# Patient Record
Sex: Female | Born: 1935 | Race: White | Hispanic: No | Marital: Married | Smoking: Former smoker
Health system: Southern US, Community
[De-identification: ages and names within clinical notes are randomized; demographics above are authoritative.]

## PROBLEM LIST (undated history)

## (undated) DIAGNOSIS — H269 Unspecified cataract: Secondary | ICD-10-CM

## (undated) DIAGNOSIS — I10 Essential (primary) hypertension: Secondary | ICD-10-CM

## (undated) DIAGNOSIS — I422 Other hypertrophic cardiomyopathy: Secondary | ICD-10-CM

## (undated) DIAGNOSIS — I6529 Occlusion and stenosis of unspecified carotid artery: Secondary | ICD-10-CM

## (undated) DIAGNOSIS — I480 Paroxysmal atrial fibrillation: Secondary | ICD-10-CM

## (undated) DIAGNOSIS — Z9981 Dependence on supplemental oxygen: Secondary | ICD-10-CM

## (undated) DIAGNOSIS — R011 Cardiac murmur, unspecified: Secondary | ICD-10-CM

## (undated) DIAGNOSIS — C4359 Malignant melanoma of other part of trunk: Secondary | ICD-10-CM

## (undated) DIAGNOSIS — I4891 Unspecified atrial fibrillation: Secondary | ICD-10-CM

## (undated) DIAGNOSIS — I509 Heart failure, unspecified: Secondary | ICD-10-CM

## (undated) DIAGNOSIS — I209 Angina pectoris, unspecified: Secondary | ICD-10-CM

## (undated) DIAGNOSIS — R0789 Other chest pain: Secondary | ICD-10-CM

## (undated) DIAGNOSIS — E039 Hypothyroidism, unspecified: Secondary | ICD-10-CM

## (undated) DIAGNOSIS — I499 Cardiac arrhythmia, unspecified: Secondary | ICD-10-CM

## (undated) DIAGNOSIS — I5033 Acute on chronic diastolic (congestive) heart failure: Secondary | ICD-10-CM

## (undated) DIAGNOSIS — I639 Cerebral infarction, unspecified: Secondary | ICD-10-CM

## (undated) DIAGNOSIS — R001 Bradycardia, unspecified: Secondary | ICD-10-CM

## (undated) DIAGNOSIS — K449 Diaphragmatic hernia without obstruction or gangrene: Secondary | ICD-10-CM

## (undated) DIAGNOSIS — E785 Hyperlipidemia, unspecified: Secondary | ICD-10-CM

## (undated) DIAGNOSIS — R0602 Shortness of breath: Secondary | ICD-10-CM

## (undated) DIAGNOSIS — J42 Unspecified chronic bronchitis: Secondary | ICD-10-CM

## (undated) DIAGNOSIS — K219 Gastro-esophageal reflux disease without esophagitis: Secondary | ICD-10-CM

## (undated) DIAGNOSIS — R42 Dizziness and giddiness: Secondary | ICD-10-CM

## (undated) DIAGNOSIS — T8859XA Other complications of anesthesia, initial encounter: Secondary | ICD-10-CM

## (undated) DIAGNOSIS — Z950 Presence of cardiac pacemaker: Secondary | ICD-10-CM

## (undated) DIAGNOSIS — T4145XA Adverse effect of unspecified anesthetic, initial encounter: Secondary | ICD-10-CM

## (undated) DIAGNOSIS — I2510 Atherosclerotic heart disease of native coronary artery without angina pectoris: Secondary | ICD-10-CM

## (undated) DIAGNOSIS — J189 Pneumonia, unspecified organism: Secondary | ICD-10-CM

## (undated) DIAGNOSIS — M2550 Pain in unspecified joint: Secondary | ICD-10-CM

## (undated) DIAGNOSIS — F40240 Claustrophobia: Secondary | ICD-10-CM

## (undated) DIAGNOSIS — I739 Peripheral vascular disease, unspecified: Secondary | ICD-10-CM

## (undated) DIAGNOSIS — M1990 Unspecified osteoarthritis, unspecified site: Secondary | ICD-10-CM

## (undated) HISTORY — DX: Essential (primary) hypertension: I10

## (undated) HISTORY — DX: Unspecified cataract: H26.9

## (undated) HISTORY — DX: Other chest pain: R07.89

## (undated) HISTORY — DX: Dizziness and giddiness: R42

## (undated) HISTORY — DX: Peripheral vascular disease, unspecified: I73.9

## (undated) HISTORY — DX: Occlusion and stenosis of unspecified carotid artery: I65.29

## (undated) HISTORY — DX: Cardiac murmur, unspecified: R01.1

## (undated) HISTORY — PX: DILATION AND CURETTAGE OF UTERUS: SHX78

## (undated) HISTORY — PX: EYE SURGERY: SHX253

## (undated) HISTORY — DX: Hyperlipidemia, unspecified: E78.5

## (undated) HISTORY — DX: Unspecified osteoarthritis, unspecified site: M19.90

## (undated) HISTORY — DX: Heart failure, unspecified: I50.9

## (undated) HISTORY — DX: Pain in unspecified joint: M25.50

## (undated) HISTORY — PX: TONSILLECTOMY: SUR1361

## (undated) HISTORY — DX: Gastro-esophageal reflux disease without esophagitis: K21.9

## (undated) HISTORY — PX: MICROLARYNGOSCOPY WITH CO2 LASER AND EXCISION OF VOCAL CORD LESION: SHX5970

## (undated) HISTORY — DX: Diaphragmatic hernia without obstruction or gangrene: K44.9

---
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
## 1959-06-23 DIAGNOSIS — C4359 Malignant melanoma of other part of trunk: Secondary | ICD-10-CM

## 1959-06-23 HISTORY — PX: MELANOMA EXCISION: SHX5266

## 1959-06-23 HISTORY — DX: Malignant melanoma of other part of trunk: C43.59

## 1969-02-20 HISTORY — PX: ABDOMINAL HYSTERECTOMY: SHX81

## 1981-06-22 HISTORY — PX: MICROLARYNGOSCOPY WITH CO2 LASER AND EXCISION OF VOCAL CORD LESION: SHX5970

## 1989-02-20 HISTORY — PX: CAROTID ENDARTERECTOMY: SUR193

## 1989-06-22 HISTORY — PX: ABDOMINAL SURGERY: SHX537

## 1998-09-16 ENCOUNTER — Encounter: Payer: Self-pay | Admitting: Cardiology

## 1998-09-16 ENCOUNTER — Ambulatory Visit (HOSPITAL_COMMUNITY): Admission: RE | Admit: 1998-09-16 | Discharge: 1998-09-16 | Payer: Self-pay | Admitting: Cardiology

## 1998-09-23 ENCOUNTER — Observation Stay (HOSPITAL_COMMUNITY): Admission: AD | Admit: 1998-09-23 | Discharge: 1998-09-24 | Payer: Self-pay | Admitting: Cardiology

## 1998-10-01 ENCOUNTER — Inpatient Hospital Stay (HOSPITAL_COMMUNITY): Admission: RE | Admit: 1998-10-01 | Discharge: 1998-10-03 | Payer: Self-pay | Admitting: *Deleted

## 1999-02-21 HISTORY — PX: CATARACT EXTRACTION W/ INTRAOCULAR LENS  IMPLANT, BILATERAL: SHX1307

## 2000-09-30 ENCOUNTER — Ambulatory Visit (HOSPITAL_COMMUNITY): Admission: RE | Admit: 2000-09-30 | Discharge: 2000-09-30 | Payer: Self-pay | Admitting: Gastroenterology

## 2002-10-04 ENCOUNTER — Encounter: Payer: Self-pay | Admitting: Cardiology

## 2002-10-04 ENCOUNTER — Encounter: Admission: RE | Admit: 2002-10-04 | Discharge: 2002-10-04 | Payer: Self-pay | Admitting: Cardiology

## 2002-10-09 ENCOUNTER — Ambulatory Visit (HOSPITAL_COMMUNITY): Admission: RE | Admit: 2002-10-09 | Discharge: 2002-10-10 | Payer: Self-pay | Admitting: Cardiology

## 2002-10-23 ENCOUNTER — Ambulatory Visit (HOSPITAL_COMMUNITY): Admission: RE | Admit: 2002-10-23 | Discharge: 2002-10-23 | Payer: Self-pay | Admitting: Cardiology

## 2002-11-22 ENCOUNTER — Ambulatory Visit (HOSPITAL_COMMUNITY): Admission: RE | Admit: 2002-11-22 | Discharge: 2002-11-22 | Payer: Self-pay | Admitting: Cardiology

## 2002-11-22 ENCOUNTER — Encounter: Payer: Self-pay | Admitting: Cardiology

## 2003-12-20 ENCOUNTER — Ambulatory Visit (HOSPITAL_COMMUNITY): Admission: RE | Admit: 2003-12-20 | Discharge: 2003-12-20 | Payer: Self-pay | Admitting: Gastroenterology

## 2004-11-03 ENCOUNTER — Encounter: Admission: RE | Admit: 2004-11-03 | Discharge: 2004-11-03 | Payer: Self-pay | Admitting: Cardiology

## 2004-11-07 ENCOUNTER — Ambulatory Visit (HOSPITAL_COMMUNITY): Admission: RE | Admit: 2004-11-07 | Discharge: 2004-11-07 | Payer: Self-pay | Admitting: Cardiology

## 2007-02-10 ENCOUNTER — Ambulatory Visit: Payer: Self-pay | Admitting: *Deleted

## 2007-09-13 HISTORY — PX: CARDIOVASCULAR STRESS TEST: SHX262

## 2008-02-09 ENCOUNTER — Ambulatory Visit: Payer: Self-pay | Admitting: *Deleted

## 2008-02-09 DIAGNOSIS — R0789 Other chest pain: Secondary | ICD-10-CM

## 2008-02-09 HISTORY — DX: Other chest pain: R07.89

## 2008-06-13 ENCOUNTER — Ambulatory Visit (HOSPITAL_COMMUNITY): Admission: RE | Admit: 2008-06-13 | Discharge: 2008-06-13 | Payer: Self-pay | Admitting: Emergency Medicine

## 2008-06-13 ENCOUNTER — Encounter: Payer: Self-pay | Admitting: Emergency Medicine

## 2008-06-13 ENCOUNTER — Ambulatory Visit: Payer: Self-pay | Admitting: *Deleted

## 2008-11-20 ENCOUNTER — Encounter: Admission: RE | Admit: 2008-11-20 | Discharge: 2008-11-20 | Payer: Self-pay | Admitting: Cardiology

## 2008-12-04 ENCOUNTER — Inpatient Hospital Stay (HOSPITAL_COMMUNITY): Admission: RE | Admit: 2008-12-04 | Discharge: 2008-12-05 | Payer: Self-pay | Admitting: Cardiology

## 2009-01-09 ENCOUNTER — Encounter (HOSPITAL_COMMUNITY): Admission: RE | Admit: 2009-01-09 | Discharge: 2009-03-21 | Payer: Self-pay | Admitting: Cardiology

## 2009-02-14 ENCOUNTER — Ambulatory Visit: Payer: Self-pay | Admitting: Vascular Surgery

## 2009-10-14 ENCOUNTER — Ambulatory Visit: Payer: Self-pay | Admitting: Family Medicine

## 2009-10-14 ENCOUNTER — Ambulatory Visit: Payer: Self-pay | Admitting: Cardiology

## 2009-10-14 ENCOUNTER — Encounter: Payer: Self-pay | Admitting: Family Medicine

## 2009-10-14 ENCOUNTER — Inpatient Hospital Stay (HOSPITAL_COMMUNITY): Admission: EM | Admit: 2009-10-14 | Discharge: 2009-10-17 | Payer: Self-pay | Admitting: Emergency Medicine

## 2009-10-16 ENCOUNTER — Encounter: Payer: Self-pay | Admitting: Family Medicine

## 2009-10-16 ENCOUNTER — Encounter (INDEPENDENT_AMBULATORY_CARE_PROVIDER_SITE_OTHER): Payer: Self-pay | Admitting: Cardiology

## 2009-10-16 ENCOUNTER — Ambulatory Visit: Payer: Self-pay | Admitting: Vascular Surgery

## 2009-10-21 ENCOUNTER — Encounter: Payer: Self-pay | Admitting: *Deleted

## 2010-04-04 ENCOUNTER — Ambulatory Visit: Payer: Self-pay | Admitting: Vascular Surgery

## 2010-04-21 ENCOUNTER — Encounter: Admission: RE | Admit: 2010-04-21 | Discharge: 2010-04-21 | Payer: Self-pay | Admitting: Cardiology

## 2010-06-22 DIAGNOSIS — I639 Cerebral infarction, unspecified: Secondary | ICD-10-CM

## 2010-06-22 HISTORY — DX: Cerebral infarction, unspecified: I63.9

## 2010-07-24 NOTE — Miscellaneous (Signed)
Summary: hosp f/u  Clinical Lists Changes LM for her or family to call me back.Golden Circle RN  Oct 21, 2009 4:26 PM  spoke with spouse. she is doing "fine" has had 2 appts with md since discharge. no need for one here.Golden Circle RN  Oct 31, 2009 10:37 AM

## 2010-09-09 LAB — BASIC METABOLIC PANEL
BUN: 27 mg/dL — ABNORMAL HIGH (ref 6–23)
CO2: 25 mEq/L (ref 19–32)
Calcium: 8.9 mg/dL (ref 8.4–10.5)
Chloride: 104 mEq/L (ref 96–112)
Chloride: 104 mEq/L (ref 96–112)
Chloride: 107 mEq/L (ref 96–112)
GFR calc Af Amer: 39 mL/min — ABNORMAL LOW (ref >60)
GFR calc Af Amer: 40 mL/min — ABNORMAL LOW (ref >60)
GFR calc Af Amer: 50 mL/min — ABNORMAL LOW (ref >60)
GFR calc non Af Amer: 32 mL/min — ABNORMAL LOW (ref >60)
GFR calc non Af Amer: 41 mL/min — ABNORMAL LOW (ref >60)
Glucose, Bld: 97 mg/dL (ref 70–99)
Glucose, Bld: 98 mg/dL (ref 70–99)
Potassium: 5.3 mEq/L — ABNORMAL HIGH (ref 3.5–5.1)
Sodium: 136 mEq/L (ref 135–145)
Sodium: 136 mEq/L (ref 135–145)

## 2010-09-09 LAB — LIPID PANEL
Cholesterol: 247 mg/dL — ABNORMAL HIGH (ref 0–200)
HDL: 37 mg/dL — ABNORMAL LOW (ref >39)
Total CHOL/HDL Ratio: 6.7 RATIO
VLDL: 54 mg/dL — ABNORMAL HIGH (ref 0–40)

## 2010-09-09 LAB — CBC
HCT: 36.7 % (ref 36.0–46.0)
Hemoglobin: 11.8 g/dL — ABNORMAL LOW (ref 12.0–15.0)
Hemoglobin: 12.5 g/dL (ref 12.0–15.0)
MCHC: 34 g/dL (ref 30.0–36.0)
MCHC: 34.1 g/dL (ref 30.0–36.0)
MCV: 92.7 fL (ref 78.0–100.0)
Platelets: 180 10*3/uL (ref 150–400)
RBC: 3.73 MIL/uL — ABNORMAL LOW (ref 3.87–5.11)
RBC: 4.05 MIL/uL (ref 3.87–5.11)
RDW: 14.5 % (ref 11.5–15.5)
RDW: 14.8 % (ref 11.5–15.5)
RDW: 15 % (ref 11.5–15.5)
WBC: 6.3 10*3/uL (ref 4.0–10.5)

## 2010-09-09 LAB — URINALYSIS, ROUTINE W REFLEX MICROSCOPIC
Bilirubin Urine: NEGATIVE
Hgb urine dipstick: NEGATIVE
Ketones, ur: NEGATIVE mg/dL
Nitrite: NEGATIVE
Urobilinogen, UA: 0.2 mg/dL (ref 0.0–1.0)

## 2010-09-09 LAB — POCT CARDIAC MARKERS
CKMB, poc: 1.5 ng/mL (ref 1.0–8.0)
Myoglobin, poc: 93.1 ng/mL (ref 12–200)

## 2010-09-09 LAB — HEMOGLOBIN A1C: Hgb A1c MFr Bld: 5.9 % — ABNORMAL HIGH (ref <5.7)

## 2010-09-29 LAB — CARDIAC PANEL(CRET KIN+CKTOT+MB+TROPI)
CK, MB: 2.3 ng/mL (ref 0.3–4.0)
Relative Index: INVALID (ref 0.0–2.5)
Total CK: 63 U/L (ref 7–177)

## 2010-09-29 LAB — CBC
MCHC: 33 g/dL (ref 30.0–36.0)
MCV: 91.9 fL (ref 78.0–100.0)
RDW: 14.5 % (ref 11.5–15.5)
WBC: 6.6 10*3/uL (ref 4.0–10.5)

## 2010-09-29 LAB — BASIC METABOLIC PANEL
CO2: 30 mEq/L (ref 19–32)
Chloride: 107 mEq/L (ref 96–112)
Creatinine, Ser: 1.07 mg/dL (ref 0.4–1.2)
GFR calc Af Amer: >60 mL/min (ref >60)
Sodium: 141 mEq/L (ref 135–145)

## 2010-11-04 NOTE — Cardiovascular Report (Signed)
NAMESHARMEL, BALLANTINE            ACCOUNT NO.:  [id number]   MEDICAL RECORD NO.:  [id number]          PATIENT TYPE:  INP   LOCATION:  2506                         FACILITY:  [facility]   PHYSICIAN:  Thereasa Solo. Little, M.D. DATE OF BIRTH:  June 23, 1927   DATE OF PROCEDURE:  12/04/2008  DATE OF DISCHARGE:                            CARDIAC CATHETERIZATION   INDICATIONS FOR TEST:  This 77 year old female has known coronary artery  disease.  She had a stent placed to her diagonal and 1 to her circumflex  in the remote past.  She has a normal ejection fraction, had a nuclear  study in March 2009 that showed inferior lateral ischemia and she was  asymptomatic.  Unfortunately, she is now developed chest pain and  worsening resting EKG changes.  She is brought in for outpatient cardiac  catheterization.   Her chest pain is more, upper back discomfort that runs between the  scapula, it usually occurs at rest and late in the evening, it is not  associated with diaphoresis.  There is sometimes shortness of breath  with this and I am not convinced this is an anginal equivalent, but with  the ischemic changes on the nuclear study and her changes on her  cardiogram, I brought her in for cath.   After obtaining informed consent, the patient was prepped and draped in  the usual sterile fashion exposing the right groin.  Following local  anesthetic with 1% Xylocaine, the Seldinger technique was employed, and  a 5-French introducer sheath was placed in the right femoral artery.  A  SmartNeedle was used for arterial access.  Left and right coronary  arteriography and ventriculography was performed in the RAO projection.  Following this, intervention to 3 different areas was performed.   COMPLICATIONS:  None.   TOTAL CONTRAST:  315 mL   MEDICATIONS:  IV Angiomax with an ACT of 396 prior to starting  intervention and Versed 2 mg IV.   RESULTS:  1. Hemodynamic monitoring:  Her central aortic pressure was  163/57.      Her left ventricular pressure was 167/10.  The left ventricular end-      diastolic pressure was 28.  2. Ventriculography:  Ventriculography in the RAO projection using 20      mL of contrast at 12 mL per second revealed the left ventricle to      be hyperdynamic with an ejection fraction excess of 75%, no mitral      regurgitation was appreciated.  3. Coronary arteriography:  On fluoroscopy, calcification was seen in      the aortic root in the LAD and the left main and there was dense      mitral annular calcification.      a.     Left main normal and bifurcated.      b.     Circumflex.  The circumflex was a large dominant vessel.  It       gave rise to a PDA and multiple OM vessels.  In the proximal       portion of the circumflex was a stent, within this stented  area       was a hazy area that probably represented a plaque rupture with       some thrombus.  There was only a 40% area of narrowing, but the       haziness was of significant concern.  The ostium of the OM #2,       which was slightly distal to the stent had an 80% area of       narrowing and the vessel was about 1.5 mm in diameter.  The       remainder of the circ system had only mild irregularities.      c.     LAD.  The LAD extended to towards the apex of the heart.  It       had only minimal irregularities.  The first diagonal was a small       vessel about 2 mm.  There was a stent in the midportion of this       vessel that was widely patent.  The ostium of the diagonal,       however, was approximately 70% narrowed and it was very difficult       to adequately visualize this ostial segment.      d.     Right coronary artery.  The right coronary artery was a       nondominant vessel, 100% occluded in its midportion, and this is       unchanged from prior cath.   Arrangements were made for intervention.  She was given IV Angiomax  access obtained an ACT of 396.  The introducer sheath was upgraded to a   6-French system and a JL3.5, 6-French guide, and a Luge wire was used.  1. Cutting balloon to in-stent stenosis in the circumflex stented      area.  A 2.5 x 10 cutting balloon was placed within the body of the      stent, making sure that it did not extend past the stent border.  A      total of 3 inflations 12 x 45, 12 x 46, and 12 x 49 was performed.      This area appeared to be normal post PCI and there was no evidence      of any dissection or distal embolization or thrombus formation.  2. PCI to the ostium of OM #2.  There was 80% narrowing.  A 1.5 x 12      apex balloon was used.  Two inflations 8 x 47 and 9 x 48 was      performed.  This area was less than 20% narrowed post PCI.  I did      not have a cutting balloon small enough.  Therefore, the apex was      used.  3. PCI to the ostium of the first diagonal, which was 70% narrowed.  A      2.0 x 12 cutting balloon was used.  Three inflations 8 x 30, 8 x      50, and 8 x 31 was performed.  The area was less than 20% narrowed      post intervention.  There was no evidence of any dissection or      thrombus formation, and the LAD around the ostium appeared to be      normal also.   I gave the patient 200 mcg of intracoronary nitroglycerin following the  procedure.  I plan to hydrate  her overnight.  Her sheath will be removed  later today.  I plan to check a BMP in the morning.  Her blood pressure  at the end of the procedure was 190/82 and I started her on IV  nitroglycerin for blood pressure control.           ______________________________  Thereasa Solo. Little, M.D.     ABL/MEDQ  D:  12/04/2008  T:  12/05/2008  Job:  [id number]   cc:   Cath Lab  Harrel Lemon. Merla Riches, M.D.

## 2010-11-04 NOTE — Procedures (Signed)
CAROTID DUPLEX EXAM   INDICATION:  Follow up carotid artery disease.   HISTORY:  Diabetes:  No.  Cardiac:  CHF, PTCA and stent in 2004.  Hypertension:  Yes.  Smoking:  No.  Previous Surgery:  A left CEA with DPA on 09/30/2009 by Dr. Madilyn Fireman.  CV History:  CVA 10/13/2009.  Amaurosis Fugax , Paresthesias , Hemiparesis                                       RIGHT             LEFT  Brachial systolic pressure:         162               164  Brachial Doppler waveforms:         WNL               WNL  Vertebral direction of flow:        Antegrade         Antegrade  DUPLEX VELOCITIES (cm/sec)  CCA peak systolic                   66                96  ECA peak systolic                   226               96  ICA peak systolic                   167               93  ICA end diastolic                   32                21  PLAQUE MORPHOLOGY:                  Heterogenous      Focal plaquing of  the proximal ICA  PLAQUE AMOUNT:                      Moderate          Mild  PLAQUE LOCATION:                    ICA, ECA, CCA     CCA   IMPRESSION:  1. Right internal carotid artery shows evidence of 40% to 59%      stenosis.  2. Left internal carotid artery shows evidence of 1% to 39% stenosis.   ___________________________________________  Di Kindle. Edilia Bo, M.D.   OD/MEDQ  D:  04/04/2010  T:  04/04/2010  Job:  [id number]

## 2010-11-04 NOTE — Procedures (Signed)
CAROTID DUPLEX EXAM   INDICATION:  Followup carotid artery disease.   HISTORY:  Diabetes:  no  Cardiac:  CHF, PTCA and stent in 2004  Hypertension:  yes  Smoking:  no  Previous Surgery:  Left CEA with DPA 10/01/1998 by Dr. Madilyn Fireman  CV History:  Rarely vertigo, asymptomatic now  Amaurosis Fugax  No, Paresthesias No, Hemiparesis No                                       RIGHT             LEFT  Brachial systolic pressure:         158               160  Brachial Doppler waveforms:         Within normal limits                Within normal limits  Vertebral direction of flow:        antegrade         antegrade  DUPLEX VELOCITIES (cm/sec)  CCA peak systolic                   70                156  ECA peak systolic                   280               165  ICA peak systolic                   197               89  ICA end diastolic                   22                23  PLAQUE MORPHOLOGY:                  Calcific with shadow                homogenous  PLAQUE AMOUNT:                      moderate          mild  PLAQUE LOCATION:                    ICA/ ECA/ CCA     Distal CCA   IMPRESSION:  1. Right internal carotid artery shows evidence of 40 to 59% stenosis      (high end of range).  2. Left internal carotid artery shows evidence of 1 to 39% stenosis.  3. Right external carotid artery stenosis.  4. No significant changes from previous study.   ___________________________________________  Di Kindle. Edilia Bo, M.D.   AS/MEDQ  D:  02/14/2009  T:  02/14/2009  Job:  [id number]

## 2010-11-04 NOTE — Discharge Summary (Signed)
Carla Black, Carla Black            ACCOUNT NO.:  [id number]   MEDICAL RECORD NO.:  [id number]          PATIENT TYPE:  INP   LOCATION:  2506                         FACILITY:  [facility]   PHYSICIAN:  Thereasa Solo. Little, M.D. DATE OF BIRTH:  December 07, 1927   DATE OF ADMISSION:  12/04/2008  DATE OF DISCHARGE:  12/05/2008                               DISCHARGE SUMMARY   DISCHARGE DIAGNOSES:  1. Angina occurring over the last 4 weeks with a mildly positive      stress test in March 2009 but this was the first time of her      angina.  2. Coronary artery disease with history of stent to the diagonal and      circumflex.      a.     Cutting balloon for in-stent restenosis to the circumflex.      b.     Angioplasty to the ostium of the obtuse marginal #2.      c.     Angioplasty to the ostium of the first diagonal.  3. Chronic dyspnea on exertion.  4. Hyperlipidemia.  5. Chronic lower extremity edema.  6. Difficulty with statins but with elevated LDL and continued      disease.  We will change to Crestor for better control.  7. Hypothyroidism, on replacement.   DISCHARGE CONDITION:  Improved.   PROCEDURES:  1. Combined left heart cath on December 04, 2008, by Dr. Clarene Duke.  2. December 04, 2008, cutting balloon for in-stent restenosis to the      circumflex, angioplasty of the ostium of the OM-2, angioplasty      ostium of the first diagonal by Dr. Clarene Duke.   DISCHARGE MEDICATIONS:  1. Plavix 75 mg daily.  Do not stop Plavix for any GI procedure for at      least 90 days.  2. Omeprazole 20 mg daily.  3. Quinapril, decrease dose to 10 mg daily.  4. Synthroid 100 mg daily.  5. Isosorbide mononitrate 60 mg daily.  6. Stop Zocor and instead use Crestor 20 mg daily.  7. Tiazac 360 mg daily.  8. Toprol, increase the dose to 75 mg daily.  9. Aspirin 81 mg, take 2 daily.  10.Lasix 60 mg as needed as before.  11.Potassium 20 mEq as needed as before.  12.Nitroglycerin sublingual as needed for chest pain 1  every 5 minutes      up to 3 every 15, if no relief call 911.   DISCHARGE INSTRUCTIONS:  1. Low-sodium, heart-healthy diet.  2. Wash cath site with soap and water.  Call if any bleeding,      swelling, or drainage.  3. Increase activity slowly.  No lifting for 1 week.  4. No driving for 3 days.  Follow up with Dr. Clarene Duke, December 26, 2008, at      11:20 a.m.   HISTORY OF PRESENT ILLNESS:  A 77 year old female with history of stents  in the past and a mildly abnormal stress test in March 2009.  Once that  was asymptomatic in 2009, began developing upper back pain running  across both scapula occurring late  in the evening, usually at rest.  It  also radiates down her left arm.  No diaphoresis.  Some shortness of  breath associated with it.  Lasts 5-10 minutes.  Also with her normal  dyspnea on exertion, it has increased somewhat over the last 4 weeks.  It was decided by Dr. Clarene Duke to go ahead and proceed with cardiac  catheterization for evaluation as her EKG had some ST-T wave  abnormalities in the inferolateral leads as well.  She underwent cardiac  catheterization and was found to have in-stent restenosis of the  circumflex of 80% as well as ostium OM-2 undergoing procedures as  stated.   By the next morning, the patient was stable, ambulating in the hall  without difficulty.  Her lipid panel LDL was 95, we want her LDL to be  70 or less.  Therefore, her Zocor will be changed to Crestor.  We also  adjusted blood pressure medications as well.  As her heart rate was 81  here in the hospital, blood pressure was slightly elevated as well.  She  will follow up with Dr. Clarene Duke as stated.   LABORATORY DATA:  Postprocedure, hemoglobin 12.6, BUN 16, creatinine  1.07.  CK-MBs were negative.  Troponin 0.04.  EKG shows left ventricular  hypertrophy.   Blood pressure 152/48, pulse 71, respiratory rate 24, temperature 98,  oxygen saturation on room air was 93%.  No chest pain.  She felt well.   Heart regular rate and rhythm without murmur.  Lungs were clear without  rales, rhonchi, or wheezes.  Cath site was stable without hematoma.      Carla Black, N.P.    ______________________________  Thereasa Solo Little, M.D.    LRI/MEDQ  D:  12/05/2008  T:  12/06/2008  Job:  [id number]   cc:   Harrel Lemon. Merla Riches, M.D.

## 2010-11-04 NOTE — Procedures (Signed)
CAROTID DUPLEX EXAM   INDICATION:  Bilateral carotid artery disease.   HISTORY:  Diabetes:  No  Cardiac:  Coronary artery disease, PTCA and stent x2 in April, 2004, CHF  Hypertension:  Yes  Smoking:  Quit  Previous Surgery:  Left CEA with DPA in April, 2000  CV History:  No  Amaurosis Fugax No, Paresthesias No, Hemiparesis No                                       RIGHT             LEFT  Brachial systolic pressure:         165               166  Brachial Doppler waveforms:         Triphasic         Triphasic  Vertebral direction of flow:        Antegrade         Antegrade  DUPLEX VELOCITIES (cm/sec)  CCA peak systolic                   88                101  ECA peak systolic                   245               120  ICA peak systolic                   195               85  ICA end diastolic                   29                20  PLAQUE MORPHOLOGY:                  Calcified with shadow               Homogenous  PLAQUE AMOUNT:                      Moderate          Mild  PLAQUE LOCATION:                    Bifurcation/ICA   CCA/ICA   IMPRESSION:  The right ICA shows evidence of 40% to 59% stenosis (high  end of range).  Categorically the right ICA dropped from the low end of 60% to 79% to  high end of 40% to 59% range due to criteria change, however velocities  show no significant change.  The left ICA shows evidence of 1% to 39% stenosis.  The right ECA is stenotic.   ___________________________________________  P. Liliane Bade, M.D.   AS/MEDQ  D:  02/10/2007  T:  02/11/2007  Job:  [id number]

## 2010-11-07 NOTE — Cardiovascular Report (Signed)
NAME:  Carla Black, Carla Black                      ACCOUNT NO.:  [id number]   MEDICAL RECORD NO.:  [id number]                   PATIENT TYPE:  OIB   LOCATION:  2855                                 FACILITY:  [facility]   PHYSICIAN:  Thereasa Solo. Little, M.D.              DATE OF BIRTH:  07-02-1927   DATE OF PROCEDURE:  10/23/2002  DATE OF DISCHARGE:                              CARDIAC CATHETERIZATION   INDICATIONS FOR TEST:  The patient is a 77 year old female who underwent  stenting on October 09, 2002 to her diagonal with a small Pixel stent and to  her circumflex with a large TAXUS stent.  She presented to my office on  Thursday, five days prior to this, with the complaints of upper back and  shoulder pain that seems responsive to nitroglycerin.  Her EKG did not show  substantial changes, but because of this recurrent discomfort responsive to  nitroglycerin, she is brought in for repeat cardiac catheterization.   DESCRIPTION OF PROCEDURE:  The patient was prepped and draped in the usual  sterile fashion, exposing the right groin.  After applying local anesthetic  with 1% Xylocaine, the Seldinger technique was employed and a 5-French  introducer sheath was placed into the right femoral artery.  Left and right  coronary arteriography was performed.  Ventriculography was not performed.   RESULTS:  1. Central aortic pressure 157/66.  2. Coronary arteriography:  On fluoroscopy, calcification was seen in the     LAD and stents were visualized on fluoroscopy in both the LAD and     circumflex distribution.   a.  Left main:  Normal.   b.  LAD:  The LAD extended down to the apex of the heart and the distal  portion of the LAD was relatively small in diameter without high-grade  stenoses.  The proximal LAD where the calcification was noted had 30% and  40% areas of irregularity.  The first diagonal had a patent stent in its  proximal segment with brisk TIMI-3 flow and there was no evidence of  any  dissection or thrombus in this stent.  The first septal perforator was  subtotaled at its ostium and this is unchanged from her October 09, 2002 cath.   c.  Circumflex:  The circumflex was a large dominant vessel at about 3.75 mm  in diameter.  There were four OM branches, plus a PDA.  The stent in the  midportion of the circumflex was widely patent.  There was brisk TIMI-3 flow  with no evidence of dissection or thrombus formation.  The PDA and OM #4 and  OM #3 were widely patent.  OM #1 had mild irregularities, as it did on October 09, 2002.  OM #2 came off within the stent itself and had 50% area of ostial  narrowing and is not different from the cath on October 09, 2002.   d.  Right coronary artery:  Right  coronary artery was a nondominant vessel  that was basically subtotaled in its midportion with slipped collaterals  filling the distal portion of the RCA.   Because of her cardiac catheterization on October 09, 2002, and to conserve  contrast exposures, a ventriculogram was not performed, but her LV showed  normal systolic function with LVH on October 09, 2002.   CONCLUSION:  After reviewing the films and comparing them to the October 09, 2002 catheterization, I do not see significant changes and clearly nothing  that needs to be intervened upon.  Potential areas of concern are the septal  perforator, which has significant ostial narrowing, and the right coronary  artery, which is a nondominant vessel, all of which could be source of  angina.  There is clearly nothing that needed to be intervened upon and we  will continue medical therapy.                                               Thereasa Solo. Little, M.D.    ABL/MEDQ  D:  10/23/2002  T:  10/23/2002  Job:  [id number]   cc:   [facility]   Tracey Harries, M.D.  [address]  [place]  [place] [address]  Fax: [phone]

## 2010-11-07 NOTE — Cardiovascular Report (Signed)
NAMEALIANAH, Carla Black            ACCOUNT NO.:  [id number]   MEDICAL RECORD NO.:  [id number]          PATIENT TYPE:  OIB   LOCATION:  2899                         FACILITY:  [facility]   PHYSICIAN:  Thereasa Solo. Little, M.D. DATE OF BIRTH:  June 12, 1928   DATE OF PROCEDURE:  11/07/2004  DATE OF DISCHARGE:                              CARDIAC CATHETERIZATION   INDICATIONS FOR TEST:  Ms. Placide is 77 years old. She has stents in her  circumflex and in her diagonal vessel placed April of 2004. She began having  anginal-type pain and a nuclear study was performed that showed mild lateral  ischemia and because of this, was brought in for outpatient cardiac  catheterization.   DESCRIPTION OF PROCEDURE:  After obtaining informed consent the patient was  prepped and draped in the usual sterile fashion exposing the right groin.  Following local anesthetic of 1% Xylocaine, the Seldinger technique was  employed and a 5-French introducer sheath was placed in the right femoral  artery. Left and right coronary arteriography and ventriculography in the  RAO projection was performed.   COMPLICATIONS:  None.   EQUIPMENT:  Five-French Judkins configuration catheters. However, a 3.5, 5-  [place] RCA catheter was used to cannulate the right coronary artery.   TOTAL CONTRAST USED:  130 mL.   RESULTS:  A. Hemodynamic monitoring central aortic pressure was 168/24. Left  ventricular pressure was 169/24. No significant gradient was noted at the  time of pullback.  B. Ventriculography: Ventriculography in the RAO projection revealed normal  to hyperdynamic LV systolic function with ejection fraction in excess of  65%. There was dense mitral annular calcification but no mitral  regurgitation was seen. The left ventricular end-diastolic pressure was  elevated at 24-26.  C. Coronary arteriography: On fluoroscopy, calcification was seen in the  distribution of left main LAD and circumflex.  1.  Left main: There  were mild irregularities and it bifurcated.  2.  LAD: The LAD extended down to the apex of the heart and had mild      irregularities. There was a first diagonal branch that had a stent in      the proximal portion of it. The stent in the distal diagonal was free of      disease. There was mild narrowing proximal to and involving the ostium      of this diagonal.  3.  Circumflex: The circumflex was a dominant vessel which gave rise to      large PDA and four OM vessels. There was stent in the mid portion of the      circumflex that overlaps the ostium of OM-2. There were mild      irregularities and a small OM-1 and OM-2 with insignificant disease in      the other vessels.  4.  Right coronary artery. This was a small nondominant vessel with 100%      occlusion in its mid portion, skipped collaterals, and was unchanged      from prior cast.  5.  Ostial diagonal. There is a vessel that comes off almost at the  bifurcation of the circumflex LAD. It appears, however, to be slightly      more into the more proximal portion of the LAD. It is a small vessel in      diameter, although it appears to be relatively long. It has ostial 80%      narrowing. I suspect this is the source of her pain and is the source of      the positive nuclear study. Because of its location so proximal to the      left main, it is extremely high risk for any type of percutaneous      intervention. The decision has been made to manage this medically and if      she fails medical therapy, I would try to fix this was a cutting      balloon. That would be an extremely high risk procedure and CVTS would      have to be aware before I would launch into any type of intervention.   PLAN:  At this point, I have increased her Imdur to 60 mg, her Toprol to 75  mg a day, and added Plavix 75 mg a day. I plan to reevaluate her in the  office on Monday.      ABL/MEDQ  D:  11/07/2004  T:  11/07/2004  Job:  [id number]   cc:    Tracey Harries, M.D.  [address]  [place]  [place] [address]  Fax: [phone]

## 2010-11-07 NOTE — Cardiovascular Report (Signed)
NAME:  Carla Black, Carla Black                      ACCOUNT NO.:  [id number]   MEDICAL RECORD NO.:  [id number]                   PATIENT TYPE:  OIB   LOCATION:  6531                                 FACILITY:  [facility]   PHYSICIAN:  Thereasa Solo. Little, M.D.              DATE OF BIRTH:  1927/09/29   DATE OF PROCEDURE:  10/09/2002  DATE OF DISCHARGE:  10/10/2002                              CARDIAC CATHETERIZATION   This is a redictation from October 09, 2002.   INDICATIONS FOR TEST:  The patient is a 77 year old female who has had  anginal complaints presenting for outpatient cardiac catheterization.   PROCEDURE:  The patient was prepped and draped in the usual sterile fashion  exposing the right groin.  Following local anesthetic 1% Xylocaine the  Seldinger technique was employed and the 5-French introducer sheath was  placed into the right femoral artery.  Left and right coronary  arteriography, ventriculography, and acute PCI was performed.   COMPLICATIONS:  None.   TOTAL CONTRAST:  350 mL.   EQUIPMENT:  5-French Judkins configuration catheters.   RESULTS:   HEMODYNAMIC MONITORING:  Central aortic pressure was 168/69.  Left  ventricular pressure was 168/15 with no significant gradient noted at time  of pullback.   VENTRICULOGRAPHY:  Ventriculography in the RAO projection revealed normal  left ventricular systolic function, no mitral regurgitation, left  ventricular end-diastolic pressure 26, ejection fraction greater than 55.   CORONARY ARTERIOGRAPHY:  Calcification was seen on fluoroscopy in the left  main and LAD distribution.  1. Left main normal.  2. LAD:  There was proximal irregularities in the LAD with the mid and     distal vessel being free of disease.  The first diagonal had a mid 80%     area of narrowing and was about a 2 mm vessel.  3. Circumflex:  The circumflex was a dominant vessel with three OMs and a     PDA.  Just after OM number 2 was a 70% eccentric area of  narrowing with     OMs and the PDA being free of disease.  4. Right coronary artery:  The right coronary artery was a nondominant     vessel and was 100% occluded.   CONCLUSION:  High grade stenosis in first diagonal and in the circumflex  with normal left ventricular systolic function.   Because of the above and her anatomy, arrangements were made for  intervention.  The 5-French sheath was exchanged out for a 6-French sheath.  A JL3.5 guide catheter, the short Luge, and a 2.0 x 10 CrossSail balloon was  used.  The first diagonal was initially approached.  It was predilated at 9  atmospheres for 65 seconds with a CrossSail balloon.  Following this a 2.0 x  8 Pixel stent was placed.  Initial deployment was 1 x62 with a final  deployment being 12 atmospheres for 58 seconds.  Following intervention the  area that had been 80% narrowed was now less than 10% narrowed with no  evidence of dissection or thrombus and brisk TIMI 3 flow.   __________ were then directed towards the circumflex.  It was 70% narrowed  preintervention.  A 3.0 x 16 Taxus stent was placed so that both the  proximal and distal portion of the lesion was covered.  Initial deployment  was 16 x 60 with the final deployment being 15 x 57.  Following primary  stenting the vessel appeared to be normal with no dissection or thrombus.                                               Thereasa Solo. Little, M.D.    ABL/MEDQ  D:  12/04/2002  T:  12/04/2002  Job:  [id number]  Tracey Harries, M.D.  [address]  [place]  [place] [address]  Fax: [phone]   cc:   Tracey Harries, M.D.  [address]  [place]  [place] [address]  Fax: [phone]

## 2010-11-07 NOTE — Procedures (Signed)
CAROTID DUPLEX EXAM   INDICATION:  Followup evaluation of known carotid artery disease.   HISTORY:  Diabetes:  No.  Cardiac:  PTCA and stent in April of 2004, congestive heart failure.  Hypertension:  Yes.  Smoking:  No.  Previous Surgery:  Left carotid endarterectomy with Dacron patch  angioplasty in April of 2000.  CV History:  The patient has a previous duplex on 02/10/2007.  Duplex  revealed 40-59% right ICA stenosis, 20-39% left ICA stenosis status post  endarterectomy.  The patient has had 2 episodes of vertigo in March of  2009 and June of 2009 both of which lasted 24 hours.  Amaurosis Fugax No, Paresthesias No, Hemiparesis No                                       RIGHT             LEFT  Brachial systolic pressure:         164               158  Brachial Doppler waveforms:         Triphasic         Triphasic  Vertebral direction of flow:        Antegrade         Antegrade  DUPLEX VELOCITIES (cm/sec)  CCA peak systolic                   62                89  ECA peak systolic                   209               62  ICA peak systolic                   190               61  ICA end diastolic                   48                23  PLAQUE MORPHOLOGY:                  Mixed irregular   Soft  PLAQUE AMOUNT:                      Mild to moderate  Mild  PLAQUE LOCATION:                    Proximal ICA      Distal CCA   IMPRESSION:  1. 40-59% right ICA stenosis.  2. 20-39% left ICA stenosis status post endarterectomy.     ___________________________________________  P. Liliane Bade, M.D.   MC/MEDQ  D:  02/10/2008  T:  02/10/2008  Job:  [id number]

## 2011-02-24 ENCOUNTER — Encounter: Payer: Self-pay | Admitting: Vascular Surgery

## 2011-03-11 ENCOUNTER — Other Ambulatory Visit: Payer: Self-pay | Admitting: Cardiology

## 2011-03-11 ENCOUNTER — Ambulatory Visit
Admission: RE | Admit: 2011-03-11 | Discharge: 2011-03-11 | Disposition: A | Discharge status: Home / Self Care | Payer: Medicare Other | Source: Ambulatory Visit | Attending: Cardiology | Admitting: Cardiology

## 2011-03-11 DIAGNOSIS — R0602 Shortness of breath: Secondary | ICD-10-CM

## 2011-03-13 ENCOUNTER — Observation Stay (HOSPITAL_COMMUNITY)
Admission: RE | Admit: 2011-03-13 | Discharge: 2011-03-14 | Disposition: A | Discharge status: Home / Self Care | Payer: Medicare Other | Source: Ambulatory Visit | Attending: Cardiology | Admitting: Cardiology

## 2011-03-13 DIAGNOSIS — Y9210 Unspecified residential institution as the place of occurrence of the external cause: Secondary | ICD-10-CM | POA: Insufficient documentation

## 2011-03-13 DIAGNOSIS — IMO0002 Reserved for concepts with insufficient information to code with codable children: Secondary | ICD-10-CM | POA: Insufficient documentation

## 2011-03-13 DIAGNOSIS — I2510 Atherosclerotic heart disease of native coronary artery without angina pectoris: Principal | ICD-10-CM | POA: Insufficient documentation

## 2011-03-13 DIAGNOSIS — E039 Hypothyroidism, unspecified: Secondary | ICD-10-CM | POA: Insufficient documentation

## 2011-03-13 DIAGNOSIS — Y849 Medical procedure, unspecified as the cause of abnormal reaction of the patient, or of later complication, without mention of misadventure at the time of the procedure: Secondary | ICD-10-CM | POA: Insufficient documentation

## 2011-03-13 DIAGNOSIS — I10 Essential (primary) hypertension: Secondary | ICD-10-CM | POA: Insufficient documentation

## 2011-03-13 DIAGNOSIS — Z9861 Coronary angioplasty status: Secondary | ICD-10-CM | POA: Insufficient documentation

## 2011-03-13 LAB — POCT ACTIVATED CLOTTING TIME
Activated Clotting Time: 237 seconds
Activated Clotting Time: 199 seconds

## 2011-03-14 HISTORY — PX: CARDIAC CATHETERIZATION: SHX172

## 2011-03-14 LAB — CBC
MCH: 30.4 pg (ref 26.0–34.0)
Platelets: 195 10*3/uL (ref 150–400)
RBC: 4.24 MIL/uL (ref 3.87–5.11)
RDW: 15.1 % (ref 11.5–15.5)

## 2011-03-14 LAB — BASIC METABOLIC PANEL
CO2: 28 mEq/L (ref 19–32)
Calcium: 9 mg/dL (ref 8.4–10.5)
GFR calc Af Amer: >60 mL/min (ref >60)
GFR calc non Af Amer: 56 mL/min — ABNORMAL LOW (ref >60)
Sodium: 141 mEq/L (ref 135–145)

## 2011-03-15 NOTE — Discharge Summary (Signed)
  Carla Black, Carla Black NO.:  [id number]  MEDICAL RECORD NO.:  [id number]  LOCATION:  3704                         FACILITY:  [facility]  PHYSICIAN:  Landry Corporal, MD DATE OF BIRTH:  [birth year]  DATE OF ADMISSION:  03/13/2011 DATE OF DISCHARGE:  03/14/2011                              DISCHARGE SUMMARY   DISCHARGE DIAGNOSES: 1. Small hematoma after cardiac catheterization, resolved. 2. Coronary artery disease with stable cardiac catheterization,     nonobstructive disease. 3. History of hypertension. 4. History of hypothyroidism.  DISCHARGE CONDITION:  Improved.  PROCEDURES:  Combined left heart cath on September 10, 2010, by Dr. Bryan Lemma.  HOSPITAL COURSE:  Carla Black came in electively to the short-stay unit for an outpatient cardiac catheterization, which was completed without complications.  Prior to her ambulation, she had a small hematoma developed at cath site.  Pressure was held and with almost complete resolution of hematoma, but it was late at night the site had been painful.  Therefore, we admitted her overnight to telemetry bed.  Kept pressure dressing and bed rest for 4 hours.  The next morning, she was stable.  The site was without hematoma.  It was all very soft, no bruit was detected.  She had no complaints and it was felt she was stable and ready for discharge home.  We did do followup labs, sodium 141, potassium 4.3, chloride 107, CO2 of 28, glucose 103, BUN 17, creatinine 0.96.  Hemoglobin 12.9, hematocrit 39.4, WBC 7.7, and platelets 195.  She ambulated in the hall without complications and then was discharged home.  DISCHARGE INSTRUCTIONS: 1. No lifting for 1 week, no driving for 2 days, may shower, increase     activity slowly. 2. Heart-healthy diet. 3. Wash cath site with soap and water.  If any swelling, bleeding,     drainage or pain, please call our office. 4. Follow up with Dr. Clarene Duke on October 5 at previously arranged  time.     Darcella Gasman. Annie Paras, N.P.   ______________________________ Landry Corporal, MD    LRI/MEDQ  D:  03/14/2011  T:  03/14/2011  Job:  [id number]  cc:   Dr. Clarene Duke  Electronically Signed by Nada Boozer N.P. on 03/15/2011 11:43:02 AM Electronically Signed by Bryan Lemma MD on 03/15/2011 12:14:27 PM

## 2011-03-16 NOTE — Cardiovascular Report (Signed)
[name], Black NO.:  [id number]  MEDICAL RECORD NO.:  [id number]  LOCATION:  3704                         FACILITY:  [facility]  PHYSICIAN:  Carla Corporal, MD DATE OF BIRTH:  03/16/1928  DATE OF PROCEDURE:  03/13/2011 DATE OF DISCHARGE:  03/14/2011                           CARDIAC CATHETERIZATION   PRIMARY CARDIOLOGIST:  Gaspar Garbe B. Little, MD  PERFORMING PHYSICIAN:  Carla Corporal, MD  PROCEDURES PERFORMED: 1. Left heart catheterization via 5-French right femoral artery     access. 2. Left ventriculogram in the right anterior oblique projection at 11     mL contrast per second for a total of 33 mL. 3. Left ventricular pullback hemodynamics measurement. 4. Native coronary angiography. 5. Intracoronary nitroglycerin x2. 6. Fractional flow reserve of the left circumflex artery.  INDICATIONS:  Exertional chest discomfort concerning for unstable angina.  BRIEF HISTORY:  Carla Black is a very pleasant 77 year old woman, followed by Dr. Clarene Duke for her coronary artery disease.  She states she has a history of stents placed into her first diagonal branch and the circumflex remotely and then had a nuclear study in March 2009 showing inferolateral ischemia and underwent redo PCI for the portion of the stent distal to the second obtuse marginal branch with an 80% lesion. This was treated with a cutting balloon and then as was the ostium of the first and second obtuse marginals as well as the ostium of the first diagonal branch with a cutting balloon.  This was in 2010 and no stents were placed.  She also has a history of hypertrophic cardiomyopathy and a history of TIA.  She presented to see Dr. Clarene Duke with exertional chest discomfort and shortness of breath and was therefore referred for diagnostic catheterization concerning for possible unstable angina.  The risks, benefits, alternatives, and indications of the procedure were explained to the  patient in detail and informed consent was obtained with signed form placed on the chart.  PROCEDURE:  The patient was brought to the Second Floor [facility] Cardiac Catheterization Lab and prepped and draped in usual sterile fashion.  After time-out period was performed, the patient was sedated with intravenous Versed and fentanyl, a total of 2 mg Versed and 25 mcg of fentanyl.  The right femoral head was localized using tactile and fluoroscopic guidance and the right groin was anesthetized using 1% subcutaneous lidocaine.  The right common femoral artery was then accessed using modified Seldinger technique with placement of 5-French sheath.  After the sheath was aspirated and flushed, first a 5-French JL- 4, followed by 5-French JR-4 catheter were advanced over wire.  Multiple angiographic views of the first left, then right coronary artery system were obtained.  I initially attempted to cross the aortic valve with the JR-4 catheter, however, it was very difficult to get stable hemodynamic measurements with the JR-4; therefore, I decided to cross with a pigtail catheter.  With pigtail catheter in place, I performed a pullback from distal to mid across the aortic valve with measurement of gradient and again noted intracardiac gradient of at least 45-55 mmHg although there was significant ectopy during this pullback.  The highest peak pressures in the mid distal portion were  close to 180 whereas the mid pressure was 126 but the best recorded was 168/12 in the distal, mid, and then at the outflow tract was 126/18 and central aortic pressure of 130/56.  The catheter was then removed completely out of body over wire and decision was made to perform FFR measurement of the several areas of mild in-stent stenosis in the circumflex artery.  There looked to be one slightly more progression of disease in the circumflex artery just before the takeoff of second obtuse marginal.  This had been  a previously balloon angioplastied location.  With the pigtail catheter out, I used a 5-French JL-4 guiding catheter to engage the left coronary artery and with much difficulty, we were able to get the FFR wire functioning and the patient was administered a total of 5000 units of heparin to maintain ACT greater than 200 seconds. The FFR wire was then advanced down into the atrioventricular groove circumflex beyond the stented segment and adenosine was infused at a standard rate based on weight and for total of 2 minutes and the final FFR was 1.  Therefore, the wire was removed completely out of body as was the guide catheter over wire and the sheath was sewn in the place for removal after ACT gets below 170 seconds.  The patient was stable before, during, and after the procedure with no complications. Estimated blood loss less than 10 mL.  CATHETERIZATION STATISTICS: 1. A total of 2 mg of Versed and 25 mcg of fentanyl were used for     sedation. 2. Contrast 220 mL. 3. IV heparin 5000 units. 4. Adenosine infusion for FFR.  HEMODYNAMIC RESULTS: 1. Distal left ventricular pressure as recorded was 168/12; however,     on relook, it was 186/12/18 and then in the mid midportion was     126/18.  Then across the valve 130/56.  EDP in distal was 20 mmHg. 2. Left ventriculography was performed showing an EF of 65% to 70%     with no significant wall motion abnormalities, hyperdynamic.  ANGIOGRAPHIC FINDINGS: 1. The right coronary artery is a known small nondominant vessel with     severe diffuse mid lesions of 75% and being subtotally occluded     more distally at the bifurcation point. 2. The left main is a large-caliber vessel bifurcates normally into a     circumflex and an LAD. 3. Circumflex is a large-caliber vessel, gives rise to two proximal     OMs and then several branches in the atrioventricular groove as     posterolateral branches and then the posterior ascending artery as      well.  There is a long segment of stent dissection right before the     atrioventricular groove which has tandem lesions of 40%, 50%, 60%.     These were measured with FFR and noted to be nonhemodynamically     significant.  There are some stenoses in the ostium of the second     obtuse marginal which is again roughly 50% to 60%.  Remainder of     the circumflex has diffuse luminal irregularities but not     significant.  The circumflex is a dominant vessel. 4. The LAD is a large-caliber vessel and gives rise to two proximal     diagonal branches, second of which has a probably 40 mm at best     stenosis proximally.  The remainder of the LAD has no real     significant coronary disease  with very  tortuous vessels.  IMPRESSION: 1. Nonobstructive coronary artery disease with patent stents in the     diagonal branch and the circumflex.  Fractional flow reserve of     circumflex is nonphysiologically significant. 2. Hyperdynamic left ventricle with ejection fraction of 65% to 70%     and definitely intraventricular gradient as previously recorded was     roughly 56 mmHg, it was roughly somewhere between 50-55 mmHg, best     measurement here. 3. Hypertensive with blood pressures in the 180 systolic.  PLAN:  Standard post catheterization care.  Continue with current management but probably will need additional afterload reduction as I wonder if some of her symptoms could be from demand ischemia due to her hypertrophic cardiomyopathy and hypertension.  The patient will follow back up with Dr. Clarene Duke in 1-2 weeks.          ______________________________ Carla Corporal, MD     DWH/MEDQ  D:  03/15/2011  T:  03/15/2011  Job:  [id number]  cc:   Thereasa Solo. Little, M.D. Second [facility] Cardiac Cath Lab  Electronically Signed by Bryan Lemma MD on 03/16/2011 12:06:35 AM

## 2011-04-03 ENCOUNTER — Other Ambulatory Visit: Payer: Self-pay

## 2011-04-03 ENCOUNTER — Ambulatory Visit: Payer: Self-pay

## 2011-04-27 ENCOUNTER — Emergency Department (HOSPITAL_COMMUNITY): Payer: Medicare Other

## 2011-04-27 ENCOUNTER — Encounter (HOSPITAL_COMMUNITY): Payer: Self-pay

## 2011-04-27 ENCOUNTER — Other Ambulatory Visit: Payer: Self-pay

## 2011-04-27 ENCOUNTER — Inpatient Hospital Stay (HOSPITAL_COMMUNITY)
Admission: EM | Admit: 2011-04-27 | Discharge: 2011-05-10 | DRG: 308 | Disposition: A | Discharge status: Home Health | Payer: Medicare Other | Attending: Cardiology | Admitting: Cardiology

## 2011-04-27 DIAGNOSIS — I059 Rheumatic mitral valve disease, unspecified: Secondary | ICD-10-CM | POA: Diagnosis present

## 2011-04-27 DIAGNOSIS — E0590 Thyrotoxicosis, unspecified without thyrotoxic crisis or storm: Secondary | ICD-10-CM | POA: Diagnosis present

## 2011-04-27 DIAGNOSIS — K219 Gastro-esophageal reflux disease without esophagitis: Secondary | ICD-10-CM | POA: Diagnosis present

## 2011-04-27 DIAGNOSIS — I4891 Unspecified atrial fibrillation: Principal | ICD-10-CM | POA: Diagnosis present

## 2011-04-27 DIAGNOSIS — I5033 Acute on chronic diastolic (congestive) heart failure: Secondary | ICD-10-CM | POA: Diagnosis present

## 2011-04-27 DIAGNOSIS — A498 Other bacterial infections of unspecified site: Secondary | ICD-10-CM

## 2011-04-27 DIAGNOSIS — I110 Hypertensive heart disease with heart failure: Secondary | ICD-10-CM | POA: Diagnosis present

## 2011-04-27 DIAGNOSIS — I421 Obstructive hypertrophic cardiomyopathy: Secondary | ICD-10-CM

## 2011-04-27 DIAGNOSIS — I2510 Atherosclerotic heart disease of native coronary artery without angina pectoris: Secondary | ICD-10-CM | POA: Diagnosis present

## 2011-04-27 DIAGNOSIS — Z7901 Long term (current) use of anticoagulants: Secondary | ICD-10-CM

## 2011-04-27 DIAGNOSIS — Z8673 Personal history of transient ischemic attack (TIA), and cerebral infarction without residual deficits: Secondary | ICD-10-CM

## 2011-04-27 DIAGNOSIS — E785 Hyperlipidemia, unspecified: Secondary | ICD-10-CM | POA: Diagnosis present

## 2011-04-27 DIAGNOSIS — I10 Essential (primary) hypertension: Secondary | ICD-10-CM | POA: Diagnosis present

## 2011-04-27 DIAGNOSIS — A0472 Enterocolitis due to Clostridium difficile, not specified as recurrent: Secondary | ICD-10-CM | POA: Diagnosis present

## 2011-04-27 DIAGNOSIS — I509 Heart failure, unspecified: Secondary | ICD-10-CM | POA: Diagnosis present

## 2011-04-27 DIAGNOSIS — I340 Nonrheumatic mitral (valve) insufficiency: Secondary | ICD-10-CM | POA: Diagnosis present

## 2011-04-27 DIAGNOSIS — I428 Other cardiomyopathies: Secondary | ICD-10-CM | POA: Diagnosis present

## 2011-04-27 DIAGNOSIS — I5030 Unspecified diastolic (congestive) heart failure: Secondary | ICD-10-CM | POA: Diagnosis present

## 2011-04-27 HISTORY — DX: Angina pectoris, unspecified: I20.9

## 2011-04-27 HISTORY — DX: Shortness of breath: R06.02

## 2011-04-27 HISTORY — DX: Atherosclerotic heart disease of native coronary artery without angina pectoris: I25.10

## 2011-04-27 HISTORY — DX: Pneumonia, unspecified organism: J18.9

## 2011-04-27 HISTORY — DX: Other hypertrophic cardiomyopathy: I42.2

## 2011-04-27 LAB — URINALYSIS, ROUTINE W REFLEX MICROSCOPIC
Bilirubin Urine: NEGATIVE
Glucose, UA: NEGATIVE mg/dL
Hgb urine dipstick: NEGATIVE
Protein, ur: NEGATIVE mg/dL
Specific Gravity, Urine: 1.009 (ref 1.005–1.030)
Urobilinogen, UA: 0.2 mg/dL (ref 0.0–1.0)

## 2011-04-27 LAB — COMPREHENSIVE METABOLIC PANEL
ALT: 14 U/L (ref 0–35)
AST: 19 U/L (ref 0–37)
Alkaline Phosphatase: 80 U/L (ref 39–117)
CO2: 28 mEq/L (ref 19–32)
GFR calc Af Amer: 47 mL/min — ABNORMAL LOW (ref >90)
GFR calc non Af Amer: 41 mL/min — ABNORMAL LOW (ref >90)
Glucose, Bld: 116 mg/dL — ABNORMAL HIGH (ref 70–99)
Potassium: 3.6 mEq/L (ref 3.5–5.1)
Sodium: 140 mEq/L (ref 135–145)

## 2011-04-27 LAB — PROTIME-INR: Prothrombin Time: 14.5 seconds (ref 11.6–15.2)

## 2011-04-27 LAB — DIFFERENTIAL
Basophils Absolute: 0.1 10*3/uL (ref 0.0–0.1)
Lymphocytes Relative: 28 % (ref 12–46)
Lymphs Abs: 2.9 10*3/uL (ref 0.7–4.0)
Neutro Abs: 5.8 10*3/uL (ref 1.7–7.7)
Neutrophils Relative %: 56 % (ref 43–77)

## 2011-04-27 LAB — URINE MICROSCOPIC-ADD ON

## 2011-04-27 LAB — APTT: aPTT: 39 seconds — ABNORMAL HIGH (ref 24–37)

## 2011-04-27 LAB — CBC
MCV: 94.7 fL (ref 78.0–100.0)
Platelets: 227 10*3/uL (ref 150–400)
RBC: 4.12 MIL/uL (ref 3.87–5.11)
RDW: 14.9 % (ref 11.5–15.5)
WBC: 10.3 10*3/uL (ref 4.0–10.5)

## 2011-04-27 LAB — TROPONIN I: Troponin I: <0.3 ng/mL (ref <0.30)

## 2011-04-27 MED ORDER — HEPARIN BOLUS VIA INFUSION
3000.0000 [IU] | Freq: Once | INTRAVENOUS | Status: AC
  Administered 2011-04-27: 3000 [IU] via INTRAVENOUS
  Filled 2011-04-27: qty 3000

## 2011-04-27 MED ORDER — DILTIAZEM HCL 25 MG/5ML IV SOLN
10.0000 mg | Freq: Once | INTRAVENOUS | Status: AC
  Administered 2011-04-27: 10 mg via INTRAVENOUS
  Filled 2011-04-27: qty 5

## 2011-04-27 MED ORDER — DILTIAZEM HCL 100 MG IV SOLR
5.0000 mg/h | Freq: Once | INTRAVENOUS | Status: AC
  Administered 2011-04-27: 5 mg/h via INTRAVENOUS
  Filled 2011-04-27: qty 100

## 2011-04-27 MED ORDER — HEPARIN (PORCINE) IN NACL 100-0.45 UNIT/ML-% IJ SOLN
12.0000 [IU]/kg/h | INTRAMUSCULAR | Status: DC
  Administered 2011-04-27: 12 [IU]/kg/h via INTRAVENOUS
  Filled 2011-04-27 (×2): qty 250

## 2011-04-27 NOTE — Progress Notes (Signed)
ANTICOAGULATION CONSULT NOTE - Initial Consult  Pharmacy Consult for UFH Indication: atrial fibrillation  Allergies  Allergen Reactions  . Codeine   . Seldane (Terfenadine)     Patient Measurements: Height: 5' 3.5" (161.3 cm) Weight: 182 lb (82.555 kg) IBW/kg (Calculated) : 53.55  Adjusted Body Weight: 71.7  Vital Signs: Temp: 98.3 F (36.8 C) (11/05 1842) Temp src: Oral (11/05 1842) BP: 145/58 mmHg (11/05 2031) Pulse Rate: 107  (11/05 2031)  Labs:  Basename 04/27/11 1941 04/27/11 1737  HGB -- 12.6  HCT -- 39.0  PLT -- 227  APTT -- 39*  LABPROT -- 14.5  INR -- 1.11  HEPARINUNFRC -- --  CREATININE -- 1.21*  CKTOTAL -- --  CKMB -- --  TROPONINI <0.30 --   Estimated Creatinine Clearance: 37.5 ml/min (by C-G formula based on Cr of 1.21).  Medical History: Past Medical History  Diagnosis Date  . Hypertension   . Hyperlipidemia   . Heart murmur   . GERD (gastroesophageal reflux disease)   . Enlarged heart   . Arthritis   . Joint pain     left knee  . Dizziness   . Chest tightness 02/09/2008  . Hiatal hernia   . CHF (congestive heart failure)   . Carotid artery occlusion     Medications:   (Not in a hospital admission)  Assessment: 77 y/o female patient admitted with shortness of breath, found to be in afib requiring anticoagulation. Goal of Therapy:  Heparin level 0.3-0.7 units/ml   Plan:  Heparin 3000 unit IV bolus followed by infusion at 1000 units/hr. Check 8 hour heparin level. Daily cbc and heparin level.  Leodis Binet, Venora Maples 04/27/2011,9:25 PM

## 2011-04-27 NOTE — ED Notes (Signed)
Pt here for sob, and elevated hr. Dyspnea on exertion, sts seen at [facility] on sat and sun for same started on abx for pna and no improvement, seen today for same and sent here to be evaluated. Pt denies pain. sts progressive cough, sob, doe, and pna symptoms are only complaits.

## 2011-04-27 NOTE — ED Provider Notes (Signed)
History     CSN: [id number] Arrival date & time: 04/27/2011  5:12 PM   First MD Initiated Contact with Patient 04/27/11 1714      Chief Complaint  Patient presents with  . Atrial Fibrillation  . Cough  . Shortness of Breath    (Consider location/radiation/quality/duration/timing/severity/associated sxs/prior Treatment)  HPI  Patient relates she started having a hacking cough about a week ago and would only have coughing spells once or twice a day. She relates that got worse and two days ago she went to urgent care on [address] and was diagnosed with pneumonia on her chest x-ray. She received a Rocephin injection and was started on a Z-Pak. She relates she still coughing she sometimes has light tan sputum she denies fever or chills other this morning when she woke up her night gown was wet. She denies nausea or vomiting but she has had some diarrhea since starting on antibiotics with 3-4 episodes a day. She denies chest pain or abdominal pain but states she is short of breath. She states sometimes she has wheezing. She has had used an inhaler in the past and states that was in April. She states she has had swelling in her legs which she's on Lasix and the swelling is improved. She relates since September she's had an awareness that sometimes her heart beats fast but she's never discussed it with her doctor. She states she seen Dr. Caprice Kluver, cardiology and states she saw him when her heart rate was too slow and he put her on the Tekturna, and discussed at that time she might need a pacemaker, then when she saw him in September her heart rate was too fast and they stopped her tekturna and  her quinapril. She states she's never heard that she has atrial fibrillation or an irregular heartbeat. She went back to the urgent care on [address] today and they sent her to the ER for evaluation. Patient states nothing makes her feel worse nothing makes her feel better. She does state she feels very fatigued  with lack of energy.   Primary Care Dr Merla Riches, Urgent [facility] Cardiologist, Dr Caprice Kluver   Past Medical History  Diagnosis Date  . Hypertension   . Hyperlipidemia   . Heart murmur   . GERD (gastroesophageal reflux disease)   . Enlarged heart   . Arthritis   . Joint pain     left knee  . Dizziness   . Chest tightness 02/09/2008  . Hiatal hernia   . CHF (congestive heart failure)   . Carotid artery occlusion   Heart murmur  Past Surgical History  Procedure Date  . Appendectomy     and tonsillectomy  . Abdominal hysterectomy   . Malignant melanoma removal 1961  . Abdominal surgery     benign tumor removed  . Vocal cord surgery     vocal cord node removed  . Thymus gland tumor resection 1991  . Cardiac catheterization     Family History  Problem Relation Age of Onset  . Heart disease Mother   . Other Father     aneurysm  . Coronary artery disease Other   . Stroke Other     History  Substance Use Topics  . Smoking status: Former Smoker    Types: Cigarettes    Quit date: 06/22/1989  . Smokeless tobacco: Not on file  . Alcohol Use: No  Lives with spouse  OB History    Grav Para Term  Preterm Abortions TAB SAB Ect Mult Living                  Review of Systems  All other systems reviewed and are negative.    Allergies  Codeine and Seldane  Home Medications   Current Outpatient Rx  Name Route Sig Dispense Refill  . ALISKIREN FUMARATE 300 MG PO TABS Oral Take 300 mg by mouth daily.      . AMOXICILLIN 500 MG PO CAPS Oral Take 500 mg by mouth as needed. 4 capsules 1 hour prior to dental procedures     . ASPIRIN 81 MG PO TABS Oral Take 81 mg by mouth daily.      Marland Kitchen CALCIUM CITRATE + PO Oral Take by mouth daily.      Marland Kitchen VITAMIN D PO Oral Take 2,000 mg by mouth daily.      Marland Kitchen CLOPIDOGREL BISULFATE 75 MG PO TABS Oral Take 75 mg by mouth daily.      Marland Kitchen DILTIAZEM HCL ER BEADS 360 MG PO CP24 Oral Take 360 mg by mouth daily.      . FUROSEMIDE 40 MG  PO TABS Oral Take 40 mg by mouth as needed.      Marland Kitchen LEVOTHYROXINE SODIUM 100 MCG PO TABS Oral Take 100 mcg by mouth daily.      Marland Kitchen CLARITIN PO Oral Take by mouth daily.      Marland Kitchen MECLIZINE HCL 25 MG PO TABS Oral Take 25 mg by mouth 3 (three) times daily as needed.      . ICAPS AREDS FORMULA PO Oral Take by mouth 2 (two) times daily.      Marland Kitchen NITROGLYCERIN 0.4 MG SL SUBL Sublingual Place 0.4 mg under the tongue as needed.      . OMEGA 3-6-9 COMPLEX PO Oral Take 1 tablet by mouth 2 (two) times daily.      Marland Kitchen OMEPRAZOLE 20 MG PO CPDR Oral Take 20 mg by mouth daily.      Marland Kitchen POTASSIUM CHLORIDE 20 MEQ PO PACK Oral Take 20 mEq by mouth as needed.      Marland Kitchen ALIGN PO Oral Take by mouth daily.      . QUINAPRIL HCL 20 MG PO TABS Oral Take 20 mg by mouth 2 (two) times daily.        BP 106/84  Pulse 119  Temp(Src) 98.3 F (36.8 C) (Oral)  Resp 20  Ht 5\' 3"  (1.6 m)  Wt 182 lb (82.555 kg)  BMI 32.24 kg/m2  SpO2 99%  Vital signs normal except for tachycardia  Physical Exam  Vitals reviewed. Constitutional: She is oriented to person, place, and time. She appears well-developed and well-nourished.  HENT:  Head: Normocephalic and atraumatic.  Mouth/Throat: Uvula is midline, oropharynx is clear and moist and mucous membranes are normal.  Eyes: Conjunctivae and EOM are normal. Pupils are equal, round, and reactive to light.  Neck: Normal range of motion. Neck supple.  Cardiovascular: S1 normal and S2 normal.  An irregularly irregular rhythm present. Tachycardia present.  PMI is displaced.   Murmur heard.  Crescendo systolic murmur is present with a grade of 3/6       Patient has a harsh high-pitched  murmur heard best in the left upper sternal border that appears to be systolic  Abdominal: Soft. Bowel sounds are normal. She exhibits no distension. There is no tenderness. There is no rebound and no guarding.  Musculoskeletal: Normal range of motion.  No edema noted  Neurological: She is alert and oriented  to person, place, and time. No cranial nerve deficit.  Skin: Skin is warm and dry. No rash noted. No erythema.  Psychiatric: She has a normal mood and affect. Her behavior is normal. Thought content normal.    ED Course  Procedures (including critical care time)  Results for orders placed during the hospital encounter of 04/27/11  CBC      Component Value Range   WBC 10.3  4.0 - 10.5 (K/uL)   RBC 4.12  3.87 - 5.11 (MIL/uL)   Hemoglobin 12.6  12.0 - 15.0 (g/dL)   HCT 57.8  46.9 - 62.9 (%)   MCV 94.7  78.0 - 100.0 (fL)   MCH 30.6  26.0 - 34.0 (pg)   MCHC 32.3  30.0 - 36.0 (g/dL)   RDW 52.8  41.3 - 24.4 (%)   Platelets 227  150 - 400 (K/uL)  DIFFERENTIAL      Component Value Range   Neutrophils Relative 56  43 - 77 (%)   Neutro Abs 5.8  1.7 - 7.7 (K/uL)   Lymphocytes Relative 28  12 - 46 (%)   Lymphs Abs 2.9  0.7 - 4.0 (K/uL)   Monocytes Relative 13 (*) 3 - 12 (%)   Monocytes Absolute 1.4 (*) 0.1 - 1.0 (K/uL)   Eosinophils Relative 3  0 - 5 (%)   Eosinophils Absolute 0.3  0.0 - 0.7 (K/uL)   Basophils Relative 1  0 - 1 (%)   Basophils Absolute 0.1  0.0 - 0.1 (K/uL)  COMPREHENSIVE METABOLIC PANEL      Component Value Range   Sodium 140  135 - 145 (mEq/L)   Potassium 3.6  3.5 - 5.1 (mEq/L)   Chloride 100  96 - 112 (mEq/L)   CO2 28  19 - 32 (mEq/L)   Glucose, Bld 116 (*) 70 - 99 (mg/dL)   BUN 21  6 - 23 (mg/dL)   Creatinine, Ser 0.10 (*) 0.50 - 1.10 (mg/dL)   Calcium 9.1  8.4 - 27.2 (mg/dL)   Total Protein 7.4  6.0 - 8.3 (g/dL)   Albumin 3.4 (*) 3.5 - 5.2 (g/dL)   AST 19  0 - 37 (U/L)   ALT 14  0 - 35 (U/L)   Alkaline Phosphatase 80  39 - 117 (U/L)   Total Bilirubin 0.3  0.3 - 1.2 (mg/dL)   GFR calc non Af Amer 41 (*) >90 (mL/min)   GFR calc Af Amer 47 (*) >90 (mL/min)  MAGNESIUM      Component Value Range   Magnesium 1.8  1.5 - 2.5 (mg/dL)  URINALYSIS, ROUTINE W REFLEX MICROSCOPIC      Component Value Range   Color, Urine YELLOW  YELLOW    Appearance CLEAR  CLEAR     Specific Gravity, Urine 1.009  1.005 - 1.030    pH 5.0  5.0 - 8.0    Glucose, UA NEGATIVE  NEGATIVE (mg/dL)   Hgb urine dipstick NEGATIVE  NEGATIVE    Bilirubin Urine NEGATIVE  NEGATIVE    Ketones, ur NEGATIVE  NEGATIVE (mg/dL)   Protein, ur NEGATIVE  NEGATIVE (mg/dL)   Urobilinogen, UA 0.2  0.0 - 1.0 (mg/dL)   Nitrite NEGATIVE  NEGATIVE    Leukocytes, UA TRACE (*) NEGATIVE   APTT      Component Value Range   aPTT 39 (*) 24 - 37 (seconds)  PROTIME-INR      Component Value Range  Prothrombin Time 14.5  11.6 - 15.2 (seconds)   INR 1.11  0.00 - 1.49   TROPONIN I      Component Value Range   Troponin I <0.30  <0.30 (ng/mL)  URINE MICROSCOPIC-ADD ON      Component Value Range   Squamous Epithelial / LPF RARE  RARE    WBC, UA 3-6  <3 (WBC/hpf)   RBC / HPF 0-2  <3 (RBC/hpf)   Bacteria, UA RARE  RARE    Casts HYALINE CASTS (*) NEGATIVE   POCT I-STAT TROPONIN I      Component Value Range   Troponin i, poc 0.06  0.00 - 0.08 (ng/mL)   Comment 3            Laboratory interpretation possible UTI otherwise normal  Dg Chest Portable 1 View  04/27/2011  *RADIOLOGY REPORT*  Clinical Data: Shortness of breath, heart murmur, chest tightness, dizziness, history of enlarged heart and stenting  PORTABLE CHEST - 1 VIEW  Comparison: Portable exam 1800 hours compared to 03/11/2011  Findings: Enlargement of cardiac silhouette post CABG. Atherosclerotic calcification aortic arch. Pulmonary vascular congestion. Right basilar atelectasis versus infiltrate. Remaining lungs clear. No gross pleural effusion or pneumothorax.  IMPRESSION: Enlargement of cardiac silhouette with pulmonary vascular congestion post CABG. Right basilar atelectasis versus infiltrate.  Original Report Authenticated By: Lollie Marrow, M.D.   PT started treatment for pneumonia two days ago with rocephin IM and oral zithromax x 3 doses    Date: 04/27/2011  Rate: 121  Rhythm: atrial fibrillation  QRS Axis: normal  Intervals:  normal  ST/T Wave abnormalities: nonspecific ST/T changes  Conduction Disutrbances:none  Narrative Interpretation: LVH with strain pattern  Old EKG Reviewed: changes noted from Sept 21, 2012 (was in [place])   Course  Patient started on Cardizem drip. She remains in atrial fibrillation with a rate-controlled of about 105  20:18 Dr Demaris Callander, PA will admit to Tele   Diagnoses that have been ruled out:  Diagnoses that are still under consideration:  Final diagnoses:  Atrial fibrillation  New onset atrial fibrillation RLL pneumonia, community acquired, already started on antibiotics 2 days ago.    Devoria Albe, MD, FACEP   MDM          Ward Givens, MD 04/27/11 2229

## 2011-04-27 NOTE — ED Notes (Signed)
Received patient and assumed care, pt presents from Urgent medical in AF HR in 130s, pt denies CP, SOB, numbness or tingling sensation, husband at bedside, pending re evaluation

## 2011-04-27 NOTE — H&P (Signed)
Carla Black is an 77 y.o. female.   Chief Complaint: Shortness of Breath, Fatigue HPI:  77 year old white female presenting to [facility] ED from Dr Doolittle's office with complaints of weakness, SOB and cough. Sx began last Wednesday with a  Cough and feeling poorly. She saw her PCP on Saturday and was given a dose of Rocephin IM and started on Amoxicillin po. Initially she though she may be improving but today felt worse with weakness, SOB and continued cough which is productive with yellow sputum. On arrival to the ED, EKG reveals Afib with RVR at 121 bpm with evidence of LVH.She has some chest tightness without radiation no nausea or vomiting. She reports occasional palpitations but has no recollection as to when they may have started.She has been started on heparin and IV Cardizem here in the ED and her rate is now 100.  Past Medical History  Diagnosis Date  . Hypertension   . Hyperlipidemia   . Heart murmur   . GERD (gastroesophageal reflux disease)   . Enlarged heart   . Arthritis   . Joint pain     left knee  . Dizziness   . Chest tightness 02/09/2008  . Hiatal hernia   . CHF (congestive heart failure)   . Carotid artery occlusion     Past Surgical History  Procedure Date  . Appendectomy     and tonsillectomy  . Abdominal hysterectomy   . Malignant melanoma removal 1961  . Abdominal surgery     benign tumor removed  . Vocal cord surgery     vocal cord node removed  . Thymus gland tumor resection 1991  . Cardiac catheterization     Family History  Problem Relation Age of Onset  . Heart disease Mother   . Other Father     aneurysm  . Coronary artery disease Other   . Stroke Other    Social History:  reports that she quit smoking about 21 years ago. Her smoking use included Cigarettes. She does not have any smokeless tobacco history on file. She reports that she does not drink alcohol. Her drug history not on file.  Allergies:  Allergies  Allergen Reactions   . Codeine   . Seldane (Terfenadine)     Medications Prior to Admission  Medication Dose Route Frequency Provider Last Rate Last Dose  . diltiazem (CARDIZEM) 100 mg in dextrose 5 % 100 mL infusion  5-15 mg/hr Intravenous Once Ward Givens, MD 10 mL/hr at 04/27/11 2030 10 mg/hr at 04/27/11 2030  . diltiazem (CARDIZEM) injection 10 mg  10 mg Intravenous Once Ward Givens, MD   10 mg at 04/27/11 1824  . heparin 100 units/mL bolus via infusion 3,000 Units  3,000 Units Intravenous Once [facility], [place]   3,000 Units at 04/27/11 2130  . heparin ADULT infusion 100 units/mL (25000 units/250 mL)  12 Units/kg/hr Intravenous Continuous [address] [place], [place] 10 mL/hr at 04/27/11 2218 12 Units/kg/hr at 04/27/11 2218   Medications Prior to Admission  Medication Sig Dispense Refill  . aspirin 81 MG tablet Take 81 mg by mouth daily.        . Cholecalciferol (VITAMIN D PO) Take 2,000 mg by mouth daily.        . clopidogrel (PLAVIX) 75 MG tablet Take 75 mg by mouth daily.        Marland Kitchen diltiazem (TIAZAC) 360 MG 24 hr capsule Take 360 mg by mouth daily.        Marland Kitchen  furosemide (LASIX) 40 MG tablet Take 40 mg by mouth as needed.        Marland Kitchen levothyroxine (SYNTHROID, LEVOTHROID) 100 MCG tablet Take 100 mcg by mouth daily.        . Multiple Vitamins-Minerals (ICAPS AREDS FORMULA PO) Take by mouth 2 (two) times daily.        . Omega 3-6-9 Fatty Acids (OMEGA 3-6-9 COMPLEX PO) Take 1 tablet by mouth 2 (two) times daily.        . potassium chloride (KLOR-CON) 20 MEQ packet Take 20 mEq by mouth as needed.        . Probiotic Product (ALIGN PO) Take by mouth daily.        Marland Kitchen amoxicillin (AMOXIL) 500 MG capsule Take 500 mg by mouth as needed. 4 capsules 1 hour prior to dental procedures       . nitroGLYCERIN (NITROSTAT) 0.4 MG SL tablet Place 0.4 mg under the tongue as needed.          Results for orders placed during the hospital encounter of 04/27/11 (from the past 48 hour(s))  CBC     Status: Normal     Collection Time   04/27/11  5:37 PM      Component Value Range Comment   WBC 10.3  4.0 - 10.5 (K/uL)    RBC 4.12  3.87 - 5.11 (MIL/uL)    Hemoglobin 12.6  12.0 - 15.0 (g/dL)    HCT 11.9  14.7 - 82.9 (%)    MCV 94.7  78.0 - 100.0 (fL)    MCH 30.6  26.0 - 34.0 (pg)    MCHC 32.3  30.0 - 36.0 (g/dL)    RDW 56.2  13.0 - 86.5 (%)    Platelets 227  150 - 400 (K/uL)   DIFFERENTIAL     Status: Abnormal   Collection Time   04/27/11  5:37 PM      Component Value Range Comment   Neutrophils Relative 56  43 - 77 (%)    Neutro Abs 5.8  1.7 - 7.7 (K/uL)    Lymphocytes Relative 28  12 - 46 (%)    Lymphs Abs 2.9  0.7 - 4.0 (K/uL)    Monocytes Relative 13 (*) 3 - 12 (%)    Monocytes Absolute 1.4 (*) 0.1 - 1.0 (K/uL)    Eosinophils Relative 3  0 - 5 (%)    Eosinophils Absolute 0.3  0.0 - 0.7 (K/uL)    Basophils Relative 1  0 - 1 (%)    Basophils Absolute 0.1  0.0 - 0.1 (K/uL)   COMPREHENSIVE METABOLIC PANEL     Status: Abnormal   Collection Time   04/27/11  5:37 PM      Component Value Range Comment   Sodium 140  135 - 145 (mEq/L)    Potassium 3.6  3.5 - 5.1 (mEq/L)    Chloride 100  96 - 112 (mEq/L)    CO2 28  19 - 32 (mEq/L)    Glucose, Bld 116 (*) 70 - 99 (mg/dL)    BUN 21  6 - 23 (mg/dL)    Creatinine, Ser 7.84 (*) 0.50 - 1.10 (mg/dL)    Calcium 9.1  8.4 - 10.5 (mg/dL)    Total Protein 7.4  6.0 - 8.3 (g/dL)    Albumin 3.4 (*) 3.5 - 5.2 (g/dL)    AST 19  0 - 37 (U/L)    ALT 14  0 - 35 (U/L)    Alkaline Phosphatase  80  39 - 117 (U/L)    Total Bilirubin 0.3  0.3 - 1.2 (mg/dL)    GFR calc non Af Amer 41 (*) >90 (mL/min)    GFR calc Af Amer 47 (*) >90 (mL/min)   MAGNESIUM     Status: Normal   Collection Time   04/27/11  5:37 PM      Component Value Range Comment   Magnesium 1.8  1.5 - 2.5 (mg/dL)   APTT     Status: Abnormal   Collection Time   04/27/11  5:37 PM      Component Value Range Comment   aPTT 39 (*) 24 - 37 (seconds)   PROTIME-INR     Status: Normal   Collection Time    04/27/11  5:37 PM      Component Value Range Comment   Prothrombin Time 14.5  11.6 - 15.2 (seconds)    INR 1.11  0.00 - 1.49    URINALYSIS, ROUTINE W REFLEX MICROSCOPIC     Status: Abnormal   Collection Time   04/27/11  6:42 PM      Component Value Range Comment   Color, Urine YELLOW  YELLOW     Appearance CLEAR  CLEAR     Specific Gravity, Urine 1.009  1.005 - 1.030     pH 5.0  5.0 - 8.0     Glucose, UA NEGATIVE  NEGATIVE (mg/dL)    Hgb urine dipstick NEGATIVE  NEGATIVE     Bilirubin Urine NEGATIVE  NEGATIVE     Ketones, ur NEGATIVE  NEGATIVE (mg/dL)    Protein, ur NEGATIVE  NEGATIVE (mg/dL)    Urobilinogen, UA 0.2  0.0 - 1.0 (mg/dL)    Nitrite NEGATIVE  NEGATIVE     Leukocytes, UA TRACE (*) NEGATIVE    URINE MICROSCOPIC-ADD ON     Status: Abnormal   Collection Time   04/27/11  6:42 PM      Component Value Range Comment   Squamous Epithelial / LPF RARE  RARE     WBC, UA 3-6  <3 (WBC/hpf)    RBC / HPF 0-2  <3 (RBC/hpf)    Bacteria, UA RARE  RARE     Casts HYALINE CASTS (*) NEGATIVE    TROPONIN I     Status: Normal   Collection Time   04/27/11  7:41 PM      Component Value Range Comment   Troponin I <0.30  <0.30 (ng/mL)   POCT I-STAT TROPONIN I     Status: Normal   Collection Time   04/27/11  7:59 PM      Component Value Range Comment   Troponin i, poc 0.06  0.00 - 0.08 (ng/mL)    Comment 3             Dg Chest Portable 1 View  04/27/2011  *RADIOLOGY REPORT*  Clinical Data: Shortness of breath, heart murmur, chest tightness, dizziness, history of enlarged heart and stenting  PORTABLE CHEST - 1 VIEW  Comparison: Portable exam 1800 hours compared to 03/11/2011  Findings: Enlargement of cardiac silhouette post CABG. Atherosclerotic calcification aortic arch. Pulmonary vascular congestion. Right basilar atelectasis versus infiltrate. Remaining lungs clear. No gross pleural effusion or pneumothorax.  IMPRESSION: Enlargement of cardiac silhouette with pulmonary vascular congestion post  CABG. Right basilar atelectasis versus infiltrate.  Original Report Authenticated By: Lollie Marrow, M.D.    Review of Systems  Constitutional: Positive for fever, chills, malaise/fatigue and diaphoresis. Negative for weight loss.  HENT: Positive  for congestion. Negative for hearing loss, ear pain, nosebleeds, sore throat, tinnitus and ear discharge.   Eyes: Negative.   Respiratory: Positive for cough, sputum production, shortness of breath and wheezing. Negative for stridor.   Cardiovascular: Positive for chest pain, palpitations and leg swelling.  Gastrointestinal: Positive for diarrhea.  Genitourinary: Negative.   Musculoskeletal: Positive for myalgias.  Neurological: Positive for weakness. Negative for dizziness, tingling, tremors, sensory change, speech change, focal weakness, seizures, loss of consciousness and headaches.  Endo/Heme/Allergies: Negative.   Psychiatric/Behavioral: Negative.     Blood pressure 122/55, pulse 98, temperature 98.3 F (36.8 C), temperature source Oral, resp. rate 20, height 5' 3.5" (1.613 m), weight 82.555 kg (182 lb), SpO2 96.00%. Physical Exam  Constitutional: She is oriented to person, place, and time. She appears well-developed and well-nourished. No distress.  HENT:  Head: Normocephalic and atraumatic.  Nose: Nose normal.  Mouth/Throat: Oropharynx is clear and moist.  Eyes: Conjunctivae and EOM are normal. Pupils are equal, round, and reactive to light.  Neck: Normal range of motion. Neck supple. No JVD present. No tracheal deviation present. No thyromegaly present.  Cardiovascular: S1 normal, S2 normal and intact distal pulses.  An irregularly irregular rhythm present. Tachycardia present.   Murmur heard.  Systolic murmur is present with a grade of 2/6  Pulses:      Carotid pulses are 2+ on the right side, and 2+ on the left side.      Radial pulses are 3+ on the right side, and 3+ on the left side.       Femoral pulses are 3+ on the right side,  and 3+ on the left side.      Popliteal pulses are 2+ on the right side, and 2+ on the left side.       Dorsalis pedis pulses are 3+ on the right side, and 3+ on the left side.       Posterior tibial pulses are 3+ on the right side, and 3+ on the left side.  Respiratory: Effort normal. She has decreased breath sounds in the right lower field and the left lower field. She has wheezes in the right upper field, the right middle field, the left upper field and the left middle field.    GI: Soft. Normal appearance, normal aorta and bowel sounds are normal. There is no hepatosplenomegaly. There is no tenderness. There is no guarding and no CVA tenderness.  Musculoskeletal: Normal range of motion.  Lymphadenopathy:    She has no cervical adenopathy.  Neurological: She is alert and oriented to person, place, and time. She has normal strength. No cranial nerve deficit or sensory deficit. She displays a negative Romberg sign.  Skin: Skin is warm, dry and intact. She is not diaphoretic. No cyanosis. There is pallor. Nails show no clubbing.  Psychiatric: She has a normal mood and affect. Her speech is normal and behavior is normal. Judgment and thought content normal. Cognition and memory are normal.     Assessment/Plan 1. Pneumonia 2. New A-fib with RVR 3. CHF 4. Hypertrophic Cardiomyopathy 5. HTN 6. Dyslipidemia - Intolerant to statins 7. Known CAD Plan Admit to telemetry.  Continue IV heparin and Diltiazem. Cycle enzymes. BNP,TSH,MG. PCXR in am. Echo in am. Begin IV Rocephin and Azithromycin. Lasix 40 mg IV daily.   STONE,KATHERINE L 04/27/2011, 11:11 PM   Agree with note written by Charmian Muff PAC  Pt seen and examined. H/O CAD S/P recent cath with non critical narrowing by FFR Herbie [place]). Nl  LV fxn with 50 mm Hg subvalvular gradient. Pt being Rx with Atbx as OP for ? PNA. Other probs as outlined. Now admitted with increasing SOB, AFIB with RVR. proBNP 8000. CXR c/w mild CHF. Exam benign  except for soft out flow murmur and irregular rhythm. No on IV dilt (was on PO as OP), IV hep and rocephin. Pt is diuresing. Plan TEE DCCV then prob will require coumadin A/C.   Runell Gess 04/28/2011 3:26 PM

## 2011-04-28 ENCOUNTER — Encounter (HOSPITAL_COMMUNITY): Payer: Self-pay | Admitting: *Deleted

## 2011-04-28 ENCOUNTER — Inpatient Hospital Stay (HOSPITAL_COMMUNITY): Payer: Medicare Other

## 2011-04-28 ENCOUNTER — Other Ambulatory Visit: Payer: Self-pay

## 2011-04-28 DIAGNOSIS — E785 Hyperlipidemia, unspecified: Secondary | ICD-10-CM | POA: Insufficient documentation

## 2011-04-28 DIAGNOSIS — J189 Pneumonia, unspecified organism: Secondary | ICD-10-CM | POA: Insufficient documentation

## 2011-04-28 DIAGNOSIS — I10 Essential (primary) hypertension: Secondary | ICD-10-CM | POA: Diagnosis present

## 2011-04-28 DIAGNOSIS — K219 Gastro-esophageal reflux disease without esophagitis: Secondary | ICD-10-CM | POA: Insufficient documentation

## 2011-04-28 DIAGNOSIS — I2510 Atherosclerotic heart disease of native coronary artery without angina pectoris: Secondary | ICD-10-CM | POA: Insufficient documentation

## 2011-04-28 DIAGNOSIS — A498 Other bacterial infections of unspecified site: Secondary | ICD-10-CM | POA: Diagnosis present

## 2011-04-28 LAB — BASIC METABOLIC PANEL
GFR calc Af Amer: 49 mL/min — ABNORMAL LOW (ref >90)
GFR calc non Af Amer: 42 mL/min — ABNORMAL LOW (ref >90)
Glucose, Bld: 119 mg/dL — ABNORMAL HIGH (ref 70–99)
Potassium: 3.6 mEq/L (ref 3.5–5.1)
Sodium: 138 mEq/L (ref 135–145)

## 2011-04-28 LAB — CARDIAC PANEL(CRET KIN+CKTOT+MB+TROPI)
CK, MB: 3.1 ng/mL (ref 0.3–4.0)
CK, MB: 2.7 ng/mL (ref 0.3–4.0)
CK, MB: 2.8 ng/mL (ref 0.3–4.0)
Troponin I: <0.3 ng/mL (ref <0.30)
Troponin I: <0.3 ng/mL (ref <0.30)

## 2011-04-28 LAB — EXPECTORATED SPUTUM ASSESSMENT W GRAM STAIN, RFLX TO RESP C

## 2011-04-28 LAB — CBC
Hemoglobin: 11.5 g/dL — ABNORMAL LOW (ref 12.0–15.0)
MCHC: 31.7 g/dL (ref 30.0–36.0)
RBC: 3.82 MIL/uL — ABNORMAL LOW (ref 3.87–5.11)

## 2011-04-28 LAB — HEPARIN LEVEL (UNFRACTIONATED): Heparin Unfractionated: 0.28 IU/mL — ABNORMAL LOW (ref 0.30–0.70)

## 2011-04-28 MED ORDER — CLOPIDOGREL BISULFATE 75 MG PO TABS
75.0000 mg | ORAL_TABLET | Freq: Every day | ORAL | Status: DC
  Administered 2011-04-28 – 2011-04-29 (×2): 75 mg via ORAL
  Filled 2011-04-28 (×4): qty 1

## 2011-04-28 MED ORDER — DILTIAZEM HCL 100 MG IV SOLR
10.0000 mg/h | INTRAVENOUS | Status: DC
  Administered 2011-04-28 (×2): 5 mg/h via INTRAVENOUS
  Administered 2011-04-29: 10 mg/h via INTRAVENOUS
  Filled 2011-04-28 (×2): qty 100

## 2011-04-28 MED ORDER — ASPIRIN 81 MG PO CHEW
81.0000 mg | CHEWABLE_TABLET | Freq: Every day | ORAL | Status: DC
  Administered 2011-04-28 – 2011-05-10 (×12): 81 mg via ORAL
  Filled 2011-04-28 (×13): qty 1

## 2011-04-28 MED ORDER — FUROSEMIDE 10 MG/ML IJ SOLN
40.0000 mg | Freq: Every day | INTRAMUSCULAR | Status: DC
  Administered 2011-04-28: 40 mg via INTRAVENOUS
  Filled 2011-04-28 (×2): qty 4

## 2011-04-28 MED ORDER — AZITHROMYCIN 250 MG PO TABS
250.0000 mg | ORAL_TABLET | Freq: Every day | ORAL | Status: DC
Start: 2011-04-29 — End: 2011-04-30
  Administered 2011-04-29: 250 mg via ORAL
  Filled 2011-04-28 (×2): qty 1

## 2011-04-28 MED ORDER — METRONIDAZOLE 500 MG PO TABS
500.0000 mg | ORAL_TABLET | Freq: Three times a day (TID) | ORAL | Status: DC
  Filled 2011-04-28 (×2): qty 1

## 2011-04-28 MED ORDER — POTASSIUM CHLORIDE CRYS ER 20 MEQ PO TBCR
20.0000 meq | EXTENDED_RELEASE_TABLET | Freq: Every day | ORAL | Status: DC
  Administered 2011-04-28 – 2011-05-07 (×10): 20 meq via ORAL
  Filled 2011-04-28 (×14): qty 1

## 2011-04-28 MED ORDER — ASPIRIN 81 MG PO TABS: 81.0000 mg | ORAL_TABLET | Freq: Every day | ORAL | Status: DC

## 2011-04-28 MED ORDER — ALIGN PO CAPS: 1.0000 | ORAL_CAPSULE | Freq: Every day | ORAL | Status: DC

## 2011-04-28 MED ORDER — AZITHROMYCIN 500 MG PO TABS
500.0000 mg | ORAL_TABLET | Freq: Every day | ORAL | Status: AC
  Administered 2011-04-28: 500 mg via ORAL
  Filled 2011-04-28: qty 1

## 2011-04-28 MED ORDER — QUINAPRIL HCL 10 MG PO TABS
20.0000 mg | ORAL_TABLET | Freq: Two times a day (BID) | ORAL | Status: DC
  Filled 2011-04-28 (×3): qty 2

## 2011-04-28 MED ORDER — NITROGLYCERIN 0.4 MG SL SUBL: 0.4000 mg | SUBLINGUAL_TABLET | SUBLINGUAL | Status: DC | PRN

## 2011-04-28 MED ORDER — CHOLECALCIFEROL 10 MCG (400 UNIT) PO TABS
400.0000 [IU] | ORAL_TABLET | Freq: Every day | ORAL | Status: DC
  Administered 2011-04-28: 400 [IU] via ORAL
  Filled 2011-04-28 (×12): qty 1

## 2011-04-28 MED ORDER — CALCIUM CARBONATE-VITAMIN D 500-200 MG-UNIT PO TABS
1.0000 | ORAL_TABLET | Freq: Every day | ORAL | Status: DC
  Administered 2011-05-05: 1 via ORAL
  Filled 2011-04-28 (×13): qty 1

## 2011-04-28 MED ORDER — CALCIUM CITRATE + PO TABS: ORAL_TABLET | ORAL | Status: DC

## 2011-04-28 MED ORDER — HEPARIN (PORCINE) IN NACL 100-0.45 UNIT/ML-% IJ SOLN
1200.0000 [IU]/h | INTRAMUSCULAR | Status: DC
  Administered 2011-04-28 – 2011-04-29 (×3): 1200 [IU]/h via INTRAVENOUS
  Filled 2011-04-28 (×3): qty 250

## 2011-04-28 MED ORDER — FUROSEMIDE 10 MG/ML IJ SOLN
40.0000 mg | Freq: Two times a day (BID) | INTRAMUSCULAR | Status: DC
  Administered 2011-04-28 – 2011-05-06 (×14): 40 mg via INTRAVENOUS
  Filled 2011-04-28 (×20): qty 4

## 2011-04-28 MED ORDER — SACCHAROMYCES BOULARDII 250 MG PO CAPS
250.0000 mg | ORAL_CAPSULE | Freq: Two times a day (BID) | ORAL | Status: DC
  Administered 2011-04-29: 250 mg via ORAL
  Filled 2011-04-28 (×2): qty 1

## 2011-04-28 MED ORDER — OMEGA 3-6-9 COMPLEX PO CAPS: ORAL_CAPSULE | Freq: Two times a day (BID) | ORAL | Status: DC

## 2011-04-28 MED ORDER — LISINOPRIL 20 MG PO TABS
20.0000 mg | ORAL_TABLET | Freq: Two times a day (BID) | ORAL | Status: DC
  Administered 2011-04-28: 20 mg via ORAL
  Filled 2011-04-28 (×3): qty 1

## 2011-04-28 MED ORDER — ENOXAPARIN SODIUM 40 MG/0.4ML ~~LOC~~ SOLN
40.0000 mg | SUBCUTANEOUS | Status: DC
  Filled 2011-04-28: qty 0.4

## 2011-04-28 MED ORDER — ALISKIREN FUMARATE 150 MG PO TABS
300.0000 mg | ORAL_TABLET | Freq: Every day | ORAL | Status: DC
  Filled 2011-04-28: qty 2

## 2011-04-28 MED ORDER — DEXTROSE 5 % IV SOLN
1.0000 g | INTRAVENOUS | Status: DC
  Administered 2011-04-28: 1 g via INTRAVENOUS
  Filled 2011-04-28: qty 10

## 2011-04-28 MED ORDER — VANCOMYCIN 50 MG/ML ORAL SOLUTION
125.0000 mg | Freq: Four times a day (QID) | ORAL | Status: DC
  Administered 2011-04-28 – 2011-04-29 (×4): 125 mg via ORAL
  Filled 2011-04-28 (×8): qty 2.5

## 2011-04-28 MED ORDER — PANTOPRAZOLE SODIUM 40 MG PO TBEC
40.0000 mg | DELAYED_RELEASE_TABLET | Freq: Every day | ORAL | Status: DC
  Administered 2011-04-28 – 2011-04-29 (×2): 40 mg via ORAL
  Filled 2011-04-28: qty 1

## 2011-04-28 MED ORDER — LEVOTHYROXINE SODIUM 100 MCG PO TABS
100.0000 ug | ORAL_TABLET | Freq: Every day | ORAL | Status: DC
  Administered 2011-04-28 – 2011-05-10 (×12): 100 ug via ORAL
  Filled 2011-04-28 (×13): qty 1

## 2011-04-28 MED ORDER — POTASSIUM CHLORIDE 20 MEQ PO PACK: 20.0000 meq | PACK | Freq: Every day | ORAL | Status: DC

## 2011-04-28 MED ORDER — METOPROLOL TARTRATE 25 MG/10 ML ORAL SUSPENSION
6.2500 mg | Freq: Two times a day (BID) | ORAL | Status: DC
  Administered 2011-04-28 – 2011-04-30 (×5): 6.25 mg via ORAL
  Filled 2011-04-28 (×9): qty 2.5

## 2011-04-28 MED ORDER — METRONIDAZOLE 500 MG PO TABS
500.0000 mg | ORAL_TABLET | Freq: Four times a day (QID) | ORAL | Status: DC
  Administered 2011-04-28 – 2011-05-09 (×39): 500 mg via ORAL
  Filled 2011-04-28 (×44): qty 1

## 2011-04-28 MED ORDER — OMEGA-3-ACID ETHYL ESTERS 1 G PO CAPS
1.0000 g | ORAL_CAPSULE | Freq: Two times a day (BID) | ORAL | Status: DC
  Administered 2011-04-28 – 2011-05-10 (×25): 1 g via ORAL
  Filled 2011-04-28 (×27): qty 1

## 2011-04-28 MED ORDER — QUINAPRIL HCL 10 MG PO TABS
20.0000 mg | ORAL_TABLET | Freq: Two times a day (BID) | ORAL | Status: DC
Start: 2011-04-28 — End: 2011-04-28

## 2011-04-28 MED ORDER — LEVALBUTEROL HCL 0.63 MG/3ML IN NEBU
0.6300 mg | INHALATION_SOLUTION | Freq: Four times a day (QID) | RESPIRATORY_TRACT | Status: DC
  Administered 2011-04-28 – 2011-05-02 (×13): 0.63 mg via RESPIRATORY_TRACT
  Filled 2011-04-28 (×22): qty 3

## 2011-04-28 MED ORDER — LORATADINE 10 MG PO TABS
10.0000 mg | ORAL_TABLET | Freq: Every day | ORAL | Status: DC
  Administered 2011-04-28: 10 mg via ORAL
  Filled 2011-04-28 (×13): qty 1

## 2011-04-28 NOTE — Progress Notes (Signed)
ANTICOAGULATION CONSULT NOTE - Follow Up Consult  Pharmacy Consult for Unfractionated Heparin IV Indication: atrial fibrillation  Allergies  Allergen Reactions  . Metoprolol Other (See Comments)    Bradycardia  . Codeine   . Seldane (Terfenadine)   . Statins     Patient Measurements: Height: 5' 3.5" (161.3 cm) Weight: 171 lb 11.8 oz (77.9 kg) IBW/kg (Calculated) : 53.55  Adjusted Body Weight: 71.7  Vital Signs: Temp: 97.4 F (36.3 C) (11/06 1400) Temp src: Oral (11/06 0623) BP: 110/70 mmHg (11/06 1710) Pulse Rate: 114  (11/06 1710)  Labs:  Basename 04/28/11 1608 04/28/11 0808 04/28/11 0535 04/28/11 0144 04/27/11 1941 04/27/11 1737  HGB -- -- 11.5* -- -- 12.6  HCT -- -- 36.3 -- -- 39.0  PLT -- -- 194 -- -- 227  APTT -- -- -- -- -- 39*  LABPROT -- -- -- -- -- 14.5  INR -- -- -- -- -- 1.11  HEPARINUNFRC 0.32 -- 0.28* -- -- --  CREATININE -- -- 1.18* -- -- 1.21*  CKTOTAL -- 72 -- 53 -- --  CKMB -- 2.7 -- 3.1 -- --  TROPONINI -- <0.30 -- <0.30 <0.30 --   Estimated Creatinine Clearance: 37.4 ml/min (by C-G formula based on Cr of 1.18).   Medications:  Prescriptions prior to admission  Medication Sig Dispense Refill  . amoxicillin (AMOXIL) 500 MG capsule Take 500 mg by mouth as needed. 4 capsules 1 hour prior to dental procedures       . aspirin 81 MG tablet Take 81 mg by mouth daily.        . Cholecalciferol (VITAMIN D PO) Take 2,000 mg by mouth daily.        . clopidogrel (PLAVIX) 75 MG tablet Take 75 mg by mouth daily.        Marland Kitchen diltiazem (TIAZAC) 360 MG 24 hr capsule Take 360 mg by mouth daily.        . furosemide (LASIX) 40 MG tablet Take 40 mg by mouth as needed.        Marland Kitchen levothyroxine (SYNTHROID, LEVOTHROID) 100 MCG tablet Take 100 mcg by mouth daily.        . Multiple Vitamins-Minerals (ICAPS AREDS FORMULA PO) Take by mouth 2 (two) times daily.        . Omega 3-6-9 Fatty Acids (OMEGA 3-6-9 COMPLEX PO) Take 1 tablet by mouth 2 (two) times daily.        .  potassium chloride (KLOR-CON) 20 MEQ packet Take 20 mEq by mouth as needed.        . Probiotic Product (ALIGN PO) Take by mouth daily.        . nitroGLYCERIN (NITROSTAT) 0.4 MG SL tablet Place 0.4 mg under the tongue as needed.          Assessment: 77yo female patient with afib on IV heparin. Heparin level is therapeutic for goal of 0.3 to 0.7 on 1200units/hr. Goal of Therapy:  Heparin level 0.3-0.7 units/ml   Plan:  Continue heparin IV 1200 units/hr. Follow-up am level.   Fayne Norrie 04/28/2011,5:12 PM

## 2011-04-28 NOTE — Progress Notes (Signed)
ANTICOAGULATION CONSULT NOTE   Pharmacy Consult for UFH Indication: atrial fibrillation  Allergies  Allergen Reactions  . Metoprolol Other (See Comments)    Bradycardia  . Codeine   . Seldane (Terfenadine)   . Statins     Patient Measurements: Height: 5' 3.5" (161.3 cm) Weight: 171 lb 11.8 oz (77.9 kg) IBW/kg (Calculated) : 53.55  Adjusted Body Weight: 71.7  Vital Signs: Temp: 97.6 F (36.4 C) (11/06 0623) Temp src: Oral (11/06 0623) BP: 97/60 mmHg (11/06 0623) Pulse Rate: 111  (11/06 0623)  Labs:  Basename 04/28/11 0535 04/28/11 0144 04/27/11 1941 04/27/11 1737  HGB 11.5* -- -- 12.6  HCT 36.3 -- -- 39.0  PLT 194 -- -- 227  APTT -- -- -- 39*  LABPROT -- -- -- 14.5  INR -- -- -- 1.11  HEPARINUNFRC 0.28* -- -- --  CREATININE -- -- -- 1.21*  CKTOTAL -- 53 -- --  CKMB -- 3.1 -- --  TROPONINI -- <0.30 <0.30 --   Estimated Creatinine Clearance: 36.4 ml/min (by C-G formula based on Cr of 1.21).  Medical History: Past Medical History  Diagnosis Date  . Hypertension   . Hyperlipidemia   . Heart murmur   . GERD (gastroesophageal reflux disease)   . Enlarged heart   . Arthritis   . Joint pain     left knee  . Dizziness   . Chest tightness 02/09/2008  . Hiatal hernia   . CHF (congestive heart failure)   . Carotid artery occlusion   . Shortness of breath   . Angina   . Hyperthyroidism   . Cancer   . Coronary artery disease   . Pneumonia   . Hypertrophic cardiomyopathy     subvalvular gradient 45-mm    Medications:  Prescriptions prior to admission  Medication Sig Dispense Refill  . amoxicillin (AMOXIL) 500 MG capsule Take 500 mg by mouth as needed. 4 capsules 1 hour prior to dental procedures       . aspirin 81 MG tablet Take 81 mg by mouth daily.        . Cholecalciferol (VITAMIN D PO) Take 2,000 mg by mouth daily.        . clopidogrel (PLAVIX) 75 MG tablet Take 75 mg by mouth daily.        Marland Kitchen diltiazem (TIAZAC) 360 MG 24 hr capsule Take 360 mg by mouth  daily.        . furosemide (LASIX) 40 MG tablet Take 40 mg by mouth as needed.        Marland Kitchen levothyroxine (SYNTHROID, LEVOTHROID) 100 MCG tablet Take 100 mcg by mouth daily.        . Multiple Vitamins-Minerals (ICAPS AREDS FORMULA PO) Take by mouth 2 (two) times daily.        . Omega 3-6-9 Fatty Acids (OMEGA 3-6-9 COMPLEX PO) Take 1 tablet by mouth 2 (two) times daily.        . potassium chloride (KLOR-CON) 20 MEQ packet Take 20 mEq by mouth as needed.        . Probiotic Product (ALIGN PO) Take by mouth daily.        . nitroGLYCERIN (NITROSTAT) 0.4 MG SL tablet Place 0.4 mg under the tongue as needed.          Assessment: 77 y/o female patient with afib for Heparin.  Heparin level subtherapeutic Goal of Therapy:  Heparin level 0.3-0.7 units/ml   Plan:  Increase Heparin 1200 units/hr.  F/U 8 hr level.  Eddie Candle 04/28/2011,6:42 AM

## 2011-04-28 NOTE — Progress Notes (Signed)
Utilization Review Complete.  Oletta Cohn 04/28/2011.

## 2011-04-28 NOTE — Progress Notes (Signed)
ANTIBIOTIC CONSULT NOTE - INITIAL  Pharmacy Consult for Oral Vancomycin  Indication: C diff  Allergies  Allergen Reactions  . Metoprolol Other (See Comments)    Bradycardia  . Codeine   . Seldane (Terfenadine)   . Statins     Patient Measurements: Height: 5' 3.5" (161.3 cm) Weight: 171 lb 11.8 oz (77.9 kg) IBW/kg (Calculated) : 53.55    Vital Signs: Temp: 97.4 F (36.3 C) (11/06 1400) BP: 110/70 mmHg (11/06 1710) Pulse Rate: 114  (11/06 1710) Intake/Output from previous day:   Intake/Output from this shift:    Labs:  [facility] 04/28/11 0535 04/27/11 1737  WBC 9.9 10.3  HGB 11.5* 12.6  PLT 194 227  LABCREA -- --  CREATININE 1.18* 1.21*   Estimated Creatinine Clearance: 37.4 ml/min (by C-G formula based on Cr of 1.18). No results found for this basename: VANCOTROUGH:2,VANCOPEAK:2,VANCORANDOM:2,GENTTROUGH:2,GENTPEAK:2,GENTRANDOM:2,TOBRATROUGH:2,TOBRAPEAK:2,TOBRARND:2,AMIKACINPEAK:2,AMIKACINTROU:2,AMIKACIN:2, in the last 72 hours   Microbiology: Recent Results (from the past 720 hour(s))  CULTURE, SPUTUM-ASSESSMENT     Status: Normal   Collection Time   04/28/11 10:23 AM      Component Value Range Status Comment   Specimen Description SPUTUM   Final    Special Requests NONE   Final    Sputum evaluation     Final    Value: MICROSCOPIC FINDINGS SUGGEST THAT THIS SPECIMEN IS NOT REPRESENTATIVE OF LOWER RESPIRATORY SECRETIONS. PLEASE RECOLLECT.     CALLED TO POWELL S RN 04/28/11 1150 COSTELLO B   Report Status 04/28/2011 FINAL   Final   CLOSTRIDIUM DIFFICILE BY PCR     Status: Abnormal   Collection Time   04/28/11  2:17 PM      Component Value Range Status Comment   C difficile by pcr POSITIVE (*) NEGATIVE  Final     Medical History: Past Medical History  Diagnosis Date  . Hypertension   . Hyperlipidemia   . Heart murmur   . GERD (gastroesophageal reflux disease)   . Enlarged heart   . Arthritis   . Joint pain     left knee  . Dizziness   . Chest tightness  02/09/2008  . Hiatal hernia   . CHF (congestive heart failure)   . Carotid artery occlusion   . Shortness of breath   . Angina   . Hyperthyroidism   . Cancer   . Coronary artery disease   . Pneumonia   . Hypertrophic cardiomyopathy     subvalvular gradient 45-mm    Medications:  Prescriptions prior to admission  Medication Sig Dispense Refill  . amoxicillin (AMOXIL) 500 MG capsule Take 500 mg by mouth as needed. 4 capsules 1 hour prior to dental procedures       . aspirin 81 MG tablet Take 81 mg by mouth daily.        . Cholecalciferol (VITAMIN D PO) Take 2,000 mg by mouth daily.        . clopidogrel (PLAVIX) 75 MG tablet Take 75 mg by mouth daily.        Marland Kitchen diltiazem (TIAZAC) 360 MG 24 hr capsule Take 360 mg by mouth daily.        . furosemide (LASIX) 40 MG tablet Take 40 mg by mouth as needed.        Marland Kitchen levothyroxine (SYNTHROID, LEVOTHROID) 100 MCG tablet Take 100 mcg by mouth daily.        . Multiple Vitamins-Minerals (ICAPS AREDS FORMULA PO) Take by mouth 2 (two) times daily.        Marland Kitchen  Omega 3-6-9 Fatty Acids (OMEGA 3-6-9 COMPLEX PO) Take 1 tablet by mouth 2 (two) times daily.        . potassium chloride (KLOR-CON) 20 MEQ packet Take 20 mEq by mouth as needed.        . Probiotic Product (ALIGN PO) Take by mouth daily.        . nitroGLYCERIN (NITROSTAT) 0.4 MG SL tablet Place 0.4 mg under the tongue as needed.         Assessment: 77 y/o female known to [place] for IV heparin now C diff (+) to start po vancomycin. MD has started po flagyl in addition.   Goal of Therapy:  Clinical resolution  Plan:  Start vancomycin 125mg  po QID. Will follow-up antibiotic plan and clinical resolution.   Fayne Norrie 04/28/2011,6:34 PM

## 2011-04-28 NOTE — Progress Notes (Signed)
  Echocardiogram 2D Echocardiogram has been performed.  Mercy Moore 04/28/2011, 2:23 PM

## 2011-04-28 NOTE — Progress Notes (Signed)
Subjective: The patient denies chest pain, shortness of breath, or increased heart rate.  Objective: Vital signs in last 24 hours: Temp:  [97.6 F (36.4 C)-98.5 F (36.9 C)] 97.6 F (36.4 C) (11/06 0623) Pulse Rate:  [97-119] 111  (11/06 0623) Resp:  [18-23] 20  (11/06 0623) BP: (97-151)/(55-145) 97/60 mmHg (11/06 0623) SpO2:  [92 %-99 %] 95 % (11/06 0806) Weight:  [77.9 kg (171 lb 11.8 oz)-82.555 kg (182 lb)] 171 lb 11.8 oz (77.9 kg) (11/05 2353) Weight change:  Last BM Date: 04/28/11 Intake/Output from previous day:   Intake/Output this shift:    PE: Gen.: Alert oriented female no acute distress. Heart: S1-S2 irregularly irregular, with 2-3/6 systolic murmur. Lungs: Rhonchi scattered throughout no wheezes noted. Abdomen: Soft nontender positive bowel sounds. Do not palpate liver spleen or masses. Extremities: Trace lower extremity edema bilaterally. Pedal pulses 1+. Neuro: Alert and oriented x3, follows commands.  Lab Results:  [facility] 04/28/11 0535 04/27/11 1737  WBC 9.9 10.3  HGB 11.5* 12.6  HCT 36.3 39.0  PLT 194 227   BMET  Basename 04/28/11 0535 04/27/11 1737  NA 138 140  K 3.6 3.6  CL 101 100  CO2 27 28  GLUCOSE 119* 116*  BUN 21 21  CREATININE 1.18* 1.21*  CALCIUM 9.0 9.1    Basename 04/28/11 0808 04/28/11 0144  TROPONINI <0.30 <0.30    Lab Results  Component Value Date   CHOL  Value: 247        ATP III CLASSIFICATION:  <200     mg/dL   Desirable  409-811  mg/dL   Borderline High  >=914    mg/dL   High       * 7/82/9562   HDL 37* 10/14/2009   LDLCALC  Value: 156        Total Cholesterol/HDL:CHD Risk Coronary Heart Disease Risk Table                     Men   Women  1/2 Average Risk   3.4   3.3  Average Risk       5.0   4.4  2 X Average Risk   9.6   7.1  3 X Average Risk  23.4   11.0        Use the calculated Patient Ratio above and the CHD Risk Table to determine the patient's CHD Risk.        ATP III CLASSIFICATION (LDL):  <100     mg/dL   Optimal   130-865  mg/dL   Near or Above                    Optimal  130-159  mg/dL   Borderline  784-696  mg/dL   High  >295     mg/dL   Very High* 2/84/1324   TRIG 269* 10/14/2009   CHOLHDL 6.7 10/14/2009   Lab Results  Component Value Date    HGBA1C  Value: 5.9 (NOTE)                                                                       According to the ADA Clinical Practice Recommendations for 2011, when HbA1c is used as a  screening test:   >=6.5%   Diagnostic of Diabetes Mellitus           (if abnormal result  is confirmed)  5.7-6.4%   Increased risk of developing Diabetes Mellitus  References:Diagnosis and Classification of Diabetes Mellitus,Diabetes Care,2011,34(Suppl 1):S62-S69 and Standards of Medical Care in         Diabetes - 2011,Diabetes Care,2011,34  (Suppl 1):S11-S61.* 10/14/2009       Hepatic Function Panel  Basename 04/27/11 1737  PROT 7.4  ALBUMIN 3.4*  AST 19  ALT 14  ALKPHOS 80  BILITOT 0.3  BILIDIR --  IBILI --   No results found for this basename: CHOL in the last 72 hours No results found for this basename: PROTIME in the last 72 hours    EKG: Orders placed during the hospital encounter of 04/27/11  . EKG 12-LEAD  . EKG 12-LEAD  . EKG 12-LEAD    Studies/Results: Dg Chest Portable 1 View  04/28/2011  *RADIOLOGY REPORT*  Clinical Data: CHF, shortness of breath  PORTABLE CHEST - 1 VIEW  Comparison: 04/27/2011; 03/11/2011; 11/20/2008  Findings: Grossly unchanged enlarged cardiac silhouette and mediastinal contours post median sternotomy.  Minimal worsening of right peri and infrahilar heterogeneous opacities.  Pulmonary vasculature is less distinct on the present examination.  No definite pleural effusion or pneumothorax.  Grossly unchanged bones.  IMPRESSION: 1.  Increased right perihilar and infrahilar heterogeneous opacities, possibly atelectasis though underlying infection is not excluded. Continued attention on follow-up is recommended.  2.  Findings suggestive of  worsening pulmonary edema.  Original Report Authenticated By: Waynard Reeds, M.D.   Dg Chest Portable 1 View  04/27/2011  *RADIOLOGY REPORT*  Clinical Data: Shortness of breath, heart murmur, chest tightness, dizziness, history of enlarged heart and stenting  PORTABLE CHEST - 1 VIEW  Comparison: Portable exam 1800 hours compared to 03/11/2011  Findings: Enlargement of cardiac silhouette post CABG. Atherosclerotic calcification aortic arch. Pulmonary vascular congestion. Right basilar atelectasis versus infiltrate. Remaining lungs clear. No gross pleural effusion or pneumothorax.  IMPRESSION: Enlargement of cardiac silhouette with pulmonary vascular congestion post CABG. Right basilar atelectasis versus infiltrate.  Original Report Authenticated By: Lollie Marrow, M.D.    Medications: I have reviewed the patient's current medications.  Assessment/Plan: Patient Active Problem List  Diagnoses  . Hypertension  . Hyperlipidemia  . Heart murmur  . GERD (gastroesophageal reflux disease)  . Chest tightness  . CHF (congestive heart failure)  . Shortness of breath  . Hyperthyroidism  . Coronary artery disease  . Pneumonia  . Hypertrophic cardiomyopathy   Plan: patient continues in atrial fibrillation fairly rate controlled with heart rates 90-110.she is on IV Cardizem and IV heparin. Chest x-ray reveals worsening congestive heart failure. Will add IV diuretic to medications. Patient also complains of some diarrhea will check C. Difficile since she has been on antibiotics as an outpatient. Cardiac enzymes were negative for myocardial infarction. She is on antibiotic for pneumonia.  Please note the patient was not taking Tekturner  prior to admission I have discontinued  from her medications. Nor was patient on ACE inhibitor. With patient's hypertrophic cardiomyopathy with sub- valvular gradient of 58 mmHg per echo August 2012.   LOS: 1 day   INGOLD,LAURA R 04/28/2011, 10:21 AM  Agree with  note written by Nada Boozer RNP  Diuresing. Getting IV lasix. On IV dilt and rocephin. Other labs OK. Plan is for TEE DCCV. Will need to address issue of coumadin A/C.   Runell Gess  04/28/2011 3:32 PM

## 2011-04-29 DIAGNOSIS — A0472 Enterocolitis due to Clostridium difficile, not specified as recurrent: Secondary | ICD-10-CM

## 2011-04-29 LAB — BASIC METABOLIC PANEL
CO2: 29 mEq/L (ref 19–32)
Calcium: 8.5 mg/dL (ref 8.4–10.5)
Chloride: 97 mEq/L (ref 96–112)
Glucose, Bld: 118 mg/dL — ABNORMAL HIGH (ref 70–99)
Potassium: 3.6 mEq/L (ref 3.5–5.1)
Sodium: 135 mEq/L (ref 135–145)

## 2011-04-29 LAB — CBC
HCT: 35.2 % — ABNORMAL LOW (ref 36.0–46.0)
Hemoglobin: 11.1 g/dL — ABNORMAL LOW (ref 12.0–15.0)
MCV: 94.9 fL (ref 78.0–100.0)
RBC: 3.71 MIL/uL — ABNORMAL LOW (ref 3.87–5.11)
WBC: 10 10*3/uL (ref 4.0–10.5)

## 2011-04-29 LAB — MAGNESIUM: Magnesium: 1.6 mg/dL (ref 1.5–2.5)

## 2011-04-29 MED ORDER — DEXTROSE 5 % IV SOLN
60.0000 mg/h | INTRAVENOUS | Status: AC
  Filled 2011-04-29: qty 9

## 2011-04-29 MED ORDER — DEXTROSE 5 % IV SOLN
30.0000 mg/h | INTRAVENOUS | Status: AC
  Filled 2011-04-29 (×3): qty 9

## 2011-04-29 MED ORDER — AMIODARONE LOAD VIA INFUSION
150.0000 mg | Freq: Once | INTRAVENOUS | Status: AC
  Administered 2011-04-29: 150 mg via INTRAVENOUS
  Filled 2011-04-29: qty 151.2

## 2011-04-29 MED ORDER — MAGNESIUM OXIDE 400 MG PO TABS
400.0000 mg | ORAL_TABLET | Freq: Two times a day (BID) | ORAL | Status: AC
  Administered 2011-04-29 – 2011-05-01 (×5): 400 mg via ORAL
  Filled 2011-04-29 (×6): qty 1

## 2011-04-29 MED ORDER — DILTIAZEM HCL 50 MG/10ML IV SOLN
10.0000 mg | Freq: Once | INTRAVENOUS | Status: AC
  Administered 2011-04-29: 10 mg via INTRAVENOUS

## 2011-04-29 MED ORDER — AMIODARONE HCL 150 MG/3ML IV SOLN: 150.0000 mg | INTRAVENOUS | Status: DC

## 2011-04-29 NOTE — Progress Notes (Signed)
ANTICOAGULATION CONSULT NOTE - Follow Up Consult  Pharmacy Consult for Heparin Indication: atrial fibrillation  Allergies  Allergen Reactions  . Metoprolol Other (See Comments)    Bradycardia  . Codeine   . Seldane (Terfenadine)   . Statins     myalgia    Patient Measurements: Height: 5' 3.5" (161.3 cm) Weight: 192 lb 7.4 oz (87.3 kg) IBW/kg (Calculated) : 53.55    Vital Signs: Temp: 99.7 F (37.6 C) (11/07 0814) Temp src: Oral (11/07 0814) BP: 135/96 mmHg (11/07 0814) Pulse Rate: 105  (11/07 0643)  Labs:  Basename 04/29/11 0540 04/28/11 1620 04/28/11 1608 04/28/11 0808 04/28/11 0535 04/28/11 0144 04/27/11 1737  HGB 11.1* -- -- -- 11.5* -- --  HCT 35.2* -- -- -- 36.3 -- 39.0  PLT 224 -- -- -- 194 -- 227  APTT -- -- -- -- -- -- 39*  LABPROT -- -- -- -- -- -- 14.5  INR -- -- -- -- -- -- 1.11  HEPARINUNFRC 0.56 -- 0.32 -- 0.28* -- --  CREATININE 1.14* -- -- -- 1.18* -- 1.21*  CKTOTAL -- 64 -- 72 -- 53 --  CKMB -- 2.8 -- 2.7 -- 3.1 --  TROPONINI -- <0.30 -- <0.30 -- <0.30 --   Estimated Creatinine Clearance: 41 ml/min (by C-G formula based on Cr of 1.14).   Medications:  Scheduled:    . aspirin  81 mg Oral Daily  . azithromycin  500 mg Oral Daily   Followed by  . azithromycin  250 mg Oral Daily  . calcium-vitamin D  1 tablet Oral Daily  . cholecalciferol  400 Units Oral Daily  . clopidogrel  75 mg Oral Daily  . furosemide  40 mg Intravenous BID  . levalbuterol  0.63 mg Nebulization Q6H  . levothyroxine  100 mcg Oral Daily  . loratadine  10 mg Oral Daily  . metoprolol tartrate  6.25 mg Oral BID  . metroNIDAZOLE  500 mg Oral Q6H  . omega-3 acid ethyl esters  1 g Oral BID  . pantoprazole  40 mg Oral Q1200  . potassium chloride  20 mEq Oral Daily  . saccharomyces boulardii  250 mg Oral BID  . vancomycin  125 mg Oral Q6H  . DISCONTD: aliskiren  300 mg Oral Daily  . DISCONTD: cefTRIAXone (ROCEPHIN) IV  1 g Intravenous Q24H  . DISCONTD: furosemide  40 mg  Intravenous Daily  . DISCONTD: lisinopril  20 mg Oral BID  . DISCONTD: metroNIDAZOLE  500 mg Oral TID  . DISCONTD: quinapril  20 mg Oral BID    Assessment: 77 year old on heparin for afib with no bleeding noted.  Heparin level this AM is therapeutic at 0.56  Goal of Therapy:  Heparin level 0.3-0.7 units/ml   Plan:   Continue heparin at 1200 units / hr Follow up anti-coagulation plan   Carla Black 04/29/2011,8:52 AM

## 2011-04-29 NOTE — Progress Notes (Signed)
Clinical Social Worker completed the psychosocial assessment which can be found in the shadow chart. Clinical Social Work will sign off for now as social work intervention is no longer needed. Please consult [place] again if need arises.

## 2011-04-29 NOTE — Progress Notes (Signed)
Pt being tx. To 3300. Pt asked if husband could be called and she said no. Report called to nurse on 3300.

## 2011-04-29 NOTE — Progress Notes (Signed)
pts HR 130s-140s fib, BP 112/60 manually; pt with no c/o chest pain or SOB; Corine Shelter, [place] called and made aware, no new orders received, will continue to monitor

## 2011-04-29 NOTE — Progress Notes (Signed)
Pt had a loose stool. When cleaning up the pt a moderate amount of  frank blood was noticed in the [facility] and when cleaning the pt. Md made aware. Will cont to monitor pt.

## 2011-04-29 NOTE — Progress Notes (Signed)
Subjective:  Diarrhea better. Dry cough, no increased SOB  Objective:  Vital Signs in the last 24 hours: Temp:  [97.4 F (36.3 C)-99.7 F (37.6 C)] 99.7 F (37.6 C) (11/07 0814) Pulse Rate:  [100-114] 105  (11/07 0643) Resp:  [18-22] 22  (11/07 0814) BP: (97-135)/(58-96) 135/96 mmHg (11/07 0814) SpO2:  [96 %-99 %] 98 % (11/07 0643) FiO2 (%):  [2 %] 2 % (11/06 1400) Weight:  [87.3 kg (192 lb 7.4 oz)] 192 lb 7.4 oz (87.3 kg) (11/07 0643)  Intake/Output from previous day: 11/06 0701 - 11/07 0700 In: 938.1 [P.O.:480; I.V.:458.1] Out: 1303 [Urine:1300; Stool:3] Intake/Output from this shift:    Physical Exam: General: tired, but NAD, A&O x 3 Lungs: diminished breath sounds on Rt base. Heart: irreg irreg with 2/6 syst m LSB Extremities: no edema Abd: soft, nt,nd, increased bowel sounds throughout.  Lab Results:  [facility] 04/29/11 0540 04/28/11 0535  WBC 10.0 9.9  HGB 11.1* 11.5*  PLT 224 194    Basename 04/29/11 0540 04/28/11 0535  NA 135 138  K 3.6 3.6  CL 97 101  CO2 29 27  GLUCOSE 118* 119*  BUN 22 21  CREATININE 1.14* 1.18*    Basename 04/28/11 1620 04/28/11 0808  TROPONINI <0.30 <0.30   Hepatic Function Panel  Basename 04/27/11 1737  PROT 7.4  ALBUMIN 3.4*  AST 19  ALT 14  ALKPHOS 80  BILITOT 0.3  BILIDIR --  IBILI --   No results found for this basename: CHOL in the last 72 hours No results found for this basename: PROTIME in the last 72 hours  Imaging  Cardiac Studies: 2D done, Severe LVH, Grade 1 diastolic dysfunction, EF 45-50, mod severe MR  MAR reviewed.  Assessment/Plan:  Atrial Fibrillation with rapid VR, HR 100-140 HCM, past LVOT gradient 45-50 Mod-Severe MR URI, ? Pnuemonia, on ABs CAD,  Dx/Cfx stents 4/04            ISR PCI 6/10            Cath 03/13/2011, 99% small RCA otherwise non-obstructive CAD HTN/ HCVD TIA/CVA 4/11 PVD 40-60 RICA 8/10 C.Diff colitis this adm,  ABs started Acute diastolic CHF, on IV lasix, BNP 5822,  CXR 11/6 CHF Hx statin intol, myalgia. Hx Beta Blocker intol, bradycardia   Plan- increase diltiazem IV to 10mg /hr, low dose beta blocker added yesterday, replace Mg (1.6).           TEE/CV can not be done today, ? Schedule for Friday. Continue IV lasix.  LOS: 2 days    KILROY,LUKE K PA-C 04/29/2011, 8:26 AM   I have seen and examined the patient along with Corine Shelter, PA.  I have reviewed the chart, notes and new data.  I agree with Luke's note.  Key new complaints: Increased Afib Rate.  C-diff  Key examination changes: HR now in 140s   PLAN: IV Diltiazem bolus in addition to increase drip rate.  TEE/DCCV delayed due to scheduling issue.  But keep NPO after MN to see if able to be done tomorrow by Dr. Royann Shivers. Acute on chronic diastolic CHF - Continue IV Lasix for at least 1-2 more days. Continue IV heparin.  If rate continues to be increased - may consider Amiodarone. Abx PNA with CHF - Rocephin;  C-Diff - per ID, PO Flagyl.   HARDING,DAVID W 04/29/2011, 4:57 PM

## 2011-04-29 NOTE — Progress Notes (Deleted)
Pt had a loose stool when cleaning up the pt a moderate amount of loose stool was noticed in the [facility] and while whipping the pt. Md made aware. Will cont to monitor pt.

## 2011-04-29 NOTE — Consult Note (Signed)
Infectious Diseases Consultation  Reason for Consult: c.difficile colitis  Date of Consultation: 04/29/11  Carla Black is an 77 y.o. female with new onset atrial fibrillation, hypertension and CHF presents with 1 week history of dry cough that was initially intermittent but becoming worse. She denies having fevers initial but did have nightsweats. She was seen by PCP 2 days prior to admit where she was diagnosed with pneumonia and given IM ceftriaxone and started azithromycin. She subscribes that her cough became productive, whitish sputum. She also noticed having new onset orthopnea and dyspnea on exertion but no worsening of lower extremity edema, however, she takes lasix. She then noticed developing to have abdominal cramping and   diarrhea since starting on antibiotics with 3-4 episodes a day. She denies n/v, but she does have fatigue. She recently started on various heart medications for  Presumed tachy/brady syndrome and being followed by her cardiologist for possible pacemaker. On day of admission, patient found to be in afib with RVR, febrile. Her infectious work-up showed that she had a slight leukocytosis of 11K. CXR suggestive of CHF and no specific infiltrate. UA negative. Stool specimen positive for CDiff. She was subsequently started on both oral metronidazole and vancomycin for which she states she feels much improved since starting medications. She has less diarrheal episodes, but still loose. No abdominal cramping. Does not have fevers or nightsweats. Still has cough.   Past Medical History  Diagnosis Date  . Hypertension   . Hyperlipidemia   . Heart murmur   . GERD (gastroesophageal reflux disease)   . Enlarged heart   . Arthritis   . Joint pain     left knee  . Dizziness   . Chest tightness 02/09/2008  . Hiatal hernia   . CHF (congestive heart failure)   . Carotid artery occlusion   . Shortness of breath   . Angina   . Hyperthyroidism   . Cancer   . Coronary artery  disease   . Pneumonia   . Hypertrophic cardiomyopathy     subvalvular gradient 45-mm    Past Surgical History  Procedure Date  . Appendectomy     and tonsillectomy  . Abdominal hysterectomy   . Malignant melanoma removal 1961  . Abdominal surgery     benign tumor removed  . Vocal cord surgery     vocal cord node removed  . Thymus gland tumor resection 1991  . Cardiac catheterization     Stent CX 40% with nml ffr    Family History  Problem Relation Age of Onset  . Heart disease Mother   . Other Father     aneurysm  . Coronary artery disease Other   . Stroke Other     Social History:  reports that she quit smoking about 21 years ago. Her smoking use included Cigarettes. She does not have any smokeless tobacco history on file. She reports that she drinks about .6 ounces of alcohol per week. She reports that she does not use illicit drugs. She lives in assisted living with her husband.  Allergies:  Allergies  Allergen Reactions  . Metoprolol Other (See Comments)    Bradycardia  . Codeine   . Seldane (Terfenadine)   . Statins     myalgia   Abtx: Ceftriaxone x 2 doses Azithromycin 250mg  PO daily Metronidazole 500mg  Q6hr PO Vancomycin 125mg  PO Q6hr   Medications: I have reviewed the patient's current medications. Labs: CBC    Component Value Date/Time   WBC 10.0  04/29/2011 0540   RBC 3.71* 04/29/2011 0540   HGB 11.1* 04/29/2011 0540   HCT 35.2* 04/29/2011 0540   PLT 224 04/29/2011 0540   MCV 94.9 04/29/2011 0540   MCH 29.9 04/29/2011 0540   MCHC 31.5 04/29/2011 0540   RDW 15.0 04/29/2011 0540   LYMPHSABS 2.9 04/27/2011 1737   MONOABS 1.4* 04/27/2011 1737   EOSABS 0.3 04/27/2011 1737   BASOSABS 0.1 04/27/2011 1737    CMP     Component Value Date/Time   NA 135 04/29/2011 0540   K 3.6 04/29/2011 0540   CL 97 04/29/2011 0540   CO2 29 04/29/2011 0540   GLUCOSE 118* 04/29/2011 0540   BUN 22 04/29/2011 0540   CREATININE 1.14* 04/29/2011 0540   CALCIUM 8.5 04/29/2011 0540     PROT 7.4 04/27/2011 1737   ALBUMIN 3.4* 04/27/2011 1737   AST 19 04/27/2011 1737   ALT 14 04/27/2011 1737   ALKPHOS 80 04/27/2011 1737   BILITOT 0.3 04/27/2011 1737   GFRNONAA 44* 04/29/2011 0540   GFRAA 51* 04/29/2011 0540   ua negative   Micro Sputum culture: NGTD cdifficile positive  Imaging:  cxr 11/5: Enlargement of cardiac silhouette with pulmonary vascular congestion post CABG. Right basilar atelectasis versus infiltrate.  cxr 11/6:   Increased right perihilar and infrahilar heterogeneous opacities, possibly atelectasis though underlying infection is not excluded. Continued attention on follow-up is recommended.  2.  Findings suggestive of worsening pulmonary edema.   Review of Systems  Constitutional: Positive for malaise/fatigue and diaphoresis. Negative for fever, chills and weight loss.  HENT: Negative for congestion, sore throat and ear discharge.   Eyes: Negative.   Respiratory: Positive for cough and shortness of breath. Negative for hemoptysis, sputum production and wheezing.   Cardiovascular: Positive for palpitations, orthopnea and leg swelling. Negative for claudication.  Gastrointestinal: Positive for diarrhea. Negative for nausea, vomiting and abdominal pain.  Genitourinary: Negative for dysuria, urgency and frequency.  Musculoskeletal: Negative for myalgias.  Neurological: Negative.   Endo/Heme/Allergies: Negative.   Psychiatric/Behavioral: Negative.    Blood pressure 117/58, pulse 135, temperature 99.7 F (37.6 C), temperature source Oral, resp. rate 22, height 5' 3.5" (1.613 m), weight 87.3 kg (192 lb 7.4 oz), SpO2 92.00%. Physical Exam  Constitutional: She is oriented to person, place, and time. She appears well-developed. No distress.  HENT:  Head: Normocephalic and atraumatic.  Nose: Nose normal.  Mouth/Throat: Oropharynx is clear and moist. No oropharyngeal exudate.  Eyes: Conjunctivae and EOM are normal. Pupils are equal, round, and reactive to light.  Right eye exhibits no discharge. Left eye exhibits no discharge. No scleral icterus.  Neck: Normal range of motion. No JVD present.  Cardiovascular: Intact distal pulses.  Exam reveals no gallop and no friction rub.   Murmur heard.      Irreg, irreg  Respiratory: No respiratory distress. She has no wheezes. She has rales.  GI: Soft. Bowel sounds are normal. She exhibits no distension. There is no tenderness. There is no rebound.  Musculoskeletal: Normal range of motion. She exhibits edema. She exhibits no tenderness.  Neurological: She is alert and oriented to person, place, and time. She has normal reflexes. She displays normal reflexes. No cranial nerve deficit. She exhibits normal muscle tone. Coordination normal.  Skin: Skin is warm and dry. She is not diaphoretic.  Psychiatric: She has a normal mood and affect.    Assessment/Plan:77 yo F with CHF, presents with productive cough initially treated for CAP, but concerned for CHF exacerbation but  also having diarrhea, c/w c.difficile infection.  1)CDI: since patient does not exhibit signs of severe CDI, no leukocytosis, no elevated Cr, we would recommend doing a trial of 14 days with metronidazole 500mg  Q6hr. Can discontinue oral vancomycin. Will use oral vanco if she is not responding to metronidazole. Diarrhea at this point is improving only 3 stools in last 24hrs. In acute setting, will not recommend use of probiotics. 2) CAP: would finish off treatment today. Symptoms sound like they would be more consistent with CHF exacerbation. She finishes a 5 day course of tx for CAP. If she does have continued productive cough, with fever, would send off sputum culture. Since she resides in assisted living, she may benefit from HCAP regimens in the future. 3) Risk factors for cdi: please discontinue ppi since it has been associated with cdi 4) diarrhea = can consider replacing magnesium with IV formulation to minimize diarrheal side effect associated  with magnesium oxide.  It has been a pleasure to see Mrs. Juliana in consultation  Judyann Munson 04/29/2011, 2:53 PM

## 2011-04-30 ENCOUNTER — Ambulatory Visit: Payer: Self-pay | Admitting: Thoracic Diseases

## 2011-04-30 ENCOUNTER — Other Ambulatory Visit: Payer: Self-pay

## 2011-04-30 LAB — CBC
MCH: 29.5 pg (ref 26.0–34.0)
MCHC: 31.3 g/dL (ref 30.0–36.0)
RDW: 15 % (ref 11.5–15.5)

## 2011-04-30 LAB — HEMOGLOBIN AND HEMATOCRIT, BLOOD
HCT: 34.2 % — ABNORMAL LOW (ref 36.0–46.0)
Hemoglobin: 11 g/dL — ABNORMAL LOW (ref 12.0–15.0)

## 2011-04-30 MED ORDER — DILTIAZEM HCL 100 MG IV SOLR
INTRAVENOUS | Status: AC
  Administered 2011-04-30: 21:00:00
  Filled 2011-04-30: qty 100

## 2011-04-30 MED ORDER — DILTIAZEM HCL 100 MG IV SOLR
15.0000 mg/h | INTRAVENOUS | Status: DC
  Administered 2011-05-01: 15 mg/h via INTRAVENOUS
  Filled 2011-04-30 (×2): qty 100

## 2011-04-30 MED ORDER — DILTIAZEM HCL 100 MG IV SOLR
INTRAVENOUS | Status: AC
  Administered 2011-04-30: 01:00:00
  Filled 2011-04-30: qty 100

## 2011-04-30 MED ORDER — RIVAROXABAN 10 MG PO TABS
10.0000 mg | ORAL_TABLET | Freq: Every day | ORAL | Status: DC
  Administered 2011-04-30: 10 mg via ORAL
  Filled 2011-04-30 (×2): qty 1

## 2011-04-30 MED ORDER — PANTOPRAZOLE SODIUM 40 MG PO TBEC
40.0000 mg | DELAYED_RELEASE_TABLET | Freq: Every day | ORAL | Status: DC
  Administered 2011-04-30 – 2011-05-07 (×8): 40 mg via ORAL
  Filled 2011-04-30 (×8): qty 1

## 2011-04-30 MED ORDER — DILTIAZEM HCL 50 MG/10ML IV SOLN
20.0000 mg | Freq: Once | INTRAVENOUS | Status: AC
  Administered 2011-04-30: 20 mg via INTRAVENOUS
  Filled 2011-04-30: qty 4

## 2011-04-30 NOTE — Progress Notes (Signed)
  Subjective:  tolerating AF, less SOB.  Objective:  Vital Signs in the last 24 hours: Temp:  [97.3 F (36.3 C)-98.6 F (37 C)] 97.3 F (36.3 C) (11/08 0800) Pulse Rate:  [80-135] 82  (11/08 0800) Resp:  [14-24] 14  (11/08 0800) BP: (95-121)/(46-71) 119/49 mmHg (11/08 0800) SpO2:  [92 %-100 %] 96 % (11/08 0831)  Intake/Output from previous day: 11/07 0701 - 11/08 0700 In: 794.1 [P.O.:240; I.V.:554.1] Out: 576 [Urine:575; Stool:1]  Physical Exam: General appearance: alert, cooperative and no distress Lungs: few basilar rales bilat Heart: irregularly irregular rhythm Extremities: extremities normal, atraumatic, no cyanosis or edema   Rate: 90-135  Rhythm: atrial fibrillation  Lab Results:  Basename 04/30/11 0511 04/29/11 2349 04/29/11 0540  WBC 9.3 -- 10.0  HGB 10.0* 11.0* --  PLT 211 -- 224    Basename 04/29/11 0540 04/28/11 0535  NA 135 138  K 3.6 3.6  CL 97 101  CO2 29 27  GLUCOSE 118* 119*  BUN 22 21  CREATININE 1.14* 1.18*    Basename 04/28/11 1620 04/28/11 0808  TROPONINI <0.30 <0.30   Hepatic Function Panel  Basename 04/27/11 1737  PROT 7.4  ALBUMIN 3.4*  AST 19  ALT 14  ALKPHOS 80  BILITOT 0.3  BILIDIR --  IBILI --   No results found for this basename: CHOL in the last 72 hours No results found for this basename: PROTIME in the last 72 hours  Imaging: No results found.  Cardiac Studies: EF 45-50 by 2D this adm  Assessment/Plan:   Atrial Fibrillation with rapid VR, HR 100-140  HCM, past LVOT gradient 45-50  Mod-Severe MR  URI, ? Pneumonia this adm., treated CAD, Dx/Cfx stents 4/04 ISR PCI 6/11 Cath 03/13/2011, 99% small RCA otherwise non-obstructive CAD  HTN/ HCVD  TIA/CVA 4/11  PVD 40-60 RICA 8/10  C.Diff colitis this adm, ABs started  Acute diastolic CHF, on IV lasix, BNP 5822, CXR 11/6 CHF  Hx statin intol, myalgia.  Hx Beta Blocker intol, bradycardia  Plan: Amio started last pm secondary to continued high rates. Will discuss  with MD. Start Heparin      Corine Shelter PA-C 04/30/2011, 11:29 AM    Agree with Corine Shelter, PAC's note. She appears to be near euvolemic now. She will benefit from DC-CV and restoration of SR. Risks and benefits of TEE and CV and sedation were reviewed in detail and she agrees to proceed.  After cardioversion will require anticoagulation (preferably lifelong, minimum of 30 days). Warfarin will be a problem due to drug interactions (antibiotics, newly started amiodarone) To expedite return home will start Xarelto. If too expensive will change to warfarin as outpatient. Will stop clopidogrel (last stent procedure was 18 months ago).

## 2011-04-30 NOTE — Progress Notes (Signed)
INFECTIOUS DISEASES PROGRESS NOTE  Subjective: Feeling better, having less bowel movements. Cough is improved since last night. Moved to different room due to my heart. Patient started on amiodarone and diltiazam gtt.  no fevers/chills/nightsweats  Abtx: metronidazole 500mg  QID D#2  Medications: I have reviewed the patient's current medications.  Objective: Vital signs in last 24 hours: Temp:  [97.3 F (36.3 C)-98.6 F (37 C)] 97.9 F (36.6 C) (11/08 1200) Pulse Rate:  [80-135] 97  (11/08 1200) Resp:  [14-24] 20  (11/08 1200) BP: (95-121)/(46-71) 118/70 mmHg (11/08 1200) SpO2:  [94 %-100 %] 95 % (11/08 1438) Weight change:  Last BM Date: 04/29/11  Physical Exam  Constitutional: She is oriented to person, place, and time. She appears well-developed. No distress.  HENT:  Head: Normocephalic and atraumatic.  Nose: Nose normal.  Mouth/Throat: Oropharynx is clear and moist. No oropharyngeal exudate.  Eyes: Conjunctivae and EOM are normal. Pupils are equal, round, and reactive to light. Right eye exhibits no discharge. Left eye exhibits no discharge. No scleral icterus.  Neck: Normal range of motion. No JVD present.  Cardiovascular: Intact distal pulses. Exam reveals no gallop and no friction rub.  Murmur heard. Irreg, irreg  Respiratory: No respiratory distress. She has no wheezes. She has rales.  GI: Soft. Bowel sounds are normal. She exhibits no distension. There is no tenderness. There is no rebound.  Musculoskeletal: Normal range of motion. She exhibits edema. She exhibits no tenderness.  Neurological: She is alert and oriented to person, place, and time. She has normal reflexes. She displays normal reflexes. No cranial nerve deficit. She exhibits normal muscle tone. Coordination normal.  Skin: Skin is warm and dry. She is not diaphoretic.  Psychiatric: She has a normal mood and affect.   Lab Results:  Basename 04/30/11 0511 04/29/11 2349 04/29/11 0540  WBC 9.3 -- 10.0    HGB 10.0* 11.0* --  HCT 31.9* 34.2* --  PLT 211 -- 224   BMET  Basename 04/29/11 0540 04/28/11 0535  NA 135 138  K 3.6 3.6  CL 97 101  CO2 29 27  GLUCOSE 118* 119*  BUN 22 21  CREATININE 1.14* 1.18*  CALCIUM 8.5 9.0   Blood Culture    Component Value Date/Time   SDES SPUTUM 04/28/2011 2255   SDES SPUTUM 04/28/2011 2255   SPECREQUEST Normal 04/28/2011 2255   SPECREQUEST NONE 04/28/2011 2255   CULT NORMAL OROPHARYNGEAL FLORA 04/28/2011 2255   REPTSTATUS 04/29/2011 FINAL 04/28/2011 2255   REPTSTATUS PENDING 04/28/2011 2255   Assessment/Plan: C.difficile colitis = would treat for a total of 14 days with metronidazole 500mg  QID. If her diarrhea is not improved in 2-3 d, may need to switch to oral vancomycin.  LOS: 3 days   Judyann Munson 04/30/2011, 4:14 PM

## 2011-05-01 ENCOUNTER — Encounter (HOSPITAL_COMMUNITY): Payer: Self-pay | Admitting: Anesthesiology

## 2011-05-01 ENCOUNTER — Encounter (HOSPITAL_COMMUNITY)
Admission: EM | Disposition: A | Discharge status: Home Health | Payer: Self-pay | Source: Home / Self Care | Attending: Cardiology

## 2011-05-01 ENCOUNTER — Inpatient Hospital Stay (HOSPITAL_COMMUNITY): Payer: Medicare Other | Admitting: Anesthesiology

## 2011-05-01 ENCOUNTER — Encounter (HOSPITAL_COMMUNITY): Payer: Self-pay | Admitting: *Deleted

## 2011-05-01 HISTORY — PX: CARDIOVERSION: SHX1299

## 2011-05-01 HISTORY — PX: TEE WITHOUT CARDIOVERSION: SHX5443

## 2011-05-01 LAB — CULTURE, RESPIRATORY W GRAM STAIN: Culture: NORMAL

## 2011-05-01 LAB — BASIC METABOLIC PANEL
BUN: 28 mg/dL — ABNORMAL HIGH (ref 6–23)
CO2: 26 mEq/L (ref 19–32)
Calcium: 9.1 mg/dL (ref 8.4–10.5)
Chloride: 93 mEq/L — ABNORMAL LOW (ref 96–112)
Creatinine, Ser: 1.31 mg/dL — ABNORMAL HIGH (ref 0.50–1.10)
GFR calc Af Amer: 43 mL/min — ABNORMAL LOW (ref >90)
GFR calc non Af Amer: 37 mL/min — ABNORMAL LOW (ref >90)
Glucose, Bld: 131 mg/dL — ABNORMAL HIGH (ref 70–99)
Potassium: 3.8 mEq/L (ref 3.5–5.1)
Sodium: 132 mEq/L — ABNORMAL LOW (ref 135–145)

## 2011-05-01 SURGERY — ECHOCARDIOGRAM, TRANSESOPHAGEAL
Anesthesia: Moderate Sedation

## 2011-05-01 MED ORDER — METOPROLOL TARTRATE 12.5 MG HALF TABLET
12.5000 mg | ORAL_TABLET | Freq: Two times a day (BID) | ORAL | Status: DC
  Administered 2011-05-01 – 2011-05-03 (×5): 12.5 mg via ORAL
  Filled 2011-05-01 (×6): qty 1

## 2011-05-01 MED ORDER — FENTANYL CITRATE 0.05 MG/ML IJ SOLN
INTRAMUSCULAR | Status: DC | PRN
  Administered 2011-05-01 (×2): 25 ug via INTRAVENOUS

## 2011-05-01 MED ORDER — FENTANYL CITRATE 0.05 MG/ML IJ SOLN
INTRAMUSCULAR | Status: AC
  Filled 2011-05-01: qty 2

## 2011-05-01 MED ORDER — SODIUM CHLORIDE 0.9 % IV SOLN: INTRAVENOUS | Status: DC

## 2011-05-01 MED ORDER — MIDAZOLAM HCL 10 MG/2ML IJ SOLN
INTRAMUSCULAR | Status: DC | PRN
  Administered 2011-05-01: 2 mg via INTRAVENOUS
  Administered 2011-05-01 (×2): 1 mg via INTRAVENOUS

## 2011-05-01 MED ORDER — RIVAROXABAN 15 MG PO TABS
15.0000 mg | ORAL_TABLET | Freq: Every day | ORAL | Status: DC
  Filled 2011-05-01: qty 1

## 2011-05-01 MED ORDER — PROPOFOL 10 MG/ML IV EMUL
INTRAVENOUS | Status: DC | PRN
  Administered 2011-05-01: 70 mg via INTRAVENOUS

## 2011-05-01 MED ORDER — SODIUM CHLORIDE 0.9 % IV SOLN
INTRAVENOUS | Status: DC | PRN
  Administered 2011-05-01: 11:00:00 via INTRAVENOUS

## 2011-05-01 MED ORDER — AMIODARONE HCL 200 MG PO TABS
200.0000 mg | ORAL_TABLET | Freq: Two times a day (BID) | ORAL | Status: DC
  Administered 2011-05-01 (×2): 200 mg via ORAL
  Filled 2011-05-01 (×4): qty 1

## 2011-05-01 MED ORDER — DEXTROSE 5 % IV SOLN
60.0000 mg/h | Freq: Once | INTRAVENOUS | Status: DC
  Administered 2011-05-01: 30 mg/h via INTRAVENOUS
  Filled 2011-05-01: qty 9

## 2011-05-01 MED ORDER — BUTAMBEN-TETRACAINE-BENZOCAINE 2-2-14 % EX AERO
INHALATION_SPRAY | CUTANEOUS | Status: DC | PRN
  Administered 2011-05-01: 2 via TOPICAL

## 2011-05-01 MED ORDER — MIDAZOLAM HCL 10 MG/2ML IJ SOLN
INTRAMUSCULAR | Status: AC
  Filled 2011-05-01: qty 2

## 2011-05-01 NOTE — Anesthesia Procedure Notes (Signed)
Date/Time: 05/01/2011 10:38 AM Performed by: Adria Dill Pre-anesthesia Checklist: Patient being monitored, Suction available, Emergency Drugs available and Patient identified Patient Re-evaluated:Patient Re-evaluated prior to inductionPreoxygenation: Pre-oxygenation with 100% oxygen Intubation Type: IV induction Ventilation: Mask ventilation without difficulty Dental Injury: Teeth and Oropharynx as per pre-operative assessment  Comments: Pt mask vent by Darcey Nora CRNA throughout procedure. EZ vent; VSS.

## 2011-05-01 NOTE — OR Nursing (Signed)
Tee Cardioversion note:  Shocked x1 at 150 joules at 10:28 am. Converted to NSR [employer]

## 2011-05-01 NOTE — Preoperative (Signed)
Beta Blockers   Reason not to administer Beta Blockers:Not Applicable 

## 2011-05-01 NOTE — Anesthesia Preprocedure Evaluation (Addendum)
Anesthesia Evaluation  Patient identified by MRN, date of birth, ID band  Reviewed: Allergy & Precautions, H&P , reviewed documented beta blocker date and time   Airway Mallampati: II  Neck ROM: full    Dental   Pulmonary shortness of breath, pneumonia , former smoker         Cardiovascular hypertension, Pt. on medications and Pt. on home beta blockers + angina + CAD and +CHF + Valvular Problems/Murmurs     Neuro/Psych    GI/Hepatic hiatal hernia, GERD-  Medicated,  Endo/Other  Hyperthyroidism   Renal/GU      Musculoskeletal   Abdominal   Peds  Hematology   Anesthesia Other Findings   Reproductive/Obstetrics                          Anesthesia Physical Anesthesia Plan  ASA: III  Anesthesia Plan: MAC   Post-op Pain Management:    Induction: Intravenous  Airway Management Planned:   Additional Equipment:   Intra-op Plan:   Post-operative Plan:   Informed Consent:   Plan Discussed with: CRNA and Anesthesiologist  Anesthesia Plan Comments:         Anesthesia Quick Evaluation

## 2011-05-01 NOTE — Transfer of Care (Signed)
Immediate Anesthesia Transfer of Care Note  Patient: Carla Black  Procedure(s) Performed:  TRANSESOPHAGEAL ECHOCARDIOGRAM (TEE); CARDIOVERSION  Patient Location: PACU  Anesthesia Type: General  Level of Consciousness: awake, alert , oriented, patient cooperative and responds to stimulation  Airway & Oxygen Therapy: Patient Spontanous Breathing, Patient connected to nasal cannula oxygen and swallows, talks, opens eyes  Post-op Assessment: Report given to PACU RN, Post -op Vital signs reviewed and stable, Patient moving all extremities and Patient able to stick tongue midline  Post vital signs: Reviewed and stable  Complications: No apparent anesthesia complications

## 2011-05-01 NOTE — Progress Notes (Signed)
*  PRELIMINARY RESULTS* Echocardiogram Echocardiogram Transesophageal has been performed.  Glean Salen Antonio RDCS 05/01/2011, 11:05 AM

## 2011-05-01 NOTE — Progress Notes (Signed)
[facility]  DAILY PROGRESS NOTE   Subjective:  Still mildly dyspneic. No chest pain.  Objective:  Temp:  [97.6 F (36.4 C)-98.5 F (36.9 C)] 97.9 F (36.6 C) (11/09 0800) Pulse Rate:  [97-125] 116  (11/09 0904) Resp:  [13-24] 21  (11/09 0951) BP: (115-148)/(57-83) 148/72 mmHg (11/09 0945) SpO2:  [94 %-99 %] 97 % (11/09 0951) Weight change:   Intake/Output from previous day: 11/08 0701 - 11/09 0700 In: 615.1 [P.O.:1; I.V.:614.1] Out: 6 [Urine:3; Stool:3]  Intake/Output from this shift:    Medications: Current Facility-Administered Medications  Medication Dose Route Frequency Provider Last Rate Last Dose  . 0.9 %  sodium chloride infusion   Intravenous Continuous Lovelee Forner      . amiodarone (CORDARONE) 450 mg in dextrose 5 % 250 mL infusion  30 mg/hr Intravenous Continuous Judene Companion, MD      . amiodarone (CORDARONE) 450 mg in dextrose 5 % 250 mL infusion  60 mg/hr Intravenous Once Judene Companion, MD      . aspirin chewable tablet 81 mg  81 mg Oral Daily Lora Poteet Seay, PHARMD   81 mg at 04/29/11 1052  . calcium-vitamin D (OSCAL WITH D) 500-200 MG-UNIT per tablet 1 tablet  1 tablet Oral Daily Lora Poteet Seay, PHARMD      . cholecalciferol (VITAMIN D) tablet 400 Units  400 Units Oral Daily Lenore Cordia, NP   400 Units at 04/28/11 1046  . clopidogrel (PLAVIX) tablet 75 mg  75 mg Oral Daily Lenore Cordia, NP   75 mg at 04/29/11 1053  . diltiazem (CARDIZEM) 100 mg in dextrose 5 % 100 mL infusion  15 mg/hr Intravenous Continuous Marykay Lex 15 mL/hr at 05/01/11 0449 15 mg/hr at 05/01/11 0449  . diltiazem (CARDIZEM) 100 MG injection           . diltiazem (CARDIZEM) injection SOLN 20 mg  20 mg Intravenous Once Marykay Lex   20 mg at 04/30/11 1915  . furosemide (LASIX) injection 40 mg  40 mg Intravenous BID Leone Brand, NP   40 mg at 04/30/11 1127  . levalbuterol (XOPENEX) nebulizer solution 0.63 mg  0.63 mg Nebulization  Q6H Lenore Cordia, NP   0.63 mg at 05/01/11 0257  . levothyroxine (SYNTHROID, LEVOTHROID) tablet 100 mcg  100 mcg Oral Daily Lenore Cordia, NP   100 mcg at 04/29/11 1106  . loratadine (CLARITIN) tablet 10 mg  10 mg Oral Daily Lenore Cordia, NP   10 mg at 04/28/11 1043  . magnesium oxide (MAG-OX) tablet 400 mg  400 mg Oral BID Eda Paschal [place], PA   400 mg at 04/30/11 2227  . metoprolol tartrate (LOPRESSOR) 25 mg/10 mL oral suspension 6.25 mg  6.25 mg Oral BID Leone Brand, NP   6.25 mg at 04/30/11 2227  . metroNIDAZOLE (FLAGYL) tablet 500 mg  500 mg Oral Q6H Dwana Melena, PA   500 mg at 05/01/11 0600  . nitroGLYCERIN (NITROSTAT) SL tablet 0.4 mg  0.4 mg Sublingual PRN Lenore Cordia, NP      . omega-3 acid ethyl esters (LOVAZA) capsule 1 g  1 g Oral BID Lora Poteet Seay, PHARMD   1 g at 04/30/11 2227  . pantoprazole (PROTONIX) EC tablet 40 mg  40 mg Oral Daily Eda Paschal [place], [place]   40 mg at 04/30/11 1514  . potassium chloride SA (K-DUR,KLOR-CON) CR tablet 20 mEq  20  mEq Oral Daily Lora Poteet Seay, PHARMD   20 mEq at 04/30/11 1000  . rivaroxaban (XARELTO) tablet 10 mg  10 mg Oral Daily Brier Reid   10 mg at 04/30/11 1514  . DISCONTD: azithromycin (ZITHROMAX) tablet 250 mg  250 mg Oral Daily Lenore Cordia, NP   250 mg at 04/29/11 1106    Physical Exam: General appearance: alert and no distress Neck: no adenopathy, no carotid bruit, no JVD, supple, symmetrical, trachea midline and thyroid not enlarged, symmetric, no tenderness/mass/nodules Lungs: clear to auscultation bilaterally Heart: irregularly irregular rhythm, S1, S2 normal and systolic murmur: early systolic 2/6, crescendo and decrescendo throughout the precordium Abdomen: soft, non-tender; bowel sounds normal; no masses,  no organomegaly Extremities: extremities normal, atraumatic, no cyanosis or edema Skin: Skin color, texture, turgor normal. No rashes or lesions Neurologic: Grossly normal  Lab Results: Results  for orders placed during the hospital encounter of 04/27/11 (from the past 48 hour(s))  HEMOGLOBIN AND HEMATOCRIT, BLOOD     Status: Abnormal   Collection Time   04/29/11 11:49 PM      Component Value Range Comment   Hemoglobin 11.0 (*) 12.0 - 15.0 (g/dL)    HCT 16.1 (*) 09.6 - 46.0 (%)   CBC     Status: Abnormal   Collection Time   04/30/11  5:11 AM      Component Value Range Comment   WBC 9.3  4.0 - 10.5 (K/uL)    RBC 3.39 (*) 3.87 - 5.11 (MIL/uL)    Hemoglobin 10.0 (*) 12.0 - 15.0 (g/dL)    HCT 04.5 (*) 40.9 - 46.0 (%)    MCV 94.1  78.0 - 100.0 (fL)    MCH 29.5  26.0 - 34.0 (pg)    MCHC 31.3  30.0 - 36.0 (g/dL)    RDW 81.1  91.4 - 78.2 (%)    Platelets 211  150 - 400 (K/uL)   MRSA PCR SCREENING     Status: Normal   Collection Time   04/30/11 11:09 AM      Component Value Range Comment   MRSA by PCR NEGATIVE  NEGATIVE    BASIC METABOLIC PANEL     Status: Abnormal   Collection Time   05/01/11  4:05 AM      Component Value Range Comment   Sodium 132 (*) 135 - 145 (mEq/L)    Potassium 3.8  3.5 - 5.1 (mEq/L)    Chloride 93 (*) 96 - 112 (mEq/L)    CO2 26  19 - 32 (mEq/L)    Glucose, Bld 131 (*) 70 - 99 (mg/dL)    BUN 28 (*) 6 - 23 (mg/dL)    Creatinine, Ser 9.56 (*) 0.50 - 1.10 (mg/dL)    Calcium 9.1  8.4 - 10.5 (mg/dL)    GFR calc non Af Amer 37 (*) >90 (mL/min)    GFR calc Af Amer 43 (*) >90 (mL/min)     Imaging: Imaging results have been reviewed  Assessment:  1. Principal Problem: 2.  *Clostridium difficile infection 3.   Plan:  1. Proceed with TEE and cardioversion. Risks and benefits reviewed and she agrees to proceed. Anticoagulation with Xarelto.She has not had any further GI bleeding. May have been colitis related.  Time Spent Directly with Patient:  30 minutes  Length of Stay:  LOS: 4 days    Salam Chesterfield 05/01/2011, 10:07 AM

## 2011-05-01 NOTE — Progress Notes (Signed)
[facility]  TEE/CARDIOVERSION NOTE   TRANSESOPHAGEAL ECHOCARDIOGRAM (TEE):  Indictation: Atrial Fibrillation and Hypertrophic obstructive cardiomyopathy, severe mitral insufficiency, diastolic heart failure.  Consent:   Informed consent was obtained prior to the procedure. The risks, benefits and alternatives for the procedure were discussed and the patient comprehended these risks.  Risks include, but are not limited to, cough, sore throat, vomiting, nausea, somnolence, esophageal and stomach trauma or perforation, bleeding, low blood pressure, aspiration, pneumonia, infection, trauma to the teeth and death.    Time Out: Verified patient identification, verified procedure, site/side was marked, verified correct patient position, special equipment/implants available, medications/allergies/relevent history reviewed, required imaging and test results available. Performed  Procedure:  After a procedural time-out, the patient was given 4 mg versed and 50 mcg fentanyl for moderate sedation.  The oropharynx was anesthetized with benzocaine spray.  The transesophageal probe was inserted in the esophagus and stomach without difficulty and multiple views were obtained.  The patient was kept under observation until the patient left the procedure room.  The patient left the procedure room in stable condition.   Agitated microbubble saline contrast was not administered.  Complications:    Complications: None Patient did tolerate procedure well.  Findings:  1. LEFT VENTRICLE: The left ventricle is normal in structure and function without any thrombus or masses. LVEF is 65%.  2. RIGHT VENTRICLE:  The right ventricle is normal in structure and function without any thrombus or masses.    3. LEFT ATRIUM:  The left atrium is normal without any thrombus or masses.  4. LEFT ATRIAL APPENDAGE:  The left atrial appendage is free of any thrombus or masses. Left atrial size  is moderately dilated.  5. ATRIAL SEPTUM:  The atrial septum is free of any thrombus or masses.  There is no evidence for interatrial shunting by color doppler and saline microbubble.  6. RIGHT ATRIUM:  The right atrium is normal in size and function without any thrombus or masses.    7. MITRAL VALVE:  The mitral valve shows moderate SAM and has a ruptured chord. There is moderate to severe MR. This is partly due to [facility] (worsens following short-long cycle c/w [place] effect), but also with anterior eccentric component due to ruptured posterior leaflet chord.  8. AORTIC VALVE:  The aortic valve is trileaflet, mildly sclerotic and has subvalvular mild obstruction without regurgitation.  There were no vegetations or stenosis   9. TRICUSPID VALVE:  The tricuspid valve is normal in structure and function with moderate regurgitation.  There were no vegetations or stenosis  10.  PULMONIC VALVE:  The tricuspid valve is normal in structure and function without regurgitation.  There were no vegetations or stenosis.   11. AORTIC ARCH, ASCENDING AND DESCENDING AORTA:  There was moderate atherosclerosis of the descending or ascending aorta  CARDIOVERSION:     Time Out: Verified patient identification, verified procedure, site/side was marked, verified correct patient position, special equipment/implants available, medications/allergies/relevent history reviewed, required imaging and test results available.  Performed  Procedure:  1. Patient placed on cardiac monitor, pulse oximetry, supplemental oxygen as necessary.  2. Sedation administered per anesthesia - Dr. Chaney Malling, IV propofol) 3. Pacer pads placed anterior and posterior chest. 4. Cardioverted 1 time.  5. Cardioverted at 150J (synchronized, biphasic). NSR at 70 bpm.  Complications:  Complications: None Patient did tolerate procedure well.  Impression:  1.  Successful Cardioversion 2. Heart failure decompensation due to  new onset atrial fibrillation in setting of  HOCM/diast dysf. And severe MR  Recommendations:  1.  Anticoagulation with Xarelto. Monitor hemoglobin. Physical therapy. Gradually increase beta blocker dose.  Time Spent Directly with the Patient:  60 minutes   Thurmon Fair, MD, [facility] Attending Cardiologist [facility] 05/01/2011, 10:28 AM

## 2011-05-01 NOTE — Anesthesia Postprocedure Evaluation (Signed)
  Anesthesia Post-op Note  Patient: Carla Black  Procedure(s) Performed:  TRANSESOPHAGEAL ECHOCARDIOGRAM (TEE); CARDIOVERSION  Patient Location: PACU  Anesthesia Type: General  Level of Consciousness: awake, alert , oriented, patient cooperative and responds to stimulation  Airway and Oxygen Therapy: Patient Spontanous Breathing and Patient connected to nasal cannula oxygen  Post-op Pain: none  Post-op Assessment: Post-op Vital signs reviewed, Patient's Cardiovascular Status Stable, RESPIRATORY FUNCTION UNSTABLE, Patent Airway and No signs of Nausea or vomiting  Post-op Vital Signs: Reviewed and stable  Complications: No apparent anesthesia complications

## 2011-05-02 ENCOUNTER — Inpatient Hospital Stay (HOSPITAL_COMMUNITY): Payer: Medicare Other

## 2011-05-02 DIAGNOSIS — I5030 Unspecified diastolic (congestive) heart failure: Secondary | ICD-10-CM | POA: Diagnosis present

## 2011-05-02 DIAGNOSIS — I340 Nonrheumatic mitral (valve) insufficiency: Secondary | ICD-10-CM | POA: Diagnosis present

## 2011-05-02 DIAGNOSIS — I421 Obstructive hypertrophic cardiomyopathy: Secondary | ICD-10-CM | POA: Diagnosis present

## 2011-05-02 DIAGNOSIS — I4891 Unspecified atrial fibrillation: Secondary | ICD-10-CM | POA: Diagnosis present

## 2011-05-02 DIAGNOSIS — I5033 Acute on chronic diastolic (congestive) heart failure: Secondary | ICD-10-CM | POA: Diagnosis present

## 2011-05-02 MED ORDER — WARFARIN VIDEO
Freq: Once | Status: AC
  Administered 2011-05-02: 18:00:00
  Filled 2011-05-02: qty 1

## 2011-05-02 MED ORDER — SODIUM CHLORIDE 0.9 % IJ SOLN
10.0000 mL | INTRAMUSCULAR | Status: DC | PRN
  Filled 2011-05-02: qty 20
  Filled 2011-05-02: qty 10
  Filled 2011-05-02: qty 20
  Filled 2011-05-02 (×2): qty 10

## 2011-05-02 MED ORDER — HEPARIN BOLUS VIA INFUSION
3000.0000 [IU] | Freq: Once | INTRAVENOUS | Status: AC
  Administered 2011-05-02: 3000 [IU] via INTRAVENOUS

## 2011-05-02 MED ORDER — AMIODARONE HCL 200 MG PO TABS
400.0000 mg | ORAL_TABLET | Freq: Two times a day (BID) | ORAL | Status: DC
  Filled 2011-05-02: qty 2

## 2011-05-02 MED ORDER — SODIUM CHLORIDE 0.9 % IJ SOLN
10.0000 mL | Freq: Two times a day (BID) | INTRAMUSCULAR | Status: DC
  Administered 2011-05-02 – 2011-05-09 (×15): 10 mL
  Filled 2011-05-02: qty 10
  Filled 2011-05-02: qty 30
  Filled 2011-05-02 (×4): qty 20
  Filled 2011-05-02 (×2): qty 30

## 2011-05-02 MED ORDER — DEXTROSE 5 % IV SOLN
30.0000 mg/h | INTRAVENOUS | Status: DC
  Administered 2011-05-02 – 2011-05-03 (×2): 30 mg/h via INTRAVENOUS
  Filled 2011-05-02 (×4): qty 9

## 2011-05-02 MED ORDER — CEPHALEXIN 500 MG PO CAPS
500.0000 mg | ORAL_CAPSULE | Freq: Three times a day (TID) | ORAL | Status: DC
  Administered 2011-05-02 – 2011-05-05 (×9): 500 mg via ORAL
  Filled 2011-05-02 (×12): qty 1

## 2011-05-02 MED ORDER — DEXTROSE 5 % IV SOLN
150.0000 mg | Freq: Once | INTRAVENOUS | Status: AC
  Administered 2011-05-02: 150 mg via INTRAVENOUS
  Filled 2011-05-02: qty 3

## 2011-05-02 MED ORDER — HEPARIN BOLUS VIA INFUSION
3000.0000 [IU] | INTRAVENOUS | Status: DC
  Filled 2011-05-02: qty 3000

## 2011-05-02 MED ORDER — HEPARIN (PORCINE) IN NACL 100-0.45 UNIT/ML-% IJ SOLN
1350.0000 [IU]/h | INTRAMUSCULAR | Status: DC
  Administered 2011-05-02 – 2011-05-03 (×2): 1200 [IU]/h via INTRAVENOUS
  Administered 2011-05-05 – 2011-05-08 (×5): 1350 [IU]/h via INTRAVENOUS
  Filled 2011-05-02 (×14): qty 250

## 2011-05-02 MED ORDER — HEPARIN (PORCINE) IN NACL 100-0.45 UNIT/ML-% IJ SOLN: 1200.0000 [IU]/h | INTRAMUSCULAR | Status: DC

## 2011-05-02 MED ORDER — COUMADIN BOOK
Freq: Once | Status: AC
  Administered 2011-05-02: 18:00:00
  Filled 2011-05-02: qty 1

## 2011-05-02 MED ORDER — WARFARIN SODIUM 2.5 MG PO TABS
2.5000 mg | ORAL_TABLET | Freq: Once | ORAL | Status: AC
  Administered 2011-05-02: 2.5 mg via ORAL
  Filled 2011-05-02: qty 1

## 2011-05-02 MED ORDER — AMIODARONE HCL 200 MG PO TABS: 400.0000 mg | ORAL_TABLET | Freq: Every day | ORAL | Status: DC

## 2011-05-02 MED ORDER — DEXTROSE 5 % IV SOLN
60.0000 mg/h | INTRAVENOUS | Status: AC
  Administered 2011-05-02 (×2): 60 mg/h via INTRAVENOUS
  Filled 2011-05-02: qty 9

## 2011-05-02 MED ORDER — LEVALBUTEROL HCL 0.63 MG/3ML IN NEBU
0.6300 mg | INHALATION_SOLUTION | Freq: Four times a day (QID) | RESPIRATORY_TRACT | Status: DC | PRN
  Filled 2011-05-02: qty 3

## 2011-05-02 NOTE — Progress Notes (Signed)
ANTICOAGULATION CONSULT NOTE - Initial Consult  Pharmacy Consult for Heparin and Coumadin, while on amiodarone and metronidazole Indication: AFib with RVR s/p TEE cardioversion on 11/9 AM  Allergies  Allergen Reactions  . Metoprolol Other (See Comments)    Bradycardia  . Codeine Nausea Only    And dizziness  . Seldane (Terfenadine) Nausea Only    And dizziness  . Statins     myalgia    Patient Measurements: Height: 5' 3.5" (161.3 cm) Weight: 189 lb 13.1 oz (86.1 kg) IBW/kg (Calculated) : 53.55   Vital Signs: Temp: 98.6 F (37 C) (11/10 1200) Temp src: Oral (11/10 1200) BP: 117/65 mmHg (11/10 1200) Pulse Rate: 130  (11/10 1200)  Labs:  Basename 05/01/11 0405 04/30/11 0511 04/29/11 2349  HGB -- 10.0* 11.0*  HCT -- 31.9* 34.2*  PLT -- 211 --  APTT -- -- --  LABPROT -- -- --  INR -- -- --  HEPARINUNFRC -- -- --  CREATININE 1.31* -- --  CKTOTAL -- -- --  CKMB -- -- --  TROPONINI -- -- --   Estimated Creatinine Clearance: 35.4 ml/min (by C-G formula based on Cr of 1.31).  Medical History: Past Medical History  Diagnosis Date  . Hypertension   . Hyperlipidemia   . Heart murmur   . GERD (gastroesophageal reflux disease)   . Enlarged heart   . Arthritis   . Joint pain     left knee  . Dizziness   . Chest tightness 02/09/2008  . Hiatal hernia   . CHF (congestive heart failure)   . Carotid artery occlusion   . Shortness of breath   . Angina   . Hyperthyroidism   . Cancer   . Coronary artery disease   . Pneumonia   . Hypertrophic cardiomyopathy     subvalvular gradient 45-mm    Medications:  Scheduled:    . amiodarone (CORDARONE) bolus  150 mg Intravenous Once  . amiodarone  400 mg Oral Q12H   Followed by  . amiodarone  400 mg Oral Daily  . aspirin  81 mg Oral Daily  . calcium-vitamin D  1 tablet Oral Daily  . cephALEXin  500 mg Oral Q8H  . cholecalciferol  400 Units Oral Daily  . coumadin book   Does not apply Once  . furosemide  40 mg  Intravenous BID  . heparin  3,000 Units Intravenous Once  . levothyroxine  100 mcg Oral Daily  . loratadine  10 mg Oral Daily  . magnesium oxide  400 mg Oral BID  . metoprolol tartrate  12.5 mg Oral BID  . metroNIDAZOLE  500 mg Oral Q6H  . omega-3 acid ethyl esters  1 g Oral BID  . pantoprazole  40 mg Oral Daily  . potassium chloride  20 mEq Oral Daily  . sodium chloride  10 mL Intracatheter Q12H  . warfarin  2.5 mg Oral ONCE-1800  . warfarin   Does not apply Once  . DISCONTD: amiodarone  200 mg Oral BID  . DISCONTD: heparin  3,000 Units Intravenous NOW  . DISCONTD: levalbuterol  0.63 mg Nebulization Q6H  . DISCONTD: rivaroxaban  15 mg Oral Daily   Infusions:    . amiodarone (CORDARONE) infusion     Followed by  . amiodarone (CORDARONE) infusion    . heparin    . DISCONTD: heparin      Assessment:  77yo F with AFib, hypertrophic obstructive cardiomyopathy, mitral insufficiency, and diastolic HF. Pharmacy consulted for Heparin and  Coumadin, while on Amiodarone and Metronidazole for Afib with RVR s/p TEE cardioversion (11/9).  Metronidazole and amiodarone therapy may both increase serum levels of coumadin. Though, effects of amiodarone typically take longer to show than metronidazole (at least one week). Lower doses of coumadin may be needed, considering pt's interacting meds, elderly age, and gender.  Goal of Therapy:  INR 2-3 Heparin level 0.3-0.7 units/ml   Plan:  1. Heparin 3000 unit bolus x1 2. Heparin 1200 unit/hr infusion 3. Heparin level - draw 8 hours after start of infusion 4. Coumadin 2.5mg  x1 tonight @6pm  5. Daily Heparin level, CBC, PT/INR 5. Coumadin book and video  Carla Black 05/02/2011,12:58 PM

## 2011-05-02 NOTE — Progress Notes (Signed)
MD Notified of pt condition on amiodarone drip; new orders received to DC PO amiodarone for 2330 administration; drip continued at 30 mg/hr; will continue to monitor

## 2011-05-02 NOTE — Progress Notes (Signed)
ANTICOAGULATION CONSULT NOTE - Follow Up Consult  Pharmacy Consult for Heparin Indication: atrial fibrillation  Medications:  Heparin 1200 units/hr  Assessment: 83 yof on heparin for Afib. Heparin level (0.35) is therapeutic.   Goal of Therapy:  Heparin level 0.3-0.7 units/ml   Plan:  1. Continue heparin 1200 units/hr (12 ml/hr) 2. Follow-up AM heparin level  Cleon Dew 05/02/2011,9:52 PM

## 2011-05-02 NOTE — Progress Notes (Signed)
[facility]  DAILY PROGRESS NOTE   Subjective:  Coughing began at 10 PM last night, same time that rhythm converted back to AF with RVR 130-150 bpm. No angina or dizziness. Tolerated beta blocker - amio PO combination without bradycardia prior to return to AF (HR in 70s).  Currently has no IV access. Appears to have possible thrombophlebitis left arm and wrist.  Objective:  Temp:  [97.7 F (36.5 C)-98.7 F (37.1 C)] 97.7 F (36.5 C) (11/10 0824) Pulse Rate:  [74-133] 124  (11/10 0824) Resp:  [16-23] 22  (11/10 0824) BP: (110-154)/(47-99) 137/76 mmHg (11/10 0824) SpO2:  [93 %-100 %] 98 % (11/10 0824) Weight:  [86.1 kg (189 lb 13.1 oz)] 189 lb 13.1 oz (86.1 kg) (11/10 0500) Weight change:   Intake/Output from previous day: 11/09 0701 - 11/10 0700 In: 330.2 [P.O.:120; I.V.:210.2] Out: 800 [Urine:800]  Intake/Output from this shift: Total I/O In: -  Out: 3 [Urine:2; Stool:1]  Medications: Current Facility-Administered Medications  Medication Dose Route Frequency Provider Last Rate Last Dose  . amiodarone (PACERONE) tablet 200 mg  200 mg Oral BID Jacalyn Biggs   200 mg at 05/01/11 2145  . aspirin chewable tablet 81 mg  81 mg Oral Daily Lora Poteet Seay, PHARMD   81 mg at 05/02/11 1102  . calcium-vitamin D (OSCAL WITH D) 500-200 MG-UNIT per tablet 1 tablet  1 tablet Oral Daily Lora Poteet Seay, PHARMD   1 tablet at 05/02/11 1102  . cephALEXin (KEFLEX) capsule 500 mg  500 mg Oral Q8H Rosevelt Luu      . cholecalciferol (VITAMIN D) tablet 400 Units  400 Units Oral Daily Lenore Cordia, NP   400 Units at 05/02/11 1102  . furosemide (LASIX) injection 40 mg  40 mg Intravenous BID Leone Brand, NP   40 mg at 05/02/11 1610  . levalbuterol (XOPENEX) nebulizer solution 0.63 mg  0.63 mg Nebulization Q6H Lenore Cordia, NP   0.63 mg at 05/02/11 0407  . levothyroxine (SYNTHROID, LEVOTHROID) tablet 100 mcg  100 mcg Oral Daily Lenore Cordia, NP   100  mcg at 05/02/11 1103  . loratadine (CLARITIN) tablet 10 mg  10 mg Oral Daily Lenore Cordia, NP   10 mg at 05/02/11 1103  . magnesium oxide (MAG-OX) tablet 400 mg  400 mg Oral BID Eda Paschal [place], [place]   400 mg at 05/01/11 2145  . metoprolol tartrate (LOPRESSOR) tablet 12.5 mg  12.5 mg Oral BID Leondre Taul   12.5 mg at 05/02/11 1103  . metroNIDAZOLE (FLAGYL) tablet 500 mg  500 mg Oral Q6H Dwana Melena, PA   500 mg at 05/02/11 9604  . nitroGLYCERIN (NITROSTAT) SL tablet 0.4 mg  0.4 mg Sublingual PRN Lenore Cordia, NP      . omega-3 acid ethyl esters (LOVAZA) capsule 1 g  1 g Oral BID Lora Poteet Seay, PHARMD   1 g at 05/02/11 1103  . pantoprazole (PROTONIX) EC tablet 40 mg  40 mg Oral Daily Eda Paschal [place], [place]   40 mg at 05/02/11 1104  . potassium chloride SA (K-DUR,KLOR-CON) CR tablet 20 mEq  20 mEq Oral Daily Lora Poteet Seay, PHARMD   20 mEq at 05/02/11 1101  . Rivaroxaban (XARELTO) tablet TABS 15 mg  15 mg Oral Daily Cathi Hazan   15 mg at 05/02/11 1104  . DISCONTD: 0.9 %  sodium chloride infusion   Intravenous Continuous Loghan Subia      .  DISCONTD: clopidogrel (PLAVIX) tablet 75 mg  75 mg Oral Daily Lenore Cordia, NP   75 mg at 04/29/11 1053  . DISCONTD: diltiazem (CARDIZEM) 100 mg in dextrose 5 % 100 mL infusion  15 mg/hr Intravenous Continuous Marykay Lex   10 mg/hr at 05/01/11 0800  . DISCONTD: metoprolol tartrate (LOPRESSOR) 25 mg/10 mL oral suspension 6.25 mg  6.25 mg Oral BID Leone Brand, NP   6.25 mg at 04/30/11 2227  . DISCONTD: rivaroxaban (XARELTO) tablet 10 mg  10 mg Oral Daily Derius Ghosh   10 mg at 04/30/11 1514    Physical Exam: General appearance: alert and no distress Neck: no adenopathy, no carotid bruit, no JVD, supple, symmetrical, trachea midline and thyroid not enlarged, symmetric, no tenderness/mass/nodules Lungs: clear to auscultation bilaterally Heart: irregularly irregular rhythm, S1, S2 normal and systolic murmur: holosystolic 3/6, blowing  throughout the precordium Abdomen: soft, non-tender; bowel sounds normal; no masses,  no organomegaly Extremities: extremities normal, atraumatic, no cyanosis or edema and red raised plaques in left antecubital and wrist area consistent with nonpurulent superficial thrombophlebitis Pulses: 2+ and symmetric Skin: Skin color, texture, turgor normal. No rashes or lesions or see extremities Neurologic: Alert and oriented X 3, normal strength and tone. Normal symmetric reflexes. Normal coordination and gait  Lab Results: Results for orders placed during the hospital encounter of 04/27/11 (from the past 48 hour(s))  BASIC METABOLIC PANEL     Status: Abnormal   Collection Time   05/01/11  4:05 AM      Component Value Range Comment   Sodium 132 (*) 135 - 145 (mEq/L)    Potassium 3.8  3.5 - 5.1 (mEq/L)    Chloride 93 (*) 96 - 112 (mEq/L)    CO2 26  19 - 32 (mEq/L)    Glucose, Bld 131 (*) 70 - 99 (mg/dL)    BUN 28 (*) 6 - 23 (mg/dL)    Creatinine, Ser 7.82 (*) 0.50 - 1.10 (mg/dL)    Calcium 9.1  8.4 - 10.5 (mg/dL)    GFR calc non Af Amer 37 (*) >90 (mL/min)    GFR calc Af Amer 43 (*) >90 (mL/min)     Imaging: No results found.  Assessment:  1. Principal Problem: 2.  *Atrial fibrillation with rapid ventricular response 3. Active Problems: 4.  Hypertrophic obstructive cardiomyopathy with diastolic heart failure 5.  Acute on chronic diastolic heart failure 6.  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  Clostridium difficile infection 8.   Plan:  1. Resume iv diltiazem once IV access is established. Increase beta blocker further. Reload iv amiodarone. She will be here for a few days and we will reattempt cardioversion once amio has "built-up". No need for TEE again as anticoagulated since last TEE. 2. PICC line for iv access and lab draws. 3. Antibiotics for possible superficial thrombophlebitis 4. Warfarin education 5. Recheck for C. diff to dc isolation  Time  Spent Directly with Patient:  30 minutes  Length of Stay:  LOS: 5 days    Carla Black 05/02/2011, 11:24 AM

## 2011-05-03 LAB — CBC
MCH: 29.7 pg (ref 26.0–34.0)
MCHC: 31.5 g/dL (ref 30.0–36.0)
MCV: 94.2 fL (ref 78.0–100.0)
Platelets: 293 10*3/uL (ref 150–400)
RDW: 14.8 % (ref 11.5–15.5)

## 2011-05-03 LAB — PROTIME-INR
INR: 1.2 (ref 0.00–1.49)
Prothrombin Time: 15.5 seconds — ABNORMAL HIGH (ref 11.6–15.2)

## 2011-05-03 LAB — HEPARIN LEVEL (UNFRACTIONATED): Heparin Unfractionated: 0.52 IU/mL (ref 0.30–0.70)

## 2011-05-03 MED ORDER — DEXTROSE 5 % IV SOLN
10.0000 mg/h | INTRAVENOUS | Status: AC
  Administered 2011-05-03: 15 mg/h via INTRAVENOUS
  Administered 2011-05-03: 5 mg/h via INTRAVENOUS
  Filled 2011-05-03 (×2): qty 100

## 2011-05-03 MED ORDER — METOPROLOL TARTRATE 25 MG PO TABS
25.0000 mg | ORAL_TABLET | Freq: Two times a day (BID) | ORAL | Status: DC
  Administered 2011-05-04 – 2011-05-06 (×6): 25 mg via ORAL
  Filled 2011-05-03 (×8): qty 1

## 2011-05-03 MED ORDER — DILTIAZEM LOAD VIA INFUSION
20.0000 mg | Freq: Once | INTRAVENOUS | Status: AC
  Administered 2011-05-03: 20 mg via INTRAVENOUS
  Filled 2011-05-03: qty 20

## 2011-05-03 MED ORDER — WARFARIN SODIUM 2.5 MG PO TABS
2.5000 mg | ORAL_TABLET | Freq: Once | ORAL | Status: AC
  Administered 2011-05-03: 2.5 mg via ORAL
  Filled 2011-05-03 (×2): qty 1

## 2011-05-03 NOTE — Progress Notes (Signed)
ANTICOAGULATION CONSULT NOTE - Follow Up Consult  Pharmacy Consult for Heparin and Coumadin Indication: atrial fibrillation with RVR s/p TEE, on amiodarone and metronidazole  Allergies  Allergen Reactions  . Metoprolol Other (See Comments)    Bradycardia  . Codeine Nausea Only    And dizziness  . Seldane (Terfenadine) Nausea Only    And dizziness  . Statins     myalgia    Patient Measurements: Height: 5' 3.5" (161.3 cm) Weight: 186 lb 11.7 oz (84.7 kg) IBW/kg (Calculated) : 53.55   Vital Signs: Temp: 98 F (36.7 C) (11/11 1127) Temp src: Oral (11/11 1127) BP: 130/80 mmHg (11/11 1127) Pulse Rate: 122  (11/11 1127)  Labs:  Basename 05/03/11 0540 05/02/11 2039 05/01/11 0405  HGB 11.2* -- --  HCT 35.5* -- --  PLT 293 -- --  APTT -- -- --  LABPROT 15.5* -- --  INR 1.20 -- --  HEPARINUNFRC 0.52 0.35 --  CREATININE -- -- 1.31*  CKTOTAL -- -- --  CKMB -- -- --  TROPONINI -- -- --   Estimated Creatinine Clearance: 35.1 ml/min (by C-G formula based on Cr of 1.31).   Medications:  Scheduled:    . amiodarone (CORDARONE) bolus  150 mg Intravenous Once  . aspirin  81 mg Oral Daily  . calcium-vitamin D  1 tablet Oral Daily  . cephALEXin  500 mg Oral Q8H  . cholecalciferol  400 Units Oral Daily  . coumadin book   Does not apply Once  . diltiazem  20 mg Intravenous Once  . furosemide  40 mg Intravenous BID  . heparin  3,000 Units Intravenous Once  . levothyroxine  100 mcg Oral Daily  . loratadine  10 mg Oral Daily  . metoprolol tartrate  25 mg Oral BID  . metroNIDAZOLE  500 mg Oral Q6H  . omega-3 acid ethyl esters  1 g Oral BID  . pantoprazole  40 mg Oral Daily  . potassium chloride  20 mEq Oral Daily  . sodium chloride  10 mL Intracatheter Q12H  . warfarin  2.5 mg Oral ONCE-1800  . warfarin   Does not apply Once  . DISCONTD: amiodarone  400 mg Oral Q12H  . DISCONTD: amiodarone  400 mg Oral Daily  . DISCONTD: metoprolol tartrate  12.5 mg Oral BID   Infusions:      . amiodarone (CORDARONE) infusion 60 mg/hr (05/02/11 1600)   Followed by  . amiodarone (CORDARONE) infusion 30 mg/hr (05/02/11 2035)  . diltiazem (CARDIZEM) infusion 5 mg/hr (05/03/11 1302)  . heparin 1,200 Units/hr (05/03/11 4098)    Assessment: 77yo F with AFib, hypertrophic obstructive cardiomyopathy, mitral insufficiency, and diastolic HF. Pharmacy consulted for Heparin and Coumadin, while on Amiodarone and Metronidazole for Afib with RVR s/p TEE cardioversion (11/9). 1-on-1 coumadin education done today.  -INR 1.2, as expected with first dose of coumadin last night. Goal INR 2-3 -Heparin level 0.52 (goal 0.3-0.7), at goal -no bleeding per pt, CBC stable.  Plan:  1. Continue Heparin 1200 units/hr IV infusion 2. Coumadin 2.5mg  PO x1 @1800  3. F/u AM INR and heparin level 4. Monitor CBC, S/Sx bleeding   Wyline Copas 05/03/2011,1:05 PM

## 2011-05-03 NOTE — Progress Notes (Signed)
[facility]  DAILY PROGRESS NOTE   Subjective:  Upset that the arrhythmia is still present but she still is not feeling well and requires numerous blood draws. Continues to have cough with recumbency. No dyspnea during a bath today no chest pain dizziness or syncope.  Manes in atrial fibrillation with very rapid ventricular response. The slowest ventricular rate recorded was 110 beats per minute or so. Usually in the 130s. There is a big discrepancy between her ins and outs and weights. The weight suggests that she has actually lost 1.4 kg were as ins and outs suggest that she has gained 1.5 L. I suspect that this is due to undercollection of urine specimens  Objective:  Temp:  [97.6 F (36.4 C)-98.6 F (37 C)] 98 F (36.7 C) (11/11 1127) Pulse Rate:  [84-139] 122  (11/11 1127) Resp:  [16-31] 19  (11/11 1127) BP: (97-164)/(57-127) 130/80 mmHg (11/11 1127) SpO2:  [93 %-99 %] 93 % (11/11 1127) Weight:  [84.7 kg (186 lb 11.7 oz)] 186 lb 11.7 oz (84.7 kg) (11/11 0330) Weight change: -1.4 kg (-3 lb 1.4 oz)  Intake/Output from previous day: 11/10 0701 - 11/11 0700 In: 2138.1 [P.O.:600; I.V.:1538.1] Out: 574 [Urine:573; Stool:1]  Intake/Output from this shift: Total I/O In: -  Out: 2 [Urine:1; Stool:1]  Medications: Current Facility-Administered Medications  Medication Dose Route Frequency Provider Last Rate Last Dose  . amiodarone (CORDARONE) 150 mg in dextrose 5 % 100 mL bolus  150 mg Intravenous Once Adia Crammer   150 mg at 05/02/11 1357  . amiodarone (CORDARONE) 450 mg in dextrose 5 % 250 mL infusion  60 mg/hr Intravenous Continuous Lilana Blasko 33.3 mL/hr at 05/02/11 1600 60 mg/hr at 05/02/11 1600   Followed by  . amiodarone (CORDARONE) 450 mg in dextrose 5 % 250 mL infusion  30 mg/hr Intravenous Continuous Judene Companion, MD 16.7 mL/hr at 05/02/11 2035 30 mg/hr at 05/02/11 2035  . aspirin chewable tablet 81 mg  81 mg Oral Daily Lora Poteet  Seay, PHARMD   81 mg at 05/03/11 0955  . calcium-vitamin D (OSCAL WITH D) 500-200 MG-UNIT per tablet 1 tablet  1 tablet Oral Daily Lora Poteet Seay, PHARMD      . cephALEXin (KEFLEX) capsule 500 mg  500 mg Oral Q8H Elfrida Pixley   500 mg at 05/03/11 0548  . cholecalciferol (VITAMIN D) tablet 400 Units  400 Units Oral Daily Lenore Cordia, NP   400 Units at 04/28/11 1046  . coumadin book   Does not apply Once Wyline Copas, PHARMD      . diltiazem (CARDIZEM) 1 mg/mL load via infusion 20 mg  20 mg Intravenous Once Mikhai Bienvenue      . diltiazem (CARDIZEM) 100 mg in dextrose 5 % 100 mL infusion  5-15 mg/hr Intravenous Continuous Damiya Sandefur      . furosemide (LASIX) injection 40 mg  40 mg Intravenous BID Leone Brand, NP   40 mg at 05/03/11 0830  . heparin 100 units/mL bolus via infusion 3,000 Units  3,000 Units Intravenous Once Wyline Copas, PHARMD   3,000 Units at 05/02/11 1430  . heparin ADULT infusion 100 units/mL (25000 units/250 mL)  1,200 Units/hr Intravenous Continuous Wyline Copas, PHARMD 12 mL/hr at 05/03/11 0953 1,200 Units/hr at 05/03/11 0953  . levalbuterol (XOPENEX) nebulizer solution 0.63 mg  0.63 mg Nebulization Q6H PRN Granville Lions, RRT      . levothyroxine (SYNTHROID, LEVOTHROID) tablet 100  mcg  100 mcg Oral Daily Lenore Cordia, NP   100 mcg at 05/03/11 0955  . loratadine (CLARITIN) tablet 10 mg  10 mg Oral Daily Lenore Cordia, NP   10 mg at 04/28/11 1043  . metoprolol tartrate (LOPRESSOR) tablet 25 mg  25 mg Oral BID Hanford Lust      . metroNIDAZOLE (FLAGYL) tablet 500 mg  500 mg Oral Q6H Dwana Melena, PA   500 mg at 05/03/11 0548  . nitroGLYCERIN (NITROSTAT) SL tablet 0.4 mg  0.4 mg Sublingual PRN Lenore Cordia, NP      . omega-3 acid ethyl esters (LOVAZA) capsule 1 g  1 g Oral BID Lora Poteet Seay, PHARMD   1 g at 05/03/11 0954  . pantoprazole (PROTONIX) EC tablet 40 mg  40 mg Oral Daily Eda Paschal [place], [place]   40 mg at 05/03/11 0954  . potassium chloride  SA (K-DUR,KLOR-CON) CR tablet 20 mEq  20 mEq Oral Daily Lora Poteet Seay, PHARMD   20 mEq at 05/03/11 0954  . sodium chloride 0.9 % injection 10 mL  10 mL Intracatheter Q12H Judene Companion, MD   10 mL at 05/03/11 1002  . sodium chloride 0.9 % injection 10 mL  10 mL Intracatheter PRN Judene Companion, MD      . warfarin (COUMADIN) tablet 2.5 mg  2.5 mg Oral ONCE-1800 Wyline Copas, PHARMD   2.5 mg at 05/02/11 1827  . warfarin (COUMADIN) video   Does not apply Once Wyline Copas, PHARMD      . DISCONTD: amiodarone (PACERONE) tablet 400 mg  400 mg Oral Q12H Cynithia Hakimi      . DISCONTD: amiodarone (PACERONE) tablet 400 mg  400 mg Oral Daily Heidemarie Goodnow      . DISCONTD: heparin 100 units/mL bolus via infusion 3,000 Units  3,000 Units Intravenous NOW Wyline Copas, PHARMD      . DISCONTD: heparin ADULT infusion 100 units/mL (25000 units/250 mL)  1,200 Units/hr Intravenous Continuous Wyline Copas, PHARMD      . DISCONTD: levalbuterol Pauline Aus) nebulizer solution 0.63 mg  0.63 mg Nebulization Q6H Lenore Cordia, NP   0.63 mg at 05/02/11 0407  . DISCONTD: metoprolol tartrate (LOPRESSOR) tablet 12.5 mg  12.5 mg Oral BID Joban Colledge   12.5 mg at 05/03/11 1003  . DISCONTD: Rivaroxaban (XARELTO) tablet TABS 15 mg  15 mg Oral Daily Malaina Mortellaro        Physical Exam: General appearance: alert and no distress Neck: JVD - 6 cm above sternal notch, no adenopathy, no carotid bruit, supple, symmetrical, trachea midline and thyroid not enlarged, symmetric, no tenderness/mass/nodules Lungs: clear to auscultation bilaterally Heart: irregularly irregular rhythm, S1, S2 normal and systolic murmur: holosystolic 2/6, blowing throughout the precordium Abdomen: soft, non-tender; bowel sounds normal; no masses,  no organomegaly Extremities: extremities normal, atraumatic, no cyanosis or edema Pulses: 2+ and symmetric Skin: Skin color, texture, turgor normal. No rashes or lesions Neurologic: Alert and oriented X 3,  normal strength and tone. Normal symmetric reflexes. Normal coordination and gait  Lab Results: Results for orders placed during the hospital encounter of 04/27/11 (from the past 48 hour(s))  HEPARIN LEVEL     Status: Normal   Collection Time   05/02/11  8:39 PM      Component Value Range Comment   Heparin Unfractionated 0.35  0.30 - 0.70 (IU/mL)   PROTIME-INR     Status: Abnormal   Collection Time  05/03/11  5:40 AM      Component Value Range Comment   Prothrombin Time 15.5 (*) 11.6 - 15.2 (seconds)    INR 1.20  0.00 - 1.49    CBC     Status: Abnormal   Collection Time   05/03/11  5:40 AM      Component Value Range Comment   WBC 8.2  4.0 - 10.5 (K/uL)    RBC 3.77 (*) 3.87 - 5.11 (MIL/uL)    Hemoglobin 11.2 (*) 12.0 - 15.0 (g/dL)    HCT 96.0 (*) 45.4 - 46.0 (%)    MCV 94.2  78.0 - 100.0 (fL)    MCH 29.7  26.0 - 34.0 (pg)    MCHC 31.5  30.0 - 36.0 (g/dL)    RDW 09.8  11.9 - 14.7 (%)    Platelets 293  150 - 400 (K/uL)   HEPARIN LEVEL     Status: Normal   Collection Time   05/03/11  5:40 AM      Component Value Range Comment   Heparin Unfractionated 0.52  0.30 - 0.70 (IU/mL)     Imaging:no new  Assessment:  1. Principal Problem: 2.  *Atrial fibrillation with rapid ventricular response 3. Active Problems: 4.  Hypertrophic obstructive cardiomyopathy with diastolic heart failure 5.  Acute on chronic diastolic heart failure 6.  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  Clostridium difficile infection 8.   Plan:  1. Requires better rate control. We had planned  reinitiation of intravenous diltiazem but this did not occur. We'll start diltiazem IV today. Would also increase dose of beta blockers. She had some borderline hypotension yesterday but for the most part her blood pressure has been normal to high. Recommend continuing amiodarone as long as possible until we perform another cardioversion. We have to wait for INR to reach therapeutic levels  before discharge anyway. Tentatively plan to perform a repeat cardioversion on Wednesday. Discharge her INR equal to or greater than 2.   I offered to switch from intravenous heparin to subcutaneous enoxaparin. This would alleviate the need for lab draws. She does not want to have the subcutaneous tissue injections.  In the long run if rhythm control strategies with oral amiodarone and repeat cardioversion are unsuccessful he may need to consider more aggressive means. Options include atrial fibrillation ablation or AV node ablation and placement of a ventricular pacemaker. The latter optionmay offer the fringe benefits of reduced dynamic outflow tract  obstruction.  Time Spent Directly with Patient:  30 minutes  Length of Stay:  LOS: 6 days    Navina Wohlers 05/03/2011, 11:37 AM

## 2011-05-04 LAB — CBC
HCT: 33.6 % — ABNORMAL LOW (ref 36.0–46.0)
MCH: 30 pg (ref 26.0–34.0)
MCV: 92.6 fL (ref 78.0–100.0)
Platelets: 286 10*3/uL (ref 150–400)
RDW: 14.6 % (ref 11.5–15.5)
WBC: 7 10*3/uL (ref 4.0–10.5)

## 2011-05-04 LAB — PROTIME-INR
INR: 1.33 (ref 0.00–1.49)
Prothrombin Time: 16.7 seconds — ABNORMAL HIGH (ref 11.6–15.2)

## 2011-05-04 MED ORDER — AMIODARONE HCL 200 MG PO TABS
400.0000 mg | ORAL_TABLET | Freq: Every day | ORAL | Status: DC
  Administered 2011-05-04 – 2011-05-09 (×6): 400 mg via ORAL
  Filled 2011-05-04 (×6): qty 2

## 2011-05-04 MED ORDER — WARFARIN SODIUM 2.5 MG PO TABS
2.5000 mg | ORAL_TABLET | Freq: Once | ORAL | Status: AC
  Administered 2011-05-04: 2.5 mg via ORAL
  Filled 2011-05-04: qty 1

## 2011-05-04 MED ORDER — DILTIAZEM HCL 60 MG PO TABS
60.0000 mg | ORAL_TABLET | Freq: Four times a day (QID) | ORAL | Status: DC
  Administered 2011-05-04 – 2011-05-07 (×14): 60 mg via ORAL
  Filled 2011-05-04 (×18): qty 1

## 2011-05-04 NOTE — Progress Notes (Signed)
[facility]  Subjective: Feels a little better  Objective: Vital signs in last 24 hours: Temp:  [97.6 F (36.4 C)-98.3 F (36.8 C)] 98.2 F (36.8 C) (11/12 1531) Pulse Rate:  [44-149] 100  (11/12 1531) Resp:  [16-26] 21  (11/12 1531) BP: (87-149)/(38-118) 135/73 mmHg (11/12 1531) SpO2:  [90 %-95 %] 92 % (11/12 1531) Weight:  [84.4 kg (186 lb 1.1 oz)] 186 lb 1.1 oz (84.4 kg) (11/12 0430) Last BM Date: 05/02/11  Intake/Output from previous day: 11/11 0701 - 11/12 0700 In: 1525.8 [P.O.:1080; I.V.:445.8] Out: 656 [Urine:654; Stool:2] Intake/Output this shift: Total I/O In: 133.6 [I.V.:133.6] Out: 2 [Urine:1; Stool:1]  Medications Current Facility-Administered Medications  Medication Dose Route Frequency Provider Last Rate Last Dose  . amiodarone (CORDARONE) 450 mg in dextrose 5 % 250 mL infusion  30 mg/hr Intravenous Continuous Judene Companion, MD 16.7 mL/hr at 05/03/11 1732 30 mg/hr at 05/03/11 1732  . aspirin chewable tablet 81 mg  81 mg Oral Daily Lora Poteet Seay, PHARMD   81 mg at 05/04/11 1000  . calcium-vitamin D (OSCAL WITH D) 500-200 MG-UNIT per tablet 1 tablet  1 tablet Oral Daily Lora Poteet Seay, PHARMD      . cephALEXin (KEFLEX) capsule 500 mg  500 mg Oral Q8H Corneluis Allston   500 mg at 05/04/11 1400  . cholecalciferol (VITAMIN D) tablet 400 Units  400 Units Oral Daily Lenore Cordia, NP   400 Units at 04/28/11 1046  . diltiazem (CARDIZEM) 100 mg in dextrose 5 % 100 mL infusion  10 mg/hr Intravenous Continuous [place], PHARMD   10 mg/hr at 05/04/11 0300  . diltiazem (CARDIZEM) tablet 60 mg  60 mg Oral Q6H Miliana Gangwer   60 mg at 05/04/11 1200  . furosemide (LASIX) injection 40 mg  40 mg Intravenous BID Leone Brand, NP   40 mg at 05/04/11 0800  . heparin ADULT infusion 100 units/mL (25000 units/250 mL)  1,200 Units/hr Intravenous Continuous Wyline Copas, PHARMD 12 mL/hr at 05/04/11 1500 12 mL/hr at 05/04/11 1500    . levalbuterol (XOPENEX) nebulizer solution 0.63 mg  0.63 mg Nebulization Q6H PRN Doniphan Lions, RRT      . levothyroxine (SYNTHROID, LEVOTHROID) tablet 100 mcg  100 mcg Oral Daily Lenore Cordia, NP   100 mcg at 05/04/11 1000  . loratadine (CLARITIN) tablet 10 mg  10 mg Oral Daily Lenore Cordia, NP   10 mg at 04/28/11 1043  . metoprolol tartrate (LOPRESSOR) tablet 25 mg  25 mg Oral BID Kwana Ringel   25 mg at 05/04/11 1000  . metroNIDAZOLE (FLAGYL) tablet 500 mg  500 mg Oral Q6H Dwana Melena, PA   500 mg at 05/04/11 1200  . nitroGLYCERIN (NITROSTAT) SL tablet 0.4 mg  0.4 mg Sublingual PRN Lenore Cordia, NP      . omega-3 acid ethyl esters (LOVAZA) capsule 1 g  1 g Oral BID Lora Poteet Seay, PHARMD   1 g at 05/04/11 1000  . pantoprazole (PROTONIX) EC tablet 40 mg  40 mg Oral Daily Eda Paschal [place], [place]   40 mg at 05/04/11 1000  . potassium chloride SA (K-DUR,KLOR-CON) CR tablet 20 mEq  20 mEq Oral Daily Lora Poteet Seay, PHARMD   20 mEq at 05/04/11 1000  . sodium chloride 0.9 % injection 10 mL  10 mL Intracatheter Q12H Judene Companion, MD   10 mL at 05/04/11 1000  . sodium chloride  0.9 % injection 10 mL  10 mL Intracatheter PRN Judene Companion, MD      . warfarin (COUMADIN) tablet 2.5 mg  2.5 mg Oral ONCE-1800 Wyline Copas, PHARMD   2.5 mg at 05/03/11 1749  . warfarin (COUMADIN) tablet 2.5 mg  2.5 mg Oral ONCE-1800 Crystal Salomon Fick, PHARMD        PE: General appearance: alert, cooperative and no distress Lungs: decreased BS bilaterally.  basilar rales. Heart: irregularly irregular rhythm Extremities: No LEE Pulses: 2+ and symmetric  Lab Results:   Basename 05/04/11 0637 05/03/11 0540  WBC 7.0 8.2  HGB 10.9* 11.2*  HCT 33.6* 35.5*  PLT 286 293   BMET No results found for this basename: NA:3,K:3,CL:3,CO2:3,GLUCOSE:3,BUN:3,CREATININE:3,CALCIUM:3 in the last 72 hours PT/INR  Basename 05/04/11 0637 05/03/11 0540  LABPROT 16.7* 15.5*  INR 1.33 1.20     Studies/Results:    Assessment/Plan    Principal Problem:  *Atrial fibrillation with rapid ventricular response Active Problems:  Clostridium difficile infection  Hypertrophic obstructive cardiomyopathy with diastolic heart failure  Acute on chronic diastolic heart failure  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  IV amiodarone being loaded.  Change to PO 400mg  Daily.  AM CXR.  WBC ok.  Afebrile.  Diltiazem and lopressor added or increased in the last day.      LOS: 7 days    HAGER,BRYAN W 05/04/2011 3:48 PM

## 2011-05-04 NOTE — Progress Notes (Signed)
Heparin level 0.33 in goal for afib. Hgb and platelets relatively stable today.No acute bleeding noted. Loading Coumadin to INR goal 2-3 but will need to monitor for INR increases on Flagyl for +Cdiff.  Plan: Heparin 1200 units/hr. Heparin level and CBC daily Coumadin 2.5 mg today

## 2011-05-04 NOTE — Progress Notes (Signed)
MD notified of patient's BP; new orders received to D/C cardizem drip; will continue to monitor

## 2011-05-04 NOTE — Progress Notes (Signed)
Subjective:  Still with cough and wheezing.some vaginal itching  Objective:  Vital Signs in the last 24 hours: Temp:  [97.6 F (36.4 C)-98.3 F (36.8 C)] 98.2 F (36.8 C) (11/12 1531) Pulse Rate:  [44-139] 100  (11/12 1531) Resp:  [16-26] 21  (11/12 1531) BP: (87-149)/(38-118) 135/73 mmHg (11/12 1531) SpO2:  [90 %-95 %] 92 % (11/12 1531) Weight:  [84.4 kg (186 lb 1.1 oz)] 186 lb 1.1 oz (84.4 kg) (11/12 0430)  Intake/Output from previous day: 11/11 0701 - 11/12 0700 In: 1525.8 [P.O.:1080; I.V.:445.8] Out: 656 [Urine:654; Stool:2] Intake/Output from this shift: Total I/O In: 133.6 [I.V.:133.6] Out: 3 [Urine:2; Stool:1]  Physical Exam: Lungs: wheezes bibasilar Heart: Rapid & irregular Extremities: edema trace  Lab Results:  Basename 05/04/11 0637 05/03/11 0540  WBC 7.0 8.2  HGB 10.9* 11.2*  PLT 286 293   Lab Results  Component Value Date   INR 1.33 05/04/2011   INR 1.20 05/03/2011   INR 1.11 04/27/2011    Imaging: No results found.  Cardiac Studies:  Assessment/Plan:  Atrial Fibrillation  LOS: 7 days  Atrial Fibrillation: Transitioning to oral amiodarone and diltiazem. Rate improved but still tachaycardic. CHADS 2 score 5. Currently being bridged with heparin and coumadin. INR 1.3 today (1.2 yesterday). Plan for cardioversion on wed.  LITTLE, ALFRED B 05/04/2011, 6:21 PM

## 2011-05-05 LAB — CBC
HCT: 33.5 % — ABNORMAL LOW (ref 36.0–46.0)
Hemoglobin: 10.9 g/dL — ABNORMAL LOW (ref 12.0–15.0)
MCHC: 32.5 g/dL (ref 30.0–36.0)
MCV: 92.8 fL (ref 78.0–100.0)
RDW: 14.6 % (ref 11.5–15.5)
WBC: 7 10*3/uL (ref 4.0–10.5)

## 2011-05-05 LAB — PROTIME-INR
INR: 1.36 (ref 0.00–1.49)
Prothrombin Time: 17 seconds — ABNORMAL HIGH (ref 11.6–15.2)

## 2011-05-05 MED ORDER — WARFARIN SODIUM 4 MG PO TABS
4.0000 mg | ORAL_TABLET | Freq: Once | ORAL | Status: AC
  Administered 2011-05-05: 2.5 mg via ORAL
  Filled 2011-05-05: qty 1

## 2011-05-05 MED ORDER — MICONAZOLE NITRATE 2 % EX CREA
TOPICAL_CREAM | CUTANEOUS | Status: DC | PRN
  Filled 2011-05-05: qty 14

## 2011-05-05 NOTE — Progress Notes (Signed)
CSW met with pt and pt's husband at bedside. There was discussion that pt. Would return to the ALF at [facility] instead of the independent living at [facility]. Pt wants to return home to the independent living with home health. CSW contacted the facility to make them aware of this. CSW will continue to follow to help with d/c planning.

## 2011-05-05 NOTE — Progress Notes (Signed)
ANTICOAGULATION CONSULT NOTE - Follow Up Consult  Pharmacy Consult for Heparin/Coumadin Indication: atrial fibrillation  Allergies  Allergen Reactions  . Metoprolol Other (See Comments)    Bradycardia  . Codeine Nausea Only    And dizziness  . Seldane (Terfenadine) Nausea Only    And dizziness  . Statins     myalgia    Patient Measurements: Height: 5' 3.5" (161.3 cm) Weight: 186 lb 1.1 oz (84.4 kg) IBW/kg (Calculated) : 53.55  Adjusted Body Weight:    Vital Signs: Temp: 98.3 F (36.8 C) (11/13 0800) Temp src: Oral (11/13 0800) BP: 160/64 mmHg (11/13 0800) Pulse Rate: 74  (11/13 0800)  Labs:  Basename 05/05/11 0620 05/04/11 0637 05/03/11 0540  HGB 10.9* 10.9* --  HCT 33.5* 33.6* 35.5*  PLT 302 286 293  APTT -- -- --  LABPROT 17.0* 16.7* 15.5*  INR 1.36 1.33 1.20  HEPARINUNFRC 0.26* 0.33 0.52  CREATININE -- -- --  CKTOTAL -- -- --  CKMB -- -- --  TROPONINI -- -- --   Estimated Creatinine Clearance: 35 ml/min (by C-G formula based on Cr of 1.31).   Medications:     Assessment: Afib s/p failed cardioversion now in NSR on po Amiodarone. Heparin level slightly < goal 0.3-0.7. Overlap until INR therapeutic 2-3. Watch for INR rapid increases while on po Flagyl. CBC stable.  Goal of Therapy:  Heparin level 0.3-0.7 units/ml   Plan:  Increase heparin to 1350 units/hr. Next heparin level in am. Increase Coumadin slightly to 4mg  today.  Merilynn Finland, [employer] 05/05/2011,12:55 PM

## 2011-05-05 NOTE — Progress Notes (Signed)
  Subjective:  Still having some diarrhea, no increased SOB.  Objective:  Vital Signs in the last 24 hours: Temp:  [97.5 F (36.4 C)-98.5 F (36.9 C)] 98.3 F (36.8 C) (11/13 0800) Pulse Rate:  [74-120] 74  (11/13 0800) Resp:  [16-21] 19  (11/13 0800) BP: (118-162)/(51-79) 160/64 mmHg (11/13 0800) SpO2:  [92 %-95 %] 94 % (11/13 0800)  Intake/Output from previous day: 11/12 0701 - 11/13 0700 In: 150.3 [I.V.:150.3] Out: 357 [Urine:353; Stool:4]  Physical Exam: General appearance: alert, cooperative and no distress Lungs: clear to auscultation bilaterally Heart: regular rate and rhythm, S1, S2 normal, no murmur, click, rub or gallop   Rate: 75  Rhythm: normal sinus rhythm  Lab Results:  Basename 05/05/11 0620 05/04/11 0637  WBC 7.0 7.0  HGB 10.9* 10.9*  PLT 302 286   No results found for this basename: NA:2,K:2,CL:2,CO2:2,GLUCOSE:2,BUN:2,CREATININE:2 in the last 72 hours No results found for this basename: TROPONINI:2,CK,MB:2 in the last 72 hours Hepatic Function Panel No results found for this basename: PROT,ALBUMIN,AST,ALT,ALKPHOS,BILITOT,BILIDIR,IBILI in the last 72 hours No results found for this basename: CHOL in the last 72 hours No results found for this basename: PROTIME in the last 72 hours  Imaging: No results found.  Cardiac Studies:  Assessment/Plan:   Principal Problem:  *Atrial fibrillation with rapid ventricular response, pt failed cardioversion earlier this adm but has now converted to NSR on Amio. Active Problems:  Hypertension  Clostridium difficile infection, on Rx  Hypertrophic obstructive cardiomyopathy  Acute on chronic diastolic heart failure  Mitral insufficiency due to HOCM and ruptured chorda tendina   Plan- Monostat cream for vaginal itching, continue Heparin to Coumadin crossover. D/C Keflex, check f/u BNP. Continue IV lasix today. Continue ABs for C-Diff.    Carla Shelter PA-C 05/05/2011, 12:02 PM    Heart rhythm stable NSR.  May be DC in am .louvenox coumadin cross over an option. Lungs are much clear than last pm.  Black, Carla B

## 2011-05-06 LAB — PROTIME-INR
INR: 1.36 (ref 0.00–1.49)
Prothrombin Time: 17 seconds — ABNORMAL HIGH (ref 11.6–15.2)

## 2011-05-06 LAB — BASIC METABOLIC PANEL
BUN: 18 mg/dL (ref 6–23)
CO2: 33 mEq/L — ABNORMAL HIGH (ref 19–32)
Calcium: 8.9 mg/dL (ref 8.4–10.5)
Chloride: 97 mEq/L (ref 96–112)
Creatinine, Ser: 1.24 mg/dL — ABNORMAL HIGH (ref 0.50–1.10)
GFR calc Af Amer: 46 mL/min — ABNORMAL LOW (ref >90)
GFR calc non Af Amer: 40 mL/min — ABNORMAL LOW (ref >90)
Glucose, Bld: 106 mg/dL — ABNORMAL HIGH (ref 70–99)
Potassium: 3.4 mEq/L — ABNORMAL LOW (ref 3.5–5.1)
Sodium: 138 mEq/L (ref 135–145)

## 2011-05-06 LAB — HEPARIN LEVEL (UNFRACTIONATED): Heparin Unfractionated: 0.44 IU/mL (ref 0.30–0.70)

## 2011-05-06 LAB — CBC
HCT: 35.7 % — ABNORMAL LOW (ref 36.0–46.0)
Hemoglobin: 11.4 g/dL — ABNORMAL LOW (ref 12.0–15.0)
RBC: 3.8 MIL/uL — ABNORMAL LOW (ref 3.87–5.11)

## 2011-05-06 LAB — PRO B NATRIURETIC PEPTIDE: Pro B Natriuretic peptide (BNP): 4925 pg/mL — ABNORMAL HIGH (ref 0–450)

## 2011-05-06 MED ORDER — WARFARIN SODIUM 5 MG PO TABS
5.0000 mg | ORAL_TABLET | Freq: Once | ORAL | Status: AC
  Administered 2011-05-06: 5 mg via ORAL
  Filled 2011-05-06: qty 1

## 2011-05-06 MED ORDER — MAGNESIUM OXIDE 400 MG PO TABS
400.0000 mg | ORAL_TABLET | Freq: Two times a day (BID) | ORAL | Status: DC
  Administered 2011-05-06 – 2011-05-10 (×9): 400 mg via ORAL
  Filled 2011-05-06 (×11): qty 1

## 2011-05-06 MED ORDER — METOPROLOL TARTRATE 50 MG PO TABS
50.0000 mg | ORAL_TABLET | Freq: Two times a day (BID) | ORAL | Status: DC
  Administered 2011-05-06 – 2011-05-07 (×2): 50 mg via ORAL
  Filled 2011-05-06 (×3): qty 1

## 2011-05-06 NOTE — Progress Notes (Signed)
ANTICOAGULATION CONSULT NOTE - Follow Up Consult  Pharmacy Consult for Heparin and Coumadin Indication: atrial fibrillation  Allergies  Allergen Reactions  . Metoprolol Other (See Comments)    Bradycardia  . Codeine Nausea Only    And dizziness  . Seldane (Terfenadine) Nausea Only    And dizziness  . Statins     myalgia    Patient Measurements: Height: 5' 3.5" (161.3 cm) Weight: 196 lb 10.4 oz (89.2 kg) IBW/kg (Calculated) : 53.55  Adjusted Body Weight:   Vital Signs: Temp: 97.7 F (36.5 C) (11/14 0803) Temp src: Oral (11/14 0803) BP: 135/60 mmHg (11/14 0803) Pulse Rate: 73  (11/14 0803)  Labs:  Basename 05/06/11 0605 05/05/11 0620 05/04/11 0637  HGB 11.4* 10.9* --  HCT 35.7* 33.5* 33.6*  PLT 332 302 286  APTT -- -- --  LABPROT 17.0* 17.0* 16.7*  INR 1.36 1.36 1.33  HEPARINUNFRC 0.44 0.26* 0.33  CREATININE 1.24* -- --  CKTOTAL -- -- --  CKMB -- -- --  TROPONINI -- -- --   Estimated Creatinine Clearance: 38.1 ml/min (by C-G formula based on Cr of 1.24).   Medications:  Heparin drip 1350 units/hr  Assessment: 83yof on Heparin bridging to Coumadin for Afib. Heparin level (0.44) is therapeutic - continue current rate. INR (1.36) is subtherapeutic - will increase dose. - H/H and Plts improved - No significant bleeding reproted  Goal of Therapy:  INR 2-3 Heparin level 0.3-0.7 units/ml   Plan:  1. Continue heparin drip 1350 units/hr (13.5 ml/hr) 2. Coumadin 5mg  po x1 3. Follow-up AM INR and heparin level  Cleon Dew [phone] 05/06/2011,9:52 AM

## 2011-05-06 NOTE — Progress Notes (Signed)
This patient was discussed at long LOS rounds this morning. 

## 2011-05-06 NOTE — Progress Notes (Signed)
Agree with note written by Luke Kilroy PAC Jameila [name] J 05/06/2011  

## 2011-05-06 NOTE — Progress Notes (Addendum)
  Subjective:  "tired" this am. Went into rapid AF around 10am when up to bathroom.  Objective:  Vital Signs in the last 24 hours: Temp:  [97.7 F (36.5 C)-98.8 F (37.1 C)] 97.7 F (36.5 C) (11/14 0803) Pulse Rate:  [73-88] 73  (11/14 0803) Resp:  [19-22] 21  (11/14 0803) BP: (118-169)/(48-63) 135/60 mmHg (11/14 0803) SpO2:  [92 %-98 %] 92 % (11/14 0803) Weight:  [89.2 kg (196 lb 10.4 oz)] 196 lb 10.4 oz (89.2 kg) (11/14 0440)  Intake/Output from previous day: 11/13 0701 - 11/14 0700 In: 151.5 [I.V.:151.5] Out: 354 [Urine:352; Stool:2]  Physical Exam: General appearance: alert, cooperative and no distress Lungs: few basilar crackles bilat Heart: irregularly irregular rhythm, 2/6 syst murmur LSB   Rate: 128  Rhythm: ventricular fibrillation  Lab Results:  Basename 05/06/11 0605 05/05/11 0620  WBC 8.5 7.0  HGB 11.4* 10.9*  PLT 332 302    Basename 05/06/11 0605  NA 138  K 3.4*  CL 97  CO2 33*  GLUCOSE 106*  BUN 18  CREATININE 1.24*   No results found for this basename: TROPONINI:2,CK,MB:2 in the last 72 hours Hepatic Function Panel No results found for this basename: PROT,ALBUMIN,AST,ALT,ALKPHOS,BILITOT,BILIDIR,IBILI in the last 72 hours No results found for this basename: CHOL in the last 72 hours No results found for this basename: PROTIME in the last 72 hours  Imaging: No results found.  Cardiac Studies:  Assessment/Plan:   Principal Problem:  *Atrial fibrillation with rapid ventricular response, she was in NSR yesterday on Amio but reverted to AF this am. Active Problems:  Hypertension  Clostridium difficile infection  Hypertrophic obstructive cardiomyopathy with diastolic heart failure  Acute on chronic diastolic heart failure, BNP still elevated but pt in and out of rapid AF  Mitral insufficiency due to HOCM and ruptured chorda tendina  Hypokaylemia   Plan- She is just now getting AM meds, Amio, Lopressor, Diltiazem, k+. Will wait 1 hr and then  give (?) Amio bolus if HR still fast. Will add Mg++ po (1.6)   Corine Shelter PA-C 05/06/2011, 10:46 AM  She had been in NSR at 70-79 bpm early today, now back in AF w RVR 110-115 bpm on average.Still w cough, no dyspnea at rest. INR still subtherapeutic.  Since AF starts/stops on its own, unlikely to be helped by DC CV again. Continue efforts at rate control. As amiodarone builds up, hope to see improved rate/rhythm.  DC home when INR >2 and HR<110 consistently, regardless of rhythm. If rate cannot be adequately controlled consider EP evaluation for AVN ablation and PPM versus AF ablation.  Nevena Rozenberg MD 5:23 PM

## 2011-05-07 LAB — PROTIME-INR
INR: 1.58 — ABNORMAL HIGH (ref 0.00–1.49)
Prothrombin Time: 19.2 seconds — ABNORMAL HIGH (ref 11.6–15.2)

## 2011-05-07 LAB — BASIC METABOLIC PANEL
BUN: 25 mg/dL — ABNORMAL HIGH (ref 6–23)
CO2: 28 mEq/L (ref 19–32)
Calcium: 8.4 mg/dL (ref 8.4–10.5)
Chloride: 94 mEq/L — ABNORMAL LOW (ref 96–112)
Creatinine, Ser: 1.63 mg/dL — ABNORMAL HIGH (ref 0.50–1.10)
GFR calc Af Amer: 33 mL/min — ABNORMAL LOW (ref >90)
GFR calc non Af Amer: 28 mL/min — ABNORMAL LOW (ref >90)
Glucose, Bld: 139 mg/dL — ABNORMAL HIGH (ref 70–99)
Potassium: 3.6 mEq/L (ref 3.5–5.1)
Sodium: 135 mEq/L (ref 135–145)

## 2011-05-07 LAB — CBC
Hemoglobin: 11.4 g/dL — ABNORMAL LOW (ref 12.0–15.0)
Platelets: 353 10*3/uL (ref 150–400)
RBC: 3.76 MIL/uL — ABNORMAL LOW (ref 3.87–5.11)
WBC: 9.9 10*3/uL (ref 4.0–10.5)

## 2011-05-07 LAB — HEPARIN LEVEL (UNFRACTIONATED): Heparin Unfractionated: 0.48 IU/mL (ref 0.30–0.70)

## 2011-05-07 MED ORDER — METOPROLOL TARTRATE 50 MG PO TABS
75.0000 mg | ORAL_TABLET | Freq: Two times a day (BID) | ORAL | Status: DC
  Administered 2011-05-07 – 2011-05-08 (×2): 75 mg via ORAL
  Filled 2011-05-07 (×3): qty 1

## 2011-05-07 MED ORDER — WARFARIN SODIUM 5 MG PO TABS
5.0000 mg | ORAL_TABLET | Freq: Once | ORAL | Status: DC
  Filled 2011-05-07: qty 1

## 2011-05-07 MED ORDER — GUAIFENESIN ER 600 MG PO TB12
600.0000 mg | ORAL_TABLET | Freq: Two times a day (BID) | ORAL | Status: DC
  Administered 2011-05-07 – 2011-05-10 (×6): 600 mg via ORAL
  Filled 2011-05-07 (×8): qty 1

## 2011-05-07 MED ORDER — GUAIFENESIN 100 MG/5ML PO SOLN
5.0000 mL | ORAL | Status: DC | PRN
  Administered 2011-05-09: 100 mg via ORAL
  Filled 2011-05-07: qty 5

## 2011-05-07 MED ORDER — FUROSEMIDE 40 MG PO TABS: 40.0000 mg | ORAL_TABLET | Freq: Every day | ORAL | Status: DC

## 2011-05-07 NOTE — Progress Notes (Signed)
  Subjective:  No SOB, dry cough, anorexia since adm.  Objective:  Vital Signs in the last 24 hours: Temp:  [97.5 F (36.4 C)-98.4 F (36.9 C)] 98.4 F (36.9 C) (11/15 0800) Pulse Rate:  [98-126] 116  (11/15 0800) Resp:  [20-24] 23  (11/15 0800) BP: (103-135)/(54-72) 114/72 mmHg (11/15 0800) SpO2:  [94 %-98 %] 94 % (11/15 0800) Weight:  [88.2 kg (194 lb 7.1 oz)] 194 lb 7.1 oz (88.2 kg) (11/15 0509)  Intake/Output from previous day: 11/14 0701 - 11/15 0700 In: 278.5 [P.O.:120; I.V.:158.5] Out: 206 [Urine:201; Stool:5]  Physical Exam: General appearance: alert, cooperative and no distress Lungs: basilar crackles on Lt Heart: irregularly irregular rhythm and systolic murmur: holosystolic 2/6, medium pitch at 2nd left intercostal space   Rate: 90-130  Rhythm: atrial fibrillation  Lab Results:  Basename 05/07/11 0507 05/06/11 0605  WBC 9.9 8.5  HGB 11.4* 11.4*  PLT 353 332    Basename 05/07/11 0507 05/06/11 0605  NA 135 138  K 3.6 3.4*  CL 94* 97  CO2 28 33*  GLUCOSE 139* 106*  BUN 25* 18  CREATININE 1.63* 1.24*   No results found for this basename: TROPONINI:2,CK,MB:2 in the last 72 hours Hepatic Function Panel No results found for this basename: PROT,ALBUMIN,AST,ALT,ALKPHOS,BILITOT,BILIDIR,IBILI in the last 72 hours No results found for this basename: CHOL in the last 72 hours No results found for this basename: PROTIME in the last 72 hours  Imaging: No results found.  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 Continue heparin to coumadin, stop IV lasix, decrease  to once a day in am. Ambulate and monitor heart rhythm. Home when INR therapeutic and  HR controlled.  Corine Shelter PA-C 05/07/2011, 10:12 AM  Examined by me. Agree w Susette Racer evaluation.  Some improvement in HR (remains in AF w RVR). At night HR around 90-100, now 100-115. Increase metoprolol further. INR finally increasing. Hold diuretic altogether for 1-2 days due to marked increase in BUN/creat.  Resume PO furosemide 20 mg daily and KCL suppl at dc. DC home w amiodarone, metoprolol and flagyl as new meds.  Will need Home Health for PT and preferably PT/INR draws.  If INR close to therapeutic but not quite >2.0, consider one shot of enoxaparin "for the road" prior to dc.  Reevaluate in 3-4 weeks, which will allow time for full amiodarone "loading". If still in AF, then we can then consider repeat cardioversion.

## 2011-05-07 NOTE — Progress Notes (Signed)
CSW contacted [facility] and they are going to provide the patient's PT/OT and they also can draw the PT/INR. Pt will just need to be d/c with orders for these services. Please call me if you have anymore requests.Sabino Niemann, [place] [phone]

## 2011-05-07 NOTE — Progress Notes (Signed)
Noted in [facility] that NCM had already asked patient about choice for Home Health. Noted orders for [facility] and sent via [facility] to [facility]. Genella Rife Weeks 05/07/2011 122pm

## 2011-05-07 NOTE — Progress Notes (Signed)
Received call from CSW Amy that patient and husband want the [facility] facility to provide home care services and that she has arranged. Canceled [facility]. Genella Rife Weeks 05/07/2011  3pm

## 2011-05-07 NOTE — Progress Notes (Signed)
ANTICOAGULATION CONSULT NOTE - Follow Up Consult  Pharmacy Consult for Heparin and Coumadin Indication: atrial fibrillation  Allergies  Allergen Reactions  . Metoprolol Other (See Comments)    Bradycardia  . Codeine Nausea Only    And dizziness  . Seldane (Terfenadine) Nausea Only    And dizziness  . Statins     myalgia    Patient Measurements: Height: 5' 3.5" (161.3 cm) Weight: 194 lb 7.1 oz (88.2 kg) IBW/kg (Calculated) : 53.55  Adjusted Body Weight:   Vital Signs: Temp: 98.4 F (36.9 C) (11/15 0800) Temp src: Oral (11/15 0800) BP: 114/72 mmHg (11/15 0800) Pulse Rate: 116  (11/15 0800)  Labs:  Basename 05/07/11 0507 05/06/11 0605 05/05/11 0620  HGB 11.4* 11.4* --  HCT 34.8* 35.7* 33.5*  PLT 353 332 302  APTT -- -- --  LABPROT 19.2* 17.0* 17.0*  INR 1.58* 1.36 1.36  HEPARINUNFRC 0.48 0.44 0.26*  CREATININE 1.63* 1.24* --  CKTOTAL -- -- --  CKMB -- -- --  TROPONINI -- -- --   Estimated Creatinine Clearance: 28.8 ml/min (by C-G formula based on Cr of 1.63).   Medications:  Heparin 1350 units/hr  Assessment: 83 yof on Heparin bridging to Coumadin for Afib. Heparin level (0.48) remains therapeutic on 1350 units/hr. INR (1.58) is subtherapeutic but finally trended up with 5mg  dose. Please note - pt also has Flagyl ordered which can increase INR, monitor closely.  - H/H and Plts stable - No bleeding reported  Goal of Therapy:  INR 2-3 Heparin level 0.3-0.7 units/ml   Plan:  1. Continue heparin drip 1350 units/hr (13.5 ml/hr) 2. Coumadin 5mg  po x1 3. Follow-up AM INR and heparin level  Cleon Dew [phone] 05/07/2011,1:48 PM

## 2011-05-08 LAB — BASIC METABOLIC PANEL
BUN: 30 mg/dL — ABNORMAL HIGH (ref 6–23)
CO2: 28 mEq/L (ref 19–32)
Calcium: 8.8 mg/dL (ref 8.4–10.5)
Chloride: 97 mEq/L (ref 96–112)
Creatinine, Ser: 1.64 mg/dL — ABNORMAL HIGH (ref 0.50–1.10)
GFR calc Af Amer: 33 mL/min — ABNORMAL LOW (ref >90)
GFR calc non Af Amer: 28 mL/min — ABNORMAL LOW (ref >90)
Glucose, Bld: 128 mg/dL — ABNORMAL HIGH (ref 70–99)
Potassium: 3.4 mEq/L — ABNORMAL LOW (ref 3.5–5.1)
Sodium: 135 mEq/L (ref 135–145)

## 2011-05-08 LAB — PROTIME-INR
INR: 1.94 — ABNORMAL HIGH (ref 0.00–1.49)
Prothrombin Time: 22.5 seconds — ABNORMAL HIGH (ref 11.6–15.2)

## 2011-05-08 LAB — CBC
Hemoglobin: 11.1 g/dL — ABNORMAL LOW (ref 12.0–15.0)
MCHC: 32.4 g/dL (ref 30.0–36.0)
RBC: 3.69 MIL/uL — ABNORMAL LOW (ref 3.87–5.11)
WBC: 8.7 10*3/uL (ref 4.0–10.5)

## 2011-05-08 LAB — HEPARIN LEVEL (UNFRACTIONATED): Heparin Unfractionated: 0.37 IU/mL (ref 0.30–0.70)

## 2011-05-08 MED ORDER — METOPROLOL TARTRATE 100 MG PO TABS
100.0000 mg | ORAL_TABLET | Freq: Two times a day (BID) | ORAL | Status: DC
  Administered 2011-05-08 – 2011-05-10 (×4): 100 mg via ORAL
  Filled 2011-05-08 (×5): qty 1

## 2011-05-08 MED ORDER — WARFARIN SODIUM 2.5 MG PO TABS
2.5000 mg | ORAL_TABLET | Freq: Once | ORAL | Status: AC
Start: 2011-05-08 — End: 2011-05-08
  Administered 2011-05-08: 2.5 mg via ORAL
  Filled 2011-05-08: qty 1

## 2011-05-08 NOTE — Progress Notes (Signed)
CSW met with patient to discuss discharge plans. Pt. Informed CSW that she is not going to be d/c'd today. Pt. Was told that she might be d/c's tomorrow in the am.   CSW contacted the CSW at [facility] to let inform them of this. CSW will let the weekend CSW know this as well.

## 2011-05-08 NOTE — Progress Notes (Signed)
ANTICOAGULATION CONSULT NOTE - Follow Up Consult  Pharmacy Consult for Heparin and Coumadin Indication: atrial fibrillation  Allergies  Allergen Reactions  . Metoprolol Other (See Comments)    Bradycardia  . Codeine Nausea Only    And dizziness  . Seldane (Terfenadine) Nausea Only    And dizziness  . Statins     myalgia    Patient Measurements: Height: 5' 3.5" (161.3 cm) Weight: 179 lb 0.2 oz (81.2 kg) IBW/kg (Calculated) : 53.55  Adjusted Body Weight:   Vital Signs: Temp: 98.4 F (36.9 C) (11/16 0833) Temp src: Oral (11/16 0833) BP: 129/79 mmHg (11/16 0833) Pulse Rate: 125  (11/16 0833)  Labs:  Basename 05/08/11 0435 05/07/11 0507 05/06/11 0605  HGB 11.1* 11.4* --  HCT 34.3* 34.8* 35.7*  PLT 357 353 332  APTT -- -- --  LABPROT 22.5* 19.2* 17.0*  INR 1.94* 1.58* 1.36  HEPARINUNFRC 0.37 0.48 0.44  CREATININE 1.64* 1.63* 1.24*  CKTOTAL -- -- --  CKMB -- -- --  TROPONINI -- -- --   Estimated Creatinine Clearance: 27.4 ml/min (by C-G formula based on Cr of 1.64).   Medications:  Heparin 1350 units/hr  Assessment: 83 yof on Heparin bridging to Coumadin for Afib. Heparin level (0.37) remains therapeutic on 1350 units/hr. INR (1.94) is subtherapeutic but near goal. Please note - 11/15 Coumadin dose ordered (5mg ) but was never given (not charted and found in med drawer).  Increasing INR despite missing a dose most likely due to drug interactions with metronidazole and amiodarone, which can both increase the INR. Continue to monitor the INR and interaction effects closely. - H/H and Plts stable - No bleeding reported  Goal of Therapy:  INR 2-3 Heparin level 0.3-0.7 units/ml   Plan:  1. Continue heparin drip 1350 units/hr (13.5 ml/hr) 2. Coumadin 2.5mg  po x 1 3. Follow-up AM INR, heparin level, and discharge plans  Carla Black 05/08/2011,9:01 AM

## 2011-05-08 NOTE — Progress Notes (Addendum)
Pt. Seen and examined. Agree with the NP/PA-C note as written. HR still somewhat elevated .Marland Kitchen Will increase metoprolol. INR 1.94 today .Marland Kitchen She is somewhat deconditioned. Ambulate today. Anticipate DC tomorrow.  Chrystie Nose, MD Attending Cardiologist [facility]

## 2011-05-08 NOTE — Progress Notes (Signed)
  Subjective:  Frustrated, wants to go home. Ambulated once yesterday and had to stop and rest.  Objective:  Vital Signs in the last 24 hours: Temp:  [98.2 F (36.8 C)-98.6 F (37 C)] 98.4 F (36.9 C) (11/16 0833) Pulse Rate:  [101-125] 125  (11/16 0833) Resp:  [18-28] 22  (11/16 0833) BP: (103-142)/(53-95) 129/79 mmHg (11/16 0833) SpO2:  [95 %-99 %] 98 % (11/16 0833) Weight:  [81.2 kg (179 lb 0.2 oz)] 179 lb 0.2 oz (81.2 kg) (11/16 0600)  Intake/Output from previous day: 11/15 0701 - 11/16 0700 In: 229.5 [I.V.:229.5] Out: -   Physical Exam: General appearance: alert, cooperative and no distress Chest clear Cardiac irreg irreg 2/6 syst murmur LSB   Rate: 120  Rhythm: atrial fibrillation  Lab Results:  Basename 05/08/11 0435 05/07/11 0507  WBC 8.7 9.9  HGB 11.1* 11.4*  PLT 357 353    Basename 05/08/11 0435 05/07/11 0507  NA 135 135  K 3.4* 3.6  CL 97 94*  CO2 28 28  GLUCOSE 128* 139*  BUN 30* 25*  CREATININE 1.64* 1.63*   No results found for this basename: TROPONINI:2,CK,MB:2 in the last 72 hours Hepatic Function Panel No results found for this basename: PROT,ALBUMIN,AST,ALT,ALKPHOS,BILITOT,BILIDIR,IBILI in the last 72 hours No results found for this basename: CHOL in the last 72 hours  Basename 05/08/11 0435  INR 1.94*    Imaging: No results found.  Cardiac Studies:  Assessment/Plan:   Principal Problem:  *Atrial fibrillation with rapid ventricular response Active Problems:  Hypertension  Clostridium difficile infection  Hypertrophic obstructive cardiomyopathy with diastolic heart failure  Acute on chronic diastolic heart failure  Mitral insufficiency due to HOCM and ruptured chorda tendina    Plan- diltiazem 60mg  Q6 stopped 48hrs ago, will start Diltiazem 180mg  today. No Lanoxin per Dr Royann Shivers. Home when HR under control, K+ ordered  [employer] PA-C 05/08/2011, 10:36 AM

## 2011-05-09 ENCOUNTER — Other Ambulatory Visit: Payer: Self-pay

## 2011-05-09 LAB — BASIC METABOLIC PANEL
BUN: 30 mg/dL — ABNORMAL HIGH (ref 6–23)
CO2: 27 mEq/L (ref 19–32)
Calcium: 8.6 mg/dL (ref 8.4–10.5)
Chloride: 92 mEq/L — ABNORMAL LOW (ref 96–112)
Creatinine, Ser: 1.45 mg/dL — ABNORMAL HIGH (ref 0.50–1.10)
GFR calc Af Amer: 38 mL/min — ABNORMAL LOW (ref >90)
GFR calc non Af Amer: 33 mL/min — ABNORMAL LOW (ref >90)
Glucose, Bld: 112 mg/dL — ABNORMAL HIGH (ref 70–99)
Potassium: 3.5 mEq/L (ref 3.5–5.1)
Sodium: 131 mEq/L — ABNORMAL LOW (ref 135–145)

## 2011-05-09 LAB — CBC
Hemoglobin: 11.4 g/dL — ABNORMAL LOW (ref 12.0–15.0)
MCHC: 31.7 g/dL (ref 30.0–36.0)
RDW: 15 % (ref 11.5–15.5)

## 2011-05-09 LAB — PROTIME-INR
INR: 2.18 — ABNORMAL HIGH (ref 0.00–1.49)
Prothrombin Time: 24.6 seconds — ABNORMAL HIGH (ref 11.6–15.2)

## 2011-05-09 LAB — HEPARIN LEVEL (UNFRACTIONATED): Heparin Unfractionated: 0.61 IU/mL (ref 0.30–0.70)

## 2011-05-09 MED ORDER — AMIODARONE HCL 200 MG PO TABS
200.0000 mg | ORAL_TABLET | Freq: Every day | ORAL | Status: DC
  Administered 2011-05-10: 200 mg via ORAL
  Filled 2011-05-09: qty 1

## 2011-05-09 MED ORDER — FUROSEMIDE 20 MG PO TABS
20.0000 mg | ORAL_TABLET | Freq: Every day | ORAL | Status: DC
  Filled 2011-05-09: qty 1

## 2011-05-09 MED ORDER — DILTIAZEM HCL ER COATED BEADS 180 MG PO CP24
180.0000 mg | ORAL_CAPSULE | Freq: Every day | ORAL | Status: DC
  Administered 2011-05-09 – 2011-05-10 (×2): 180 mg via ORAL
  Filled 2011-05-09 (×2): qty 1

## 2011-05-09 MED ORDER — WARFARIN SODIUM 2.5 MG PO TABS
2.5000 mg | ORAL_TABLET | Freq: Once | ORAL | Status: AC
  Administered 2011-05-09: 2.5 mg via ORAL
  Filled 2011-05-09: qty 1

## 2011-05-09 NOTE — Progress Notes (Signed)
ANTICOAGULATION CONSULT NOTE - Follow Up Consult  Pharmacy Consult for Heparin and Coumadin Indication: atrial fibrillation  Allergies  Allergen Reactions  . Metoprolol Other (See Comments)    Bradycardia  . Codeine Nausea Only    And dizziness  . Seldane (Terfenadine) Nausea Only    And dizziness  . Statins     myalgia    Patient Measurements: Height: 5' 3.5" (161.3 cm) Weight: 179 lb 0.2 oz (81.2 kg) IBW/kg (Calculated) : 53.55    Vital Signs: Temp: 97.7 F (36.5 C) (11/17 0800) Temp src: Oral (11/17 0800) BP: 153/91 mmHg (11/17 0800) Pulse Rate: 115  (11/17 0800)  Labs:  Basename 05/09/11 0610 05/08/11 0435 05/07/11 0507  HGB 11.4* 11.1* --  HCT 36.0 34.3* 34.8*  PLT 382 357 353  APTT -- -- --  LABPROT 24.6* 22.5* 19.2*  INR 2.18* 1.94* 1.58*  HEPARINUNFRC 0.61 0.37 0.48  CREATININE 1.45* 1.64* 1.63*  CKTOTAL -- -- --  CKMB -- -- --  TROPONINI -- -- --   Estimated Creatinine Clearance: 31 ml/min (by C-G formula based on Cr of 1.45).   Medications:  Heparin 1350 units/hr Coumadin 2.5mg  x1 yesterday  Assessment: 77 yo F on Heparin bridging to Coumadin for Afib with RVR. Heparin level (0.61) remains therapeutic though trended up on 1350 units/hr. INR (2.18) is therapeutic today. Please note - pt also taking Flagyl for CDiff and amiodarone which can both increase INR, monitor closely. -CBC stable -SCr decreased today to 1.45.    Goal of Therapy:  INR 2-3 Heparin level 0.3-0.7 units/ml   Plan:  1. D/c heparin gtt (therapeutic INR) 2. Coumadin 2.5 mg po x1 today at 1800  3. Follow-up AM INR and heparin level  Concha Norway [phone] 05/09/2011,8:52 AM

## 2011-05-09 NOTE — Progress Notes (Signed)
  Subjective:  Still hasn't been up much, she wants to go home  Objective:  Vital Signs in the last 24 hours: Temp:  [97.4 F (36.3 C)-97.9 F (36.6 C)] 97.7 F (36.5 C) (11/17 0800) Pulse Rate:  [102-115] 115  (11/17 0800) Resp:  [21-27] 27  (11/17 0800) BP: (104-153)/(72-91) 153/91 mmHg (11/17 0800) SpO2:  [92 %-98 %] 93 % (11/17 0800)  Intake/Output from previous day: 11/16 0701 - 11/17 0700 In: 250 [P.O.:240; I.V.:10] Out: -   Physical Exam: General appearance: alert, cooperative and no distress Lungs: clear to auscultation bilaterally Heart: irregularly irregular rhythm   Rate: 100-120  Rhythm: atrial fibrillation  Lab Results:  Basename 05/09/11 0610 05/08/11 0435  WBC 7.6 8.7  HGB 11.4* 11.1*  PLT 382 357    Basename 05/09/11 0610 05/08/11 0435  NA 131* 135  K 3.5 3.4*  CL 92* 97  CO2 27 28  GLUCOSE 112* 128*  BUN 30* 30*  CREATININE 1.45* 1.64*   No results found for this basename: TROPONINI:2,CK,MB:2 in the last 72 hours Hepatic Function Panel No results found for this basename: PROT,ALBUMIN,AST,ALT,ALKPHOS,BILITOT,BILIDIR,IBILI in the last 72 hours No results found for this basename: CHOL in the last 72 hours  Basename 05/09/11 0610  INR 2.18*    Imaging: No results found.  Cardiac Studies:  Assessment/Plan:   Principal Problem:  *Atrial fibrillation with rapid ventricular response Active Problems:  Hypertrophic obstructive cardiomyopathy with diastolic heart failure  Acute on chronic diastolic heart failure  Anticoagulation monitoring, INR range 2-3  Hypertension  Clostridium difficile infection  Mitral insufficiency due to HOCM and ruptured chorda tendina   Plan- lasix stopped secondary to increasing BUN/Cr. MD to see. OK to stop heparin. Change Flagyl to po Vanc (coumadin interaction).   Corine Shelter PA-C 05/09/2011, 10:46 AM    Agree with note written by Corine Shelter PAC  Pt with HOCM and low nl LV fxn. AFIB with RVR. Meds  being adjusted. INR thereputic on coumadin. Exam benign. Plan transfer to tele. Ambulate. Continue PO atbx for c.diff.  Runell Gess 05/09/2011 10:55 AM

## 2011-05-10 LAB — BASIC METABOLIC PANEL
GFR calc Af Amer: 43 mL/min — ABNORMAL LOW (ref >90)
GFR calc non Af Amer: 37 mL/min — ABNORMAL LOW (ref >90)
Potassium: 4.3 mEq/L (ref 3.5–5.1)
Sodium: 132 mEq/L — ABNORMAL LOW (ref 135–145)

## 2011-05-10 LAB — PROTIME-INR
INR: 2.11 — ABNORMAL HIGH (ref 0.00–1.49)
Prothrombin Time: 24 seconds — ABNORMAL HIGH (ref 11.6–15.2)

## 2011-05-10 MED ORDER — GUAIFENESIN ER 600 MG PO TB12: 600.0000 mg | ORAL_TABLET | Freq: Two times a day (BID) | ORAL | Status: AC

## 2011-05-10 MED ORDER — WARFARIN SODIUM 2.5 MG PO TABS: 2.5000 mg | ORAL_TABLET | Freq: Once | ORAL | Status: DC

## 2011-05-10 MED ORDER — METOPROLOL TARTRATE 100 MG PO TABS: 100.0000 mg | ORAL_TABLET | Freq: Two times a day (BID) | ORAL | Status: DC

## 2011-05-10 MED ORDER — AMIODARONE HCL 200 MG PO TABS: 200.0000 mg | ORAL_TABLET | Freq: Every day | ORAL | Status: DC

## 2011-05-10 MED ORDER — DILTIAZEM HCL ER COATED BEADS 180 MG PO CP24: 180.0000 mg | ORAL_CAPSULE | Freq: Every day | ORAL | Status: DC

## 2011-05-10 MED ORDER — FUROSEMIDE 40 MG PO TABS: 20.0000 mg | ORAL_TABLET | Freq: Every day | ORAL | Status: DC | PRN

## 2011-05-10 MED ORDER — POTASSIUM CHLORIDE CRYS ER 20 MEQ PO TBCR: 20.0000 meq | EXTENDED_RELEASE_TABLET | Freq: Every day | ORAL | Status: DC

## 2011-05-10 MED ORDER — WARFARIN SODIUM 2.5 MG PO TABS
2.5000 mg | ORAL_TABLET | Freq: Once | ORAL | Status: DC
  Filled 2011-05-10: qty 1

## 2011-05-10 NOTE — Progress Notes (Addendum)
ANTICOAGULATION CONSULT NOTE - Follow Up Consult  Pharmacy Consult for coumadin Indication: atrial fibrillation  Allergies  Allergen Reactions  . Metoprolol Other (See Comments)    Bradycardia  . Codeine Nausea Only    And dizziness  . Seldane (Terfenadine) Nausea Only    And dizziness  . Statins     myalgia    Patient Measurements: Height: 5' 3.5" (161.3 cm) Weight: 184 lb 3.2 oz (83.553 kg) IBW/kg (Calculated) : 53.55  Adjusted Body Weight:   Vital Signs: Temp: 97.4 F (36.3 C) (11/18 0454) Temp src: Oral (11/18 0454) BP: 141/83 mmHg (11/18 0454) Pulse Rate: 104  (11/18 0454)  Labs:  Basename 05/10/11 1610 05/09/11 0610 05/08/11 0435  HGB -- 11.4* 11.1*  HCT -- 36.0 34.3*  PLT -- 382 357  APTT -- -- --  LABPROT 24.0* 24.6* 22.5*  INR 2.11* 2.18* 1.94*  HEPARINUNFRC -- 0.61 0.37  CREATININE 1.30* 1.45* 1.64*  CKTOTAL -- -- --  CKMB -- -- --  TROPONINI -- -- --   Estimated Creatinine Clearance: 35.1 ml/min (by C-G formula based on Cr of 1.3).   Medications:  Scheduled:    . amiodarone  200 mg Oral Daily  . aspirin  81 mg Oral Daily  . calcium-vitamin D  1 tablet Oral Daily  . diltiazem  180 mg Oral Daily  . furosemide  20 mg Oral Daily  . guaiFENesin  600 mg Oral BID  . levothyroxine  100 mcg Oral Daily  . loratadine  10 mg Oral Daily  . magnesium oxide  400 mg Oral BID  . metoprolol tartrate  100 mg Oral BID  . omega-3 acid ethyl esters  1 g Oral BID  . potassium chloride  20 mEq Oral Daily  . warfarin  2.5 mg Oral ONCE-1800    Assessment: 77yo female with afib is currently on therapeutic coumadin. On amio.  Goal of Therapy:  INR 2-3   Plan:  1) Coumadin 2.5mg  po x1 before d/c today 2) Advise patient not to take any coumadin tonight if received dose before d/c  Emmy Keng, Tsz-Yin 05/10/2011,12:27 PM

## 2011-05-10 NOTE — Progress Notes (Signed)
  Subjective:  Up without problems  Objective:  Vital Signs in the last 24 hours: Temp:  [97.4 F (36.3 C)-98 F (36.7 C)] 97.4 F (36.3 C) (11/18 0454) Pulse Rate:  [99-117] 104  (11/18 0454) Resp:  [18-23] 18  (11/18 0454) BP: (129-141)/(76-90) 141/83 mmHg (11/18 0454) SpO2:  [92 %-96 %] 92 % (11/18 0454) Weight:  [83.553 kg (184 lb 3.2 oz)] 184 lb 3.2 oz (83.553 kg) (11/18 0626)  Intake/Output from previous day: 11/17 0701 - 11/18 0700 In: 480 [P.O.:480] Out: -   Physical Exam: General appearance: alert, cooperative and no distress Lungs: clear to auscultation bilaterally Heart: irregularly irregular rhythm   Rate: 90-110  Rhythm: atrial fibrillation  Lab Results:  Basename 05/09/11 0610 05/08/11 0435  WBC 7.6 8.7  HGB 11.4* 11.1*  PLT 382 357    Basename 05/10/11 0633 05/09/11 0610  NA 132* 131*  K 4.3 3.5  CL 94* 92*  CO2 28 27  GLUCOSE 134* 112*  BUN 29* 30*  CREATININE 1.30* 1.45*   No results found for this basename: TROPONINI:2,CK,MB:2 in the last 72 hours Hepatic Function Panel No results found for this basename: PROT,ALBUMIN,AST,ALT,ALKPHOS,BILITOT,BILIDIR,IBILI in the last 72 hours No results found for this basename: CHOL in the last 72 hours  Basename 05/10/11 0633  INR 2.11*    Imaging: No results found.  Cardiac Studies:  Assessment/Plan:   Principal Problem:  *Atrial fibrillation with rapid ventricular response, improved Active Problems:  Hypertrophic obstructive cardiomyopathy with diastolic heart failure  Acute on chronic diastolic heart failure  Anticoagulation monitoring, INR range 2-3  Hypertension  Clostridium difficile infection  Mitral insufficiency due to HOCM and ruptured chorda tendina  Plan- home today, resume lasix at decreased dose.   Corine Shelter PA-C 05/10/2011, 9:08 AM Agree with note written by Corine Shelter [facility]  INR theraputic 2.11. HR under better control after adjusting meds (100). Feels ok. Exam benign.  OK to D/C. Home. INR in our [place] office on Wed then ROV with AL 1-2 weeks.  Runell Gess 05/10/2011 9:22 AM

## 2011-05-10 NOTE — Discharge Summary (Signed)
Patient ID: Carla Black,  MRN: [id number], DOB/AGE: Dec 23, 1927 77 y.o.  Admit date: 04/27/2011 Discharge date: 05/10/2011  Primary Care Provider: Dr Merla Riches  Primary Cardiologist: Dr Clarene Duke  Discharge Diagnoses Principal Problem:  *Atrial fibrillation with rapid ventricular response Active Problems:  Hypertrophic obstructive cardiomyopathy with diastolic heart failure  Acute on chronic diastolic heart failure  Anticoagulation monitoring, INR range 2-3  Hypertension  Clostridium difficile infection  Mitral insufficiency due to HOCM and ruptured chorda tendina    ProceduresTEE Cardioversion 05/01/2011   Hospital Course:   77y/o with a history of HOCM admitted with SOB and found to be in rapid AF, new for her.  She had been seen as an out patient by her primary MD and put on antibiotics for possible pneumonia a few days prior to this admission.  The patient was admitted to step down and put on heparin/ coumadin and Cardizem.  She has a history of beta blocker intolerance in the past secondary to bradycardia so this was added slowly at first. She underwent TEE DCCV on 05/01/11 to NSR but failed to hold.  We added Amiodarone and increasing doses of beta blocker to try and achieve rate control.  She did actually go into to NSR on 05/05/2011 but was back in AF by 11/14.  During this admission she also was treated for C.Difficele after positive screen.  By the 18th her rate was under better control and her INR is therapeutic.  She will have follow up INR later this week and see Dr Clarene Duke in 2 weeks.   Discharge Vitals:  Blood pressure 141/83, pulse 104, temperature 97.4 F (36.3 C), temperature source Oral, resp. rate 18, height 5' 3.5" (1.613 m), weight 83.553 kg (184 lb 3.2 oz), SpO2 92.00%.     Discharge Medications:  Discharge Medication List as of 05/10/2011  2:22 PM    START taking these medications   Details  amiodarone (PACERONE) 200 MG tablet Take 1 tablet (200 mg  total) by mouth daily., Starting 05/10/2011, Until Mon 05/09/12, Print    diltiazem (CARDIZEM CD) 180 MG 24 hr capsule Take 1 capsule (180 mg total) by mouth daily., Starting 05/10/2011, Until Mon 05/09/12, Print    guaiFENesin (MUCINEX) 600 MG 12 hr tablet Take 1 tablet (600 mg total) by mouth 2 (two) times daily., Starting 05/10/2011, Until Mon 05/09/12, OTC    metoprolol (LOPRESSOR) 100 MG tablet Take 1 tablet (100 mg total) by mouth 2 (two) times daily., Starting 05/10/2011, Until Mon 05/09/12, Print    warfarin (COUMADIN) 2.5 MG tablet Take 1 tablet (2.5 mg total) by mouth once., Starting 05/10/2011, Until Mon 05/09/12, Print      CONTINUE these medications which have CHANGED   Details  furosemide (LASIX) 40 MG tablet Take 0.5 tablets (20 mg total) by mouth daily as needed. For fluid retention, Starting 05/10/2011, Until Discontinued, No Print    potassium chloride SA (K-DUR,KLOR-CON) 20 MEQ tablet Take 1 tablet (20 mEq total) by mouth daily. When taking furosemide, Starting 05/10/2011, Until Discontinued, No Print      CONTINUE these medications which have NOT CHANGED   Details  aspirin 81 MG tablet Take 81 mg by mouth daily. , Until Discontinued, Historical Med    levothyroxine (SYNTHROID, LEVOTHROID) 100 MCG tablet Take 100 mcg by mouth daily. , Until Discontinued, Historical Med    Multiple Vitamins-Minerals (ICAPS AREDS FORMULA PO) Take by mouth 2 (two) times daily. , Until Discontinued, Historical Med    Omega 3-6-9 Fatty Acids (OMEGA  3-6-9 COMPLEX PO) Take 1 tablet by mouth 2 (two) times daily.  , Until Discontinued, Historical Med    Probiotic Product (ALIGN PO) Take by mouth daily.  , Until Discontinued, Historical Med    nitroGLYCERIN (NITROSTAT) 0.4 MG SL tablet Place 0.4 mg under the tongue as needed.  , Until Discontinued, Historical Med      STOP taking these medications     clopidogrel (PLAVIX) 75 MG tablet      amoxicillin (AMOXIL) 500 MG capsule       diltiazem (TIAZAC) 240 MG 24 hr capsule         Outstanding Labs/Studies  INR 2.11  Duration of Discharge Encounter: Greater than 30 minutes including physician time.  Jolene Provost K NP 05/10/2011, 6:22 PM

## 2011-05-10 NOTE — Progress Notes (Signed)
Pt. Discharged 05/10/2011  2:22 PM Discharge instructions reviewed with patient/family. Patient/family verbalized understanding. All Rx's given. Questions answered as needed. Pt. Discharged to home with family/self. Taken off unit via W/C. Marigene Ehlers, RN

## 2011-05-15 ENCOUNTER — Encounter (HOSPITAL_COMMUNITY): Payer: Self-pay | Admitting: Cardiovascular Disease

## 2011-06-02 ENCOUNTER — Encounter: Payer: Self-pay | Admitting: Internal Medicine

## 2011-06-02 DIAGNOSIS — E039 Hypothyroidism, unspecified: Secondary | ICD-10-CM | POA: Insufficient documentation

## 2011-06-12 ENCOUNTER — Encounter (HOSPITAL_COMMUNITY): Payer: Self-pay | Admitting: Pharmacy Technician

## 2011-06-12 ENCOUNTER — Other Ambulatory Visit: Payer: Self-pay | Admitting: Cardiovascular Disease

## 2011-06-18 ENCOUNTER — Other Ambulatory Visit: Payer: Self-pay | Admitting: Cardiovascular Disease

## 2011-06-24 ENCOUNTER — Encounter: Payer: Self-pay | Admitting: Internal Medicine

## 2011-06-30 ENCOUNTER — Other Ambulatory Visit: Payer: Self-pay

## 2011-06-30 ENCOUNTER — Ambulatory Visit (HOSPITAL_COMMUNITY)
Admission: RE | Admit: 2011-06-30 | Discharge: 2011-06-30 | Disposition: A | Discharge status: Home / Self Care | Payer: Medicare Other | Source: Ambulatory Visit | Attending: Cardiovascular Disease | Admitting: Cardiovascular Disease

## 2011-06-30 ENCOUNTER — Encounter (HOSPITAL_COMMUNITY)
Admission: RE | Disposition: A | Discharge status: Home / Self Care | Payer: Self-pay | Source: Ambulatory Visit | Attending: Cardiovascular Disease

## 2011-06-30 DIAGNOSIS — Z5309 Procedure and treatment not carried out because of other contraindication: Secondary | ICD-10-CM | POA: Insufficient documentation

## 2011-06-30 DIAGNOSIS — I4891 Unspecified atrial fibrillation: Secondary | ICD-10-CM | POA: Insufficient documentation

## 2011-06-30 HISTORY — PX: CARDIOVERSION: SHX1299

## 2011-06-30 LAB — PROTIME-INR: INR: 1.99 — ABNORMAL HIGH (ref 0.00–1.49)

## 2011-06-30 SURGERY — CARDIOVERSION
Anesthesia: Monitor Anesthesia Care | Wound class: Clean

## 2011-06-30 MED ORDER — HYDROCORTISONE 1 % EX CREA
1.0000 "application " | TOPICAL_CREAM | Freq: Three times a day (TID) | CUTANEOUS | Status: DC | PRN
  Filled 2011-06-30: qty 28

## 2011-06-30 MED ORDER — SODIUM CHLORIDE 0.9 % IV SOLN: INTRAVENOUS | Status: DC

## 2011-06-30 MED ORDER — SODIUM CHLORIDE 0.9 % IJ SOLN: 3.0000 mL | INTRAMUSCULAR | Status: DC | PRN

## 2011-06-30 NOTE — H&P (Signed)
[employer], [place] Female, 78 y.o., 1927/12/25  MRN: [id number]  History reviewed, patient examined ECG on arrival shows NSR. Cardioversion canceled. Thurmon Fair, MD, [facility] [phone] 06/30/2011.11:12 AM

## 2011-07-01 ENCOUNTER — Encounter (HOSPITAL_COMMUNITY): Payer: Self-pay | Admitting: *Deleted

## 2011-07-01 ENCOUNTER — Encounter (HOSPITAL_COMMUNITY): Payer: Self-pay | Admitting: Cardiovascular Disease

## 2011-07-22 ENCOUNTER — Ambulatory Visit (INDEPENDENT_AMBULATORY_CARE_PROVIDER_SITE_OTHER): Payer: Medicare Other | Admitting: Internal Medicine

## 2011-07-22 ENCOUNTER — Encounter: Payer: Self-pay | Admitting: Internal Medicine

## 2011-07-22 DIAGNOSIS — I482 Chronic atrial fibrillation, unspecified: Secondary | ICD-10-CM

## 2011-07-22 DIAGNOSIS — R42 Dizziness and giddiness: Secondary | ICD-10-CM

## 2011-07-22 DIAGNOSIS — H6120 Impacted cerumen, unspecified ear: Secondary | ICD-10-CM

## 2011-07-22 DIAGNOSIS — E039 Hypothyroidism, unspecified: Secondary | ICD-10-CM

## 2011-07-22 DIAGNOSIS — R109 Unspecified abdominal pain: Secondary | ICD-10-CM

## 2011-07-22 DIAGNOSIS — I10 Essential (primary) hypertension: Secondary | ICD-10-CM

## 2011-07-22 LAB — CBC WITH DIFFERENTIAL/PLATELET
Basophils Absolute: 0.1 10*3/uL (ref 0.0–0.1)
HCT: 43.3 % (ref 36.0–46.0)
Lymphocytes Relative: 40 % (ref 12–46)
Monocytes Absolute: 0.7 10*3/uL (ref 0.1–1.0)
Neutro Abs: 3 10*3/uL (ref 1.7–7.7)
RDW: 16.7 % — ABNORMAL HIGH (ref 11.5–15.5)
WBC: 6.6 10*3/uL (ref 4.0–10.5)

## 2011-07-22 LAB — PROTIME-INR
INR: 1.82 — ABNORMAL HIGH (ref <1.50)
Prothrombin Time: 21.7 seconds — ABNORMAL HIGH (ref 11.6–15.2)

## 2011-07-22 NOTE — Progress Notes (Signed)
Bilateral ear irrigation

## 2011-07-22 NOTE — Progress Notes (Signed)
Ms. Sandridge presents today with several problems She has been relatively stable since her right was controlled and the atrial fibrilatrial fibrillation converted to normal rhythm. Strike Demerol.she has recently been discharged after one month in the hospital. She was admitted in congestive heart failure due to atrial fibrillation brought on by pneumonia. It was difficult to control in the hospital. She was discharged on the medicines of Coumadin and amiodaronel and as well as a change to Cardizem. TOday she needs laboratories to check herpotassium level and her lipid profile.  She reports being very tired.she is often lightheaded lightheaded as gets up in am..then ok sitting but turning quickly or rushing brings it back. she did experience one Dizzy episode needing andtivert lastweek..-once iin last 2 yrs. This current lightheadedness is not the same as her vertigo. It does make her afraid to drive. She has had no palpitations or syncope when lightheaded. It is time to check her TSH. She missed her PT/INR last week due to the snow and we will repeat that today.  She reports a new complaint that is alarming. She has intermittent abdominal pain in both lower quadrants. This is primarily on the right.  She did have a bout of C. Difficile while in the hospital in November. She has been on probiotics ever since Itsometimes occurs on the left and moved to the right. It usually lasts 24 hours and he is bad enough that she cannot do anything. There is no associated vomiting but she does occasionally have diarrhea with. This started one month ago with a bad bout of diarrhea. There was no blood in the diarrhea. She had a routine colonoscopy in 2010 which was normal. She reports no night sweats or fever. She is not losing weight. There are no GU complaints. She has had an appendectomy. She has had a hernia repair.  Past surgeries also include a removal of thymoma, a left carotid endarterectomy in 2000, a thyroidectomy  in 1991 and cardiac stents in 2009.during her recent hospitalization she was found to have hypertrophic obstructive cardiomyopathy.  Physical examination  HEENT-her exam is normal without signs of nystagmus or unequal pupils. She does have bilateral cerumen impaction.  Cardiovascular. She has arranged a 70 with a grade 2 systolic murmur over the outflow tract. Her rhythm is regular  Pulmonary-her lungs are clear  Gastrointestinal-she is tender in both lower quadrants with no rebound tenderness and no masses. There is no hepatomegaly. The tenderness is mild today and she is currently not in her typical pain.  Extremities. She has 1+ lower extremity edema which is usual for her.  Neuro-she is intact and in fact very intact for someone her age.  Assessment-the most urgent problem is her abdominal pain and she will be scheduled for CT of the abdomen and pelvis with contrast to rule out diverticulitis or possible intermittent obstructive disorders. She is sent home with a Hemoccult test. We will obtain a CBC and metabolic profile  We will check a PT/INR and lipid profile and she will followup with her cardiologist next week for hypertension, atrial fibrillation, congestive heart failure, Coumadin therapy, and cardiomyopathy.  For now we will defer further evaluation of her dizziness and her fatigue.  Her ears were irrigated and partially cleared. She will use Cortisporin otic drops once a day or twice a day for the next 2 weeks.

## 2011-07-23 LAB — COMPREHENSIVE METABOLIC PANEL
BUN: 24 mg/dL — ABNORMAL HIGH (ref 6–23)
CO2: 28 mEq/L (ref 19–32)
Calcium: 9 mg/dL (ref 8.4–10.5)
Creat: 1.1 mg/dL (ref 0.50–1.10)
Potassium: 4.6 mEq/L (ref 3.5–5.3)
Sodium: 141 mEq/L (ref 135–145)
Total Bilirubin: 0.5 mg/dL (ref 0.3–1.2)

## 2011-07-26 LAB — HEMOCCULT GUIAC POC 1CARD (OFFICE): Fecal Occult Blood, POC: NEGATIVE

## 2011-08-10 ENCOUNTER — Ambulatory Visit
Admission: RE | Admit: 2011-08-10 | Discharge: 2011-08-10 | Disposition: A | Discharge status: Home / Self Care | Payer: Medicare Other | Source: Ambulatory Visit | Attending: Internal Medicine | Admitting: Internal Medicine

## 2011-08-10 MED ORDER — IOHEXOL 300 MG/ML  SOLN
100.0000 mL | Freq: Once | INTRAMUSCULAR | Status: AC | PRN
  Administered 2011-08-10: 100 mL via INTRAVENOUS

## 2011-08-16 ENCOUNTER — Telehealth: Payer: Self-pay

## 2011-08-16 NOTE — Telephone Encounter (Signed)
.  [facility] Pt called regarding the results of the CT scan done 08/10/11 that was ordered by Dr. Merla Riches.  Patient would like Dr. Merla Riches to review the scans and call her with the results.  Please call the patient with the results at [phone].

## 2011-08-16 NOTE — Telephone Encounter (Signed)
Dr. Merla Riches, Pls review CT scan and advise. Thanks

## 2011-08-18 NOTE — Telephone Encounter (Signed)
Discussed CT results Stable now-if recurs = ? Hernia vs divertic vs other//may need GI  Productive cough continues but ?post nasal dr-to use saline

## 2011-09-10 ENCOUNTER — Other Ambulatory Visit: Payer: Self-pay | Admitting: Internal Medicine

## 2011-09-14 ENCOUNTER — Telehealth: Payer: Self-pay | Admitting: *Deleted

## 2011-09-14 MED ORDER — LEVOTHYROXINE SODIUM 100 MCG PO TABS: 100.0000 ug | ORAL_TABLET | Freq: Every day | ORAL | Status: DC

## 2011-09-14 NOTE — Telephone Encounter (Signed)
This pt called and wanted to know why her rx for thyroid was only filled for 1 month.  Advised her that they said she needed an appt.  Pt was seen on 1/30 and had blood work done.  She would like to know if we can write a script for 90 with 40yr and send it to her pharmacy

## 2011-09-14 NOTE — Telephone Encounter (Signed)
Did pt requests to pharmacy.  Sorry for our mistake.

## 2011-09-14 NOTE — Telephone Encounter (Signed)
Informed pt that rx request had been done.

## 2011-09-21 ENCOUNTER — Ambulatory Visit (INDEPENDENT_AMBULATORY_CARE_PROVIDER_SITE_OTHER): Payer: Medicare Other | Admitting: *Deleted

## 2011-09-21 DIAGNOSIS — I6529 Occlusion and stenosis of unspecified carotid artery: Secondary | ICD-10-CM

## 2011-09-21 DIAGNOSIS — I779 Disorder of arteries and arterioles, unspecified: Secondary | ICD-10-CM

## 2011-09-21 HISTORY — DX: Disorder of arteries and arterioles, unspecified: I77.9

## 2011-09-30 ENCOUNTER — Other Ambulatory Visit: Payer: Self-pay | Admitting: *Deleted

## 2011-09-30 DIAGNOSIS — Z48812 Encounter for surgical aftercare following surgery on the circulatory system: Secondary | ICD-10-CM

## 2011-09-30 DIAGNOSIS — I6529 Occlusion and stenosis of unspecified carotid artery: Secondary | ICD-10-CM

## 2011-09-30 NOTE — Procedures (Unsigned)
CAROTID DUPLEX EXAM  INDICATION:  Followup carotid disease.  HISTORY: Diabetes:  No Cardiac:  Yes Hypertension:  Yes Smoking:  No Previous Surgery:  Left CEA 10/01/1998 CV History: Amaurosis Fugax No, Paresthesias No, Hemiparesis No                                      RIGHT                LEFT Brachial systolic pressure:         120                  120 Brachial Doppler waveforms:         WNL                  WNL Vertebral direction of flow:        Antegrade            Antegrade DUPLEX VELOCITIES (cm/sec) CCA peak systolic                   72                   75 ECA peak systolic                   305                  181 ICA peak systolic                   272                  94 ICA end diastolic                   41                   13 PLAQUE MORPHOLOGY:                  Heterogeneous/calcific                 Heterogeneous PLAQUE AMOUNT:                      Moderate             Mild PLAQUE LOCATION:                    CCA/ECA/ICA          CEA/ECA  IMPRESSION: 1. 40%-59% right internal carotid artery stenosis. 2. Patent left carotid endarterectomy with minimal restenosis     observed. 3. Bilateral external carotid artery stenosis right worse than left. 4. Bilateral vertebral arteries are antegrade.  ___________________________________________ Di Kindle. Edilia Bo, M.D.  LT/MEDQ  D:  09/21/2011  T:  09/21/2011  Job:  [id number]

## 2011-10-01 ENCOUNTER — Encounter: Payer: Self-pay | Admitting: Vascular Surgery

## 2011-11-19 ENCOUNTER — Ambulatory Visit: Payer: Medicare Other

## 2011-11-19 ENCOUNTER — Ambulatory Visit (INDEPENDENT_AMBULATORY_CARE_PROVIDER_SITE_OTHER): Payer: Medicare Other | Admitting: Family Medicine

## 2011-11-19 VITALS — BP 196/73 | HR 51 | Temp 97.7°F | Resp 18 | Ht 63.5 in | Wt 196.0 lb

## 2011-11-19 DIAGNOSIS — I509 Heart failure, unspecified: Secondary | ICD-10-CM

## 2011-11-19 DIAGNOSIS — M25571 Pain in right ankle and joints of right foot: Secondary | ICD-10-CM

## 2011-11-19 DIAGNOSIS — J45990 Exercise induced bronchospasm: Secondary | ICD-10-CM

## 2011-11-19 DIAGNOSIS — M25579 Pain in unspecified ankle and joints of unspecified foot: Secondary | ICD-10-CM

## 2011-11-19 LAB — POCT CBC
Granulocyte percent: 55.1 %G (ref 37–80)
MCV: 95.6 fL (ref 80–97)
MID (cbc): 0.7 (ref 0–0.9)
MPV: 9.4 fL (ref 0–99.8)
POC Granulocyte: 4.1 (ref 2–6.9)
POC LYMPH PERCENT: 35.4 %L (ref 10–50)
POC MID %: 9.5 %M (ref 0–12)
Platelet Count, POC: 228 10*3/uL (ref 142–424)
RBC: 4.48 M/uL (ref 4.04–5.48)
RDW, POC: 17.9 %

## 2011-11-19 MED ORDER — ALBUTEROL SULFATE HFA 108 (90 BASE) MCG/ACT IN AERS: 2.0000 | INHALATION_SPRAY | Freq: Four times a day (QID) | RESPIRATORY_TRACT | Status: DC | PRN

## 2011-11-19 NOTE — Progress Notes (Signed)
Subjective: Last August the patient had fasciitis of her right  heel, which was injected by a podiatrist.   2 weeks ago she turned her right ankle. It got black and blue. She had most pain laterally below the malleolus and anterior to it. The ecchymosis has resolved, but it persisted in being swollen and she has a erythematous area on the front aspect of the ankle above the joint area. She was concerned about whether she is getting a cellulitis in there.    She gets short of breath on ambulation. She used to have a tissue take couple of coughs with every 4-6 hours when needed, and this helped a lot. She is out of that. She sees a cardiologist for her heart, and Dr. Clarene Duke is getting ready to retire and she'll be moving on to a different physician.  Objective: 2+ pitting edema of the ankles. Toes are swollen, but apparently the left foot is someone that is usually swollen and the right one is not typically swollen. She has an area of erythema on the low shin, about 5 x 9 cm. She is tender anterior to and at and below the lateral malleolus. It hurts to evertt the ankle.  Her chest is initially clear to auscultation, but on forced expiration there is definitely a wheeze.   Assessment: Ankle sprain with residual edema and erythema Exertional asthma  Plan:  X-ray and CBC Results for orders placed in visit on 11/19/11  POCT CBC      Component Value Range   WBC 7.5  4.6 - 10.2 (K/uL)   Lymph, poc 2.7  0.6 - 3.4    POC LYMPH PERCENT 35.4  10 - 50 (%L)   MID (cbc) 0.7  0 - 0.9    POC MID % 9.5  0 - 12 (%M)   POC Granulocyte 4.1  2 - 6.9    Granulocyte percent 55.1  37 - 80 (%G)   RBC 4.48  4.04 - 5.48 (M/uL)   Hemoglobin 13.6  12.2 - 16.2 (g/dL)   HCT, POC 40.9  81.1 - 47.9 (%)   MCV 95.6  80 - 97 (fL)   MCH, POC 30.4  27 - 31.2 (pg)   MCHC 31.8  31.8 - 35.4 (g/dL)   RDW, POC 91.4     Platelet Count, POC 228  142 - 424 (K/uL)   MPV 9.4  0 - 99.8 (fL)   [facility] reading (PRIMARY) by  Dr.  Alwyn Ren Negative  Will prescribe her an albuterol inhaler for the wheezing. Also give her a ankles Swede-O for the persistent sprain and secondary inflammation.Marland Kitchen

## 2011-11-19 NOTE — Patient Instructions (Signed)
Keep foot elevated.  When you are ambulating try to wear the splint as much of the time as possible.  Use the inhaler on an as needed basis only, not for regular use.

## 2011-11-24 ENCOUNTER — Ambulatory Visit (INDEPENDENT_AMBULATORY_CARE_PROVIDER_SITE_OTHER): Payer: Medicare Other | Admitting: Internal Medicine

## 2011-11-24 ENCOUNTER — Ambulatory Visit: Payer: Medicare Other

## 2011-11-24 ENCOUNTER — Inpatient Hospital Stay (HOSPITAL_COMMUNITY)
Admission: AD | Admit: 2011-11-24 | Discharge: 2011-11-28 | DRG: 193 | Disposition: A | Discharge status: Home / Self Care | Payer: Medicare Other | Source: Ambulatory Visit | Attending: Family Medicine | Admitting: Family Medicine

## 2011-11-24 ENCOUNTER — Encounter (HOSPITAL_COMMUNITY): Payer: Self-pay | Admitting: General Practice

## 2011-11-24 VITALS — BP 157/64 | HR 61 | Resp 18

## 2011-11-24 DIAGNOSIS — I5023 Acute on chronic systolic (congestive) heart failure: Secondary | ICD-10-CM

## 2011-11-24 DIAGNOSIS — I340 Nonrheumatic mitral (valve) insufficiency: Secondary | ICD-10-CM | POA: Diagnosis present

## 2011-11-24 DIAGNOSIS — Z7901 Long term (current) use of anticoagulants: Secondary | ICD-10-CM

## 2011-11-24 DIAGNOSIS — I4891 Unspecified atrial fibrillation: Secondary | ICD-10-CM | POA: Diagnosis present

## 2011-11-24 DIAGNOSIS — E039 Hypothyroidism, unspecified: Secondary | ICD-10-CM

## 2011-11-24 DIAGNOSIS — I5033 Acute on chronic diastolic (congestive) heart failure: Secondary | ICD-10-CM | POA: Diagnosis present

## 2011-11-24 DIAGNOSIS — I5030 Unspecified diastolic (congestive) heart failure: Secondary | ICD-10-CM | POA: Diagnosis present

## 2011-11-24 DIAGNOSIS — N183 Chronic kidney disease, stage 3 unspecified: Secondary | ICD-10-CM | POA: Diagnosis present

## 2011-11-24 DIAGNOSIS — I2510 Atherosclerotic heart disease of native coronary artery without angina pectoris: Secondary | ICD-10-CM | POA: Diagnosis present

## 2011-11-24 DIAGNOSIS — I421 Obstructive hypertrophic cardiomyopathy: Secondary | ICD-10-CM | POA: Diagnosis present

## 2011-11-24 DIAGNOSIS — J159 Unspecified bacterial pneumonia: Secondary | ICD-10-CM

## 2011-11-24 DIAGNOSIS — I10 Essential (primary) hypertension: Secondary | ICD-10-CM | POA: Diagnosis present

## 2011-11-24 DIAGNOSIS — I5032 Chronic diastolic (congestive) heart failure: Secondary | ICD-10-CM | POA: Diagnosis present

## 2011-11-24 DIAGNOSIS — I509 Heart failure, unspecified: Secondary | ICD-10-CM | POA: Diagnosis present

## 2011-11-24 DIAGNOSIS — J45990 Exercise induced bronchospasm: Secondary | ICD-10-CM

## 2011-11-24 DIAGNOSIS — R05 Cough: Secondary | ICD-10-CM

## 2011-11-24 DIAGNOSIS — R0602 Shortness of breath: Secondary | ICD-10-CM

## 2011-11-24 DIAGNOSIS — R0609 Other forms of dyspnea: Secondary | ICD-10-CM

## 2011-11-24 DIAGNOSIS — K219 Gastro-esophageal reflux disease without esophagitis: Secondary | ICD-10-CM | POA: Diagnosis present

## 2011-11-24 DIAGNOSIS — I059 Rheumatic mitral valve disease, unspecified: Secondary | ICD-10-CM | POA: Diagnosis present

## 2011-11-24 DIAGNOSIS — I422 Other hypertrophic cardiomyopathy: Secondary | ICD-10-CM | POA: Diagnosis present

## 2011-11-24 DIAGNOSIS — J189 Pneumonia, unspecified organism: Secondary | ICD-10-CM | POA: Diagnosis present

## 2011-11-24 DIAGNOSIS — E785 Hyperlipidemia, unspecified: Secondary | ICD-10-CM | POA: Diagnosis present

## 2011-11-24 HISTORY — DX: Cardiac arrhythmia, unspecified: I49.9

## 2011-11-24 HISTORY — DX: Cerebral infarction, unspecified: I63.9

## 2011-11-24 LAB — COMPREHENSIVE METABOLIC PANEL
ALT: 20 U/L (ref 0–35)
Alkaline Phosphatase: 92 U/L (ref 39–117)
BUN: 25 mg/dL — ABNORMAL HIGH (ref 6–23)
CO2: 29 mEq/L (ref 19–32)
Chloride: 100 mEq/L (ref 96–112)
GFR calc Af Amer: 45 mL/min — ABNORMAL LOW (ref >90)
Glucose, Bld: 148 mg/dL — ABNORMAL HIGH (ref 70–99)
Potassium: 4.2 mEq/L (ref 3.5–5.1)
Sodium: 140 mEq/L (ref 135–145)
Total Bilirubin: 0.6 mg/dL (ref 0.3–1.2)
Total Protein: 7.8 g/dL (ref 6.0–8.3)

## 2011-11-24 LAB — POCT CBC
Lymph, poc: 1.6 (ref 0.6–3.4)
MCH, POC: 30 pg (ref 27–31.2)
MCHC: 31.3 g/dL — AB (ref 31.8–35.4)
MID (cbc): 0.9 (ref 0–0.9)
MPV: 9.4 fL (ref 0–99.8)
POC LYMPH PERCENT: 11.8 %L (ref 10–50)
POC MID %: 6.7 %M (ref 0–12)
Platelet Count, POC: 215 10*3/uL (ref 142–424)
RBC: 4.26 M/uL (ref 4.04–5.48)
RDW, POC: 16.5 %
WBC: 13.4 10*3/uL — AB (ref 4.6–10.2)

## 2011-11-24 LAB — PRO B NATRIURETIC PEPTIDE: Pro B Natriuretic peptide (BNP): 13052 pg/mL — ABNORMAL HIGH (ref 0–450)

## 2011-11-24 MED ORDER — FUROSEMIDE 10 MG/ML IJ SOLN
40.0000 mg | Freq: Once | INTRAMUSCULAR | Status: DC
  Filled 2011-11-24: qty 4

## 2011-11-24 MED ORDER — DEXTROSE 5 % IV SOLN
1.0000 g | INTRAVENOUS | Status: DC
  Administered 2011-11-24 – 2011-11-26 (×3): 1 g via INTRAVENOUS
  Filled 2011-11-24 (×4): qty 10

## 2011-11-24 MED ORDER — LISINOPRIL 20 MG PO TABS
20.0000 mg | ORAL_TABLET | Freq: Every day | ORAL | Status: DC
  Administered 2011-11-25 – 2011-11-28 (×4): 20 mg via ORAL
  Filled 2011-11-24 (×4): qty 1

## 2011-11-24 MED ORDER — WARFARIN - PHYSICIAN DOSING INPATIENT: Freq: Every day | Status: DC

## 2011-11-24 MED ORDER — AZITHROMYCIN 500 MG PO TABS
500.0000 mg | ORAL_TABLET | Freq: Every day | ORAL | Status: AC
  Administered 2011-11-24: 500 mg via ORAL
  Filled 2011-11-24: qty 1

## 2011-11-24 MED ORDER — HEPARIN SODIUM (PORCINE) 5000 UNIT/ML IJ SOLN
5000.0000 [IU] | Freq: Three times a day (TID) | INTRAMUSCULAR | Status: DC
  Filled 2011-11-24 (×2): qty 1

## 2011-11-24 MED ORDER — QUINAPRIL HCL 10 MG PO TABS: 20.0000 mg | ORAL_TABLET | Freq: Every day | ORAL | Status: DC

## 2011-11-24 MED ORDER — LEVOTHYROXINE SODIUM 100 MCG PO TABS
100.0000 ug | ORAL_TABLET | Freq: Every day | ORAL | Status: DC
  Administered 2011-11-25 – 2011-11-28 (×4): 100 ug via ORAL
  Filled 2011-11-24 (×5): qty 1

## 2011-11-24 MED ORDER — SODIUM CHLORIDE 0.9 % IJ SOLN: 3.0000 mL | Freq: Two times a day (BID) | INTRAMUSCULAR | Status: DC

## 2011-11-24 MED ORDER — ASPIRIN EC 81 MG PO TBEC
81.0000 mg | DELAYED_RELEASE_TABLET | Freq: Every day | ORAL | Status: DC
  Administered 2011-11-25 – 2011-11-28 (×4): 81 mg via ORAL
  Filled 2011-11-24 (×4): qty 1

## 2011-11-24 MED ORDER — AZITHROMYCIN 250 MG PO TABS
250.0000 mg | ORAL_TABLET | Freq: Every day | ORAL | Status: AC
  Administered 2011-11-25 – 2011-11-28 (×4): 250 mg via ORAL
  Filled 2011-11-24 (×4): qty 1

## 2011-11-24 MED ORDER — METOPROLOL TARTRATE 50 MG PO TABS
150.0000 mg | ORAL_TABLET | Freq: Two times a day (BID) | ORAL | Status: DC
  Administered 2011-11-24 – 2011-11-28 (×8): 150 mg via ORAL
  Filled 2011-11-24 (×9): qty 1

## 2011-11-24 MED ORDER — SODIUM CHLORIDE 0.9 % IJ SOLN: 3.0000 mL | INTRAMUSCULAR | Status: DC | PRN

## 2011-11-24 MED ORDER — DOXYCYCLINE HYCLATE 100 MG PO TABS
100.0000 mg | ORAL_TABLET | Freq: Two times a day (BID) | ORAL | Status: DC
  Filled 2011-11-24: qty 1

## 2011-11-24 MED ORDER — FUROSEMIDE 10 MG/ML IJ SOLN
20.0000 mg | Freq: Once | INTRAMUSCULAR | Status: AC
  Administered 2011-11-24: 20 mg via INTRAVENOUS
  Filled 2011-11-24: qty 2

## 2011-11-24 MED ORDER — BIOTENE DRY MOUTH MT LIQD
15.0000 mL | Freq: Two times a day (BID) | OROMUCOSAL | Status: DC
  Administered 2011-11-24 – 2011-11-27 (×2): 15 mL via OROMUCOSAL

## 2011-11-24 MED ORDER — SACCHAROMYCES BOULARDII 250 MG PO CAPS
250.0000 mg | ORAL_CAPSULE | Freq: Every day | ORAL | Status: DC
  Administered 2011-11-24 – 2011-11-28 (×5): 250 mg via ORAL
  Filled 2011-11-24 (×5): qty 1

## 2011-11-24 MED ORDER — SODIUM CHLORIDE 0.9 % IJ SOLN
3.0000 mL | Freq: Two times a day (BID) | INTRAMUSCULAR | Status: DC
  Administered 2011-11-24 – 2011-11-28 (×8): 3 mL via INTRAVENOUS

## 2011-11-24 MED ORDER — DILTIAZEM HCL ER COATED BEADS 180 MG PO CP24
180.0000 mg | ORAL_CAPSULE | Freq: Every day | ORAL | Status: DC
  Administered 2011-11-25 – 2011-11-28 (×4): 180 mg via ORAL
  Filled 2011-11-24 (×4): qty 1

## 2011-11-24 MED ORDER — ALBUTEROL SULFATE HFA 108 (90 BASE) MCG/ACT IN AERS
2.0000 | INHALATION_SPRAY | Freq: Four times a day (QID) | RESPIRATORY_TRACT | Status: DC | PRN
  Administered 2011-11-25 – 2011-11-26 (×3): 2 via RESPIRATORY_TRACT
  Filled 2011-11-24 (×2): qty 6.7

## 2011-11-24 MED ORDER — ASPIRIN 81 MG PO TABS: 81.0000 mg | ORAL_TABLET | Freq: Every day | ORAL | Status: DC

## 2011-11-24 MED ORDER — NITROGLYCERIN 0.4 MG SL SUBL: 0.4000 mg | SUBLINGUAL_TABLET | SUBLINGUAL | Status: DC | PRN

## 2011-11-24 MED ORDER — ALIGN 4 MG PO CAPS: 4.0000 mg | ORAL_CAPSULE | Freq: Every day | ORAL | Status: DC

## 2011-11-24 MED ORDER — POTASSIUM CHLORIDE CRYS ER 10 MEQ PO TBCR
10.0000 meq | EXTENDED_RELEASE_TABLET | Freq: Every day | ORAL | Status: DC
  Administered 2011-11-25 – 2011-11-28 (×4): 10 meq via ORAL
  Filled 2011-11-24 (×4): qty 1

## 2011-11-24 MED ORDER — SODIUM CHLORIDE 0.9 % IV SOLN: 250.0000 mL | INTRAVENOUS | Status: DC | PRN

## 2011-11-24 MED ORDER — GUAIFENESIN ER 600 MG PO TB12
1200.0000 mg | ORAL_TABLET | Freq: Two times a day (BID) | ORAL | Status: DC
  Administered 2011-11-24 – 2011-11-28 (×8): 1200 mg via ORAL
  Filled 2011-11-24 (×9): qty 2

## 2011-11-24 MED ORDER — WARFARIN SODIUM 2.5 MG PO TABS
2.5000 mg | ORAL_TABLET | Freq: Every day | ORAL | Status: DC
  Administered 2011-11-24 – 2011-11-25 (×2): 2.5 mg via ORAL
  Filled 2011-11-24 (×3): qty 1

## 2011-11-24 MED ORDER — AMIODARONE HCL 200 MG PO TABS
200.0000 mg | ORAL_TABLET | Freq: Every day | ORAL | Status: DC
  Administered 2011-11-25 – 2011-11-28 (×4): 200 mg via ORAL
  Filled 2011-11-24 (×4): qty 1

## 2011-11-24 NOTE — Consult Note (Signed)
Reason for Consult: dyspnea  Requesting Physician: Dr Merla Riches  HPI: This is a 78 y.o. female with a past medical history significant for CAD, last cath 9/12, plan for medical Rx, and PAF. She was put on Amio Nov 2012 after failed DCCV. She was found to be in NSR when she was brought in again in Jan for another attempt.   PMHx:  Past Medical History  Diagnosis Date  . Hypertension   . Hyperlipidemia   . Heart murmur   . GERD (gastroesophageal reflux disease)   . Enlarged heart   . Arthritis   . Joint pain     left knee  . Dizziness   . Chest tightness 02/09/2008  . Hiatal hernia   . CHF (congestive heart failure)   . Carotid artery occlusion   . Shortness of breath   . Angina   . Hyperthyroidism   . Cancer   . Coronary artery disease   . Pneumonia   . Hypertrophic cardiomyopathy     subvalvular gradient 45-mm  . Dysrhythmia     hx of atrial fibrilation  . Stroke     hx of TIA   Past Surgical History  Procedure Date  . Appendectomy     and tonsillectomy  . Abdominal hysterectomy   . Malignant melanoma removal 1961  . Abdominal surgery     benign tumor removed  . Vocal cord surgery     vocal cord node removed  . Thymus gland tumor resection 1991  . Cardiac catheterization     Stent CX 40% with nml ffr  . Tee without cardioversion 05/01/2011    Procedure: TRANSESOPHAGEAL ECHOCARDIOGRAM (TEE);  Surgeon: Thurmon Fair;  Location: [facility];  Service: Cardiovascular;  Laterality: N/A;  . Cardioversion 05/01/2011    Procedure: CARDIOVERSION;  Surgeon: Rachelle Hora Croitoru;  Location: [facility];  Service: Cardiovascular;  Laterality: N/A;  . Cardioversion 06/30/2011    Procedure: CARDIOVERSION;  Surgeon: Thurmon Fair, MD;  Location: [facility] OR;  Service: Cardiovascular;  Laterality: N/A;    FAMHx: Family History  Problem Relation Age of Onset  . Heart disease Mother   . Other Father     aneurysm  . Coronary artery disease Other   . Stroke Other     SOCHx:  reports that she quit smoking about 22 years ago. Her smoking use included Cigarettes. She has never used smokeless tobacco. She reports that she drinks about .6 ounces of alcohol per week. She reports that she does not use illicit drugs.  ALLERGIES: Allergies  Allergen Reactions  . Metoprolol Other (See Comments)    Bradycardia  . Codeine Nausea Only    And dizziness  . Seldane (Terfenadine) Nausea Only    And dizziness  . Statins     myalgia    ROS: Pertinent items are noted in HPI.  HOME MEDICATIONS: Prescriptions prior to admission  Medication Sig Dispense Refill  . albuterol (PROVENTIL HFA;VENTOLIN HFA) 108 (90 BASE) MCG/ACT inhaler Inhale 2 puffs into the lungs every 6 (six) hours as needed for wheezing.  1 Inhaler  3  . amiodarone (PACERONE) 200 MG tablet Take 200 mg by mouth every morning.       Marland Kitchen aspirin 81 MG tablet Take 81 mg by mouth daily.       Marland Kitchen diltiazem (CARDIZEM CD) 180 MG 24 hr capsule Take 180 mg by mouth daily.       . furosemide (LASIX) 40 MG tablet Take 20 mg by mouth daily as needed. For  fluid retention       . guaiFENesin (MUCINEX) 600 MG 12 hr tablet Take 1 tablet (600 mg total) by mouth 2 (two) times daily.      Marland Kitchen levothyroxine (SYNTHROID, LEVOTHROID) 100 MCG tablet Take 1 tablet (100 mcg total) by mouth daily.  90 tablet  3  . metoprolol (LOPRESSOR) 100 MG tablet Take 150 mg by mouth 2 (two) times daily.        . Multiple Vitamins-Minerals (ICAPS AREDS FORMULA PO) Take 1 capsule by mouth 2 (two) times daily.       . nitroGLYCERIN (NITROSTAT) 0.4 MG SL tablet Place 0.4 mg under the tongue every 5 (five) minutes as needed. For chest pain      . Omega 3-6-9 Fatty Acids (OMEGA 3-6-9 COMPLEX PO) Take 1 capsule by mouth 2 (two) times daily.       . potassium chloride SA (K-DUR,KLOR-CON) 20 MEQ tablet Take 10 mEq by mouth daily. When taking furosemide       . Probiotic Product (ALIGN PO) Take 4 mg by mouth every morning.       . quinapril (ACCUPRIL) 20 MG tablet  Take 20 mg by mouth daily.      Marland Kitchen warfarin (COUMADIN) 2.5 MG tablet Take 2.5 mg by mouth every evening. 1.5 (3.75mg ) tabs everyday except on Fridays and mondays pt take 1 tab (total 2.5mg )        HOSPITAL MEDICATIONS: I have reviewed the patient's current medications.  VITALS: Blood pressure 150/72, pulse 69, temperature 99 F (37.2 C), resp. rate 22, height 5' 3.5" (1.613 m), weight 87.771 kg (193 lb 8 oz), SpO2 92.00%.  PHYSICAL EXAM: General appearance: alert, cooperative and mild distress Neck: no carotid bruit, no JVD, supple, symmetrical, trachea midline and thyroid not enlarged, symmetric, no tenderness/mass/nodules Lungs: decreased breath sounds Heart: regular rate and rhythm and 2/6 holosystolic murmur Abdomen: soft, non-tender; bowel sounds normal; no masses,  no organomegaly Extremities: trace edema Pulses: 2+ and symmetric Skin: cool and dry Neurologic: Grossly normal  LABS: Results for orders placed in visit on 11/24/11 (from the past 48 hour(s))  POCT CBC     Status: Abnormal   Collection Time   11/24/11 11:32 AM      Component Value Range Comment   WBC 13.4 (*) 4.6 - 10.2 (K/uL)    Lymph, poc 1.6  0.6 - 3.4     POC LYMPH PERCENT 11.8  10 - 50 (%L)    MID (cbc) 0.9  0 - 0.9     POC MID % 6.7  0 - 12 (%M)    POC Granulocyte 10.9 (*) 2 - 6.9     Granulocyte percent 81.5 (*) 37 - 80 (%G)    RBC 4.26  4.04 - 5.48 (M/uL)    Hemoglobin 12.8  12.2 - 16.2 (g/dL)    HCT, POC 45.4  09.8 - 47.9 (%)    MCV 96.1  80 - 97 (fL)    MCH, POC 30.0  27 - 31.2 (pg)    MCHC 31.3 (*) 31.8 - 35.4 (g/dL)    RDW, POC 11.9      Platelet Count, POC 215  142 - 424 (K/uL)    MPV 9.4  0 - 99.8 (fL)    CMP     Component Value Date/Time   NA 141 07/22/2011 0830   K 4.6 07/22/2011 0830   CL 102 07/22/2011 0830   CO2 28 07/22/2011 0830   GLUCOSE 86 07/22/2011 0830  BUN 24* 07/22/2011 0830   CREATININE 1.10 07/22/2011 0830   CREATININE 1.30* 05/10/2011 0633   CALCIUM 9.0 07/22/2011 0830    PROT 7.1 07/22/2011 0830   ALBUMIN 3.9 07/22/2011 0830   AST 24 07/22/2011 0830   ALT 17 07/22/2011 0830   ALKPHOS 77 07/22/2011 0830   BILITOT 0.5 07/22/2011 0830   GFRNONAA 37* 05/10/2011 0633   GFRAA 43* 05/10/2011 0633    IMAGING: Dg Chest 2 View  11/24/2011  *RADIOLOGY REPORT*  Clinical Data: Severe shortness of breath.  Cough.  CHEST - 2 VIEW  Comparison: Portable chest x-ray 11/10//2012 [facility], two-view chest x-ray 04/25/2011, 01/08/2010 [facility].  Findings: Prior sternotomy for CABG.  Cardiac silhouette moderately enlarged but stable. Localized airspace consolidation in the superior segment right lower lobe.  Mild diffuse interstitial pulmonary edema, new since the prior examinations.  No pleural effusions.  Visualized bony thorax intact with slight exaggeration of the usual kyphosis.  Lateral image blurred by respiratory motion.  IMPRESSION: Pneumonia involving the superior segment of the right lower lobe, superimposed upon mild interstitial pulmonary edema.  Clinically significant discrepancy from primary report, if provided: None  Original Report Authenticated By: Arnell Sieving, M.D.    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 She has superimposed acute on chronic diastolic dysfunction, BNP 5k.  Time Spent Directly with Patient: 45 minutes  KILROY,LUKE K 11/24/2011, 3:32 PM    Patient seen and examined. Agree with assessment and plan. 78 yo WF with history of HOCM, CAD, PAF now in  sinus rhythm admitted with RLL superior segment pneumonia and mild diastolic CHF. WBC is elevated. She was treated with Lasix 20 mg iv in ER, antibiotics started. No angina. Must avoid over diuresis with HOCM. Will follow.   Lennette Bihari, MD, [facility] 11/24/2011 3:53 PM

## 2011-11-24 NOTE — H&P (Signed)
Family Medicine [facility] Admission History and Physical  Patient name: Carla Black Medical record number: [id number] Date of birth: 1927-09-03 Age: 78 y.o. Gender: female  Primary Care Provider: Tonye Pearson, MD, MD of [facility]  Chief Complaint: Shortness of Breath History of Present Illness: Carla Black is a 78 y.o. year old female  With a history of pneumonia in November which led to CHF and a fib with RVR, HOCM, CHF diastolic presenting with shortness of breath.   Patient states since Friday or Saturday she has noticed dyspnea on exertion. Lives in a retirement community and cannot walk to dinner without getting SOB. This has been progressive. Associated with increased cough and sputum production over last few days. She says overall she has not felt like herself since hospitalized in November of last year with increased fatigue and some difficulty breathing but that current fatigue/malaise is much worse. Subjective fevers and some chills. Patient endorses orthopnea but no PND. No chest pain or tightness. Has had some decreased appetite but able to tolerate fluids withotu difficulty. Patient uses a walker and says she has had to use this to rest from SOB but not from weakness.   Seen at [facility] urgent care, CXR showed RLL with some background intrastitial edema. Patient also with WBC of 13k and noted to be hypoxic to 86%. Patient responded well to 3L and was back into mid 90s. Patient directly admitted to [facility] for treatment of hypoxia/pneumonia as well as a concern of CHF component.   Past Medical History  Diagnosis Date  . Hypertension   . Hyperlipidemia   . Heart murmur   . GERD (gastroesophageal reflux disease)   . Enlarged heart   . Arthritis   . Joint pain     left knee  . Dizziness   . Chest tightness 02/09/2008  . Hiatal hernia   . CHF (congestive heart failure)   . Carotid artery occlusion   . Shortness of breath   . Angina   .  Hyperthyroidism   . Cancer   . Coronary artery disease   . Pneumonia   . Hypertrophic cardiomyopathy     subvalvular gradient 45-mm  denies COPD, asthma.   Past Surgical History  Procedure Date  . Appendectomy     and tonsillectomy  . Abdominal hysterectomy   . Malignant melanoma removal 1961  . Abdominal surgery     benign tumor removed  . Vocal cord surgery     vocal cord node removed  . Thymus gland tumor resection 1991  . Cardiac catheterization     Stent CX 40% with nml ffr  . Tee without cardioversion 05/01/2011    Procedure: TRANSESOPHAGEAL ECHOCARDIOGRAM (TEE);  Surgeon: Thurmon Fair;  Location: [facility] END[facility]COPY;  Service: Cardiovascular;  Laterality: N/A;  . Cardioversion 05/01/2011    Procedure: CARDIOVERSION;  Surgeon: Rachelle Hora Croitoru;  Location: [facility] ENDOSCOPY;  Service: Cardiovascular;  Laterality: N/A;  . Cardioversion 06/30/2011    Procedure: CARDIOVERSION;  Surgeon: Thurmon Fair, MD;  Location: [facility] OR;  Service: Cardiovascular;  Laterality: N/A;    Family History  Problem Relation Age of Onset  . Heart disease Mother   . Other Father     aneurysm  . Coronary artery disease Other   . Stroke Other    Social History:  reports that she quit smoking about 22 years ago. Her smoking use included Cigarettes. She does not have any smokeless tobacco history on file. She reports that she drinks about .  6 ounces of alcohol per week. She reports that she does not use illicit drugs. Lives in [facility] a retirement community with 81 year old husband. Independent ADLs. Quit smoking in 1989.   Allergies:  Allergies  Allergen Reactions  . Metoprolol Other (See Comments)    Bradycardia  . Codeine Nausea Only    And dizziness  . Seldane (Terfenadine) Nausea Only    And dizziness  . Statins     myalgia    Medications Prior to Admission  Medication Sig Dispense Refill  . albuterol (PROVENTIL HFA;VENTOLIN HFA) 108 (90 BASE) MCG/ACT inhaler Inhale 2 puffs into the lungs every  6 (six) hours as needed for wheezing.  1 Inhaler  3  . amiodarone (PACERONE) 200 MG tablet Take 200 mg by mouth every morning.       Marland Kitchen aspirin 81 MG tablet Take 81 mg by mouth daily.       Marland Kitchen diltiazem (CARDIZEM CD) 180 MG 24 hr capsule Take 180 mg by mouth daily.       . furosemide (LASIX) 40 MG tablet Take 20 mg by mouth daily as needed. For fluid retention       . guaiFENesin (MUCINEX) 600 MG 12 hr tablet Take 1 tablet (600 mg total) by mouth 2 (two) times daily.      Marland Kitchen levothyroxine (SYNTHROID, LEVOTHROID) 100 MCG tablet Take 1 tablet (100 mcg total) by mouth daily.  90 tablet  3  . metoprolol (LOPRESSOR) 100 MG tablet Take 150 mg by mouth 2 (two) times daily.        . Multiple Vitamins-Minerals (ICAPS AREDS FORMULA PO) Take 1 capsule by mouth 2 (two) times daily.       . nitroGLYCERIN (NITROSTAT) 0.4 MG SL tablet Place 0.4 mg under the tongue every 5 (five) minutes as needed. For chest pain      . Omega 3-6-9 Fatty Acids (OMEGA 3-6-9 COMPLEX PO) Take 1 capsule by mouth 2 (two) times daily.       . potassium chloride SA (K-DUR,KLOR-CON) 20 MEQ tablet Take 10 mEq by mouth daily. When taking furosemide       . Probiotic Product (ALIGN PO) Take 4 mg by mouth every morning.       . quinapril (ACCUPRIL) 20 MG tablet Take 20 mg by mouth daily.      Marland Kitchen warfarin (COUMADIN) 2.5 MG tablet Take 2.5 mg by mouth every evening. 1.5 (3.75mg ) tabs everyday except on Fridays and mondays pt take 1 tab (total 2.5mg )        Results for orders placed in visit on 11/24/11 (from the past 48 hour(s))  POCT CBC     Status: Abnormal   Collection Time   11/24/11 11:32 AM      Component Value Range Comment   WBC 13.4 (*) 4.6 - 10.2 (K/uL)    Lymph, poc 1.6  0.6 - 3.4     POC LYMPH PERCENT 11.8  10 - 50 (%L)    MID (cbc) 0.9  0 - 0.9     POC MID % 6.7  0 - 12 (%M)    POC Granulocyte 10.9 (*) 2 - 6.9     Granulocyte percent 81.5 (*) 37 - 80 (%G)    RBC 4.26  4.04 - 5.48 (M/uL)    Hemoglobin 12.8  12.2 - 16.2 (g/dL)     HCT, POC 16.1  09.6 - 47.9 (%)    MCV 96.1  80 - 97 (fL)  MCH, POC 30.0  27 - 31.2 (pg)    MCHC 31.3 (*) 31.8 - 35.4 (g/dL)    RDW, POC 16.1      Platelet Count, POC 215  142 - 424 (K/uL)    MPV 9.4  0 - 99.8 (fL)    Dg Chest 2 View  11/24/2011  *RADIOLOGY REPORT*  Clinical Data: Severe shortness of breath.  Cough.  CHEST - 2 VIEW  Comparison: Portable chest x-ray 11/10//2012 [facility], two-view chest x-ray 04/25/2011, 01/08/2010 [facility].  Findings: Prior sternotomy for CABG.  Cardiac silhouette moderately enlarged but stable. Localized airspace consolidation in the superior segment right lower lobe.  Mild diffuse interstitial pulmonary edema, new since the prior examinations.  No pleural effusions.  Visualized bony thorax intact with slight exaggeration of the usual kyphosis.  Lateral image blurred by respiratory motion.  IMPRESSION: Pneumonia involving the superior segment of the right lower lobe, superimposed upon mild interstitial pulmonary edema.  Clinically significant discrepancy from primary report, if provided: None  Original Report Authenticated By: Arnell Sieving, M.D.    ROS see HPI  Blood pressure 150/72, pulse 69, temperature 99 F (37.2 C), resp. rate 22, height 5' 3.5" (1.613 m), weight 193 lb 8 oz (87.771 kg), SpO2 92.00%. Physical Exam  Constitutional: She is oriented to person, place, and time. She appears well-developed and well-nourished.  HENT:  Head: Normocephalic and atraumatic.  Mouth/Throat: Oropharynx is clear and moist. No oropharyngeal exudate.  Eyes: Conjunctivae and EOM are normal. Pupils are equal, round, and reactive to light.  Neck: Normal range of motion. Neck supple.  Cardiovascular: Normal rate and regular rhythm.   Murmur (3/6 SEM diffusely heard) heard. Respiratory: She is in respiratory distress (increased effort. Patient able to complete full sentences but must pause in between. Slightly tachypneic. ). She has  wheezes (occassional diffuse wheeze which clears with cough. ). She has no rales (did not note as previously heard. ).       Actively coughing during exam.   GI: Soft. Bowel sounds are normal. She exhibits no distension. There is no tenderness.  Musculoskeletal: Normal range of motion. She exhibits edema (1+ pitting edema. ). She exhibits no tenderness.       Right ankle > in size than left but recent ankle injury. No calf tenderness. No difference in size 10cm below tibial plataeu when comparing left to right.   Lymphadenopathy:    She has no cervical adenopathy.  Neurological: She is alert and oriented to person, place, and time. No cranial nerve deficit.  Skin: Skin is warm and dry. She is not diaphoretic.     Assessment/Plan  Carla Black is a 78 y.o. year old female  With a history of CAD, CHF diastolic, CHF as well as pneumonia in November which led to CHF and a fib presenting with shortness of breath and found to have WBC 13k, increased work of breathing, hypoxemic new onset requiring o2, and with RLL showing PNA concerning for community acquired pneumonia.   1. CAP-patient not recently hospitalized or at risk for hospital acquired pneumonia  -will treat with CTX and azithromycin since requiring inpatient  -patient stable on 2L with o2 sats of 92%. Pulse ox with vitals  -SOB may also be related to CHF-see problem #2.   -will continue albuterol per home regimen although no diagnosis COPD or asthma.   -mucinex 1200mg  BID   -will get baseline EKG at this time  2. CHF-patient with  a history of diastolic CHF. Most recent echo in November with EF 60-65%. Poor diastolic function possibly related to HOCM. Patient has some edema but reportedly not worse than baseline per patient. No crackles on my exam.   -will give 1x dose of 20mg  Lasix to see if this improves breathing  -daily weights and strict i/os.   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 Likely contributing to CHF.   3. History of A fib with RVR-patient received cardioversion in November and converted to NSR but subsequently developed a fib again. Spontaneously reverted back to NSR after that time.   -prn nitro and will reeval if CP develops  -will continue home meds including: amiodarone, diltiazem, metoprolol  -will also continue coumadin. INR reportedly therapeutic at 2.8 this AM on 2.5mg  coumadin. Will check pt/inr in AM  4. Other CV-history of CAD and HOCM. No current chest pain, will not cycle CE at this time.   -HTN: will continue ace inhibitor, ccb, beta blocker  -HLD- AM lipid panel. Patient reportedly intolerant to statins.   5.  Hypothyroidism: continue synthroid. Pending TSH.   FEN/GI-heart healthy diet. AM BMET. Continue home potassium of 10 meq. SLIV. Due to history of c diff, will continue home probiotics PPx-sq heparin Disposition-pending clinical improvement   Tana Conch, MD, PGY1 11/24/2011 3:32 PM

## 2011-11-24 NOTE — H&P (Signed)
I have seen and examined this patient. I have discussed with Dr Durene Cal.  I agree with their findings and plans as documented in their admission note for today.

## 2011-11-24 NOTE — Progress Notes (Signed)
Subjective:    Patient ID: Carla Black, female    DOB: 1928/01/22, 78 y.o.   MRN: [id number]  HPIBrought in as an emergency due to shortness of breath Pulse ox was 86 and she had tachypnea which responded to 3 L of oxygen immediately with resultant pulse ox 96 She started coughing May 31 and got much worse the next day with fever and chills and sputum production. Her level of fatigue increased. She couldn't lie down to sleep due to cough and shortness of breath. She denies chest pain or palpitations. She was in Dr. Fredirick Maudlin office this morning for a Coumadin check.  Patient Active Problem List  Diagnoses  . Hypertension  . Hyperlipidemia  . GERD (gastroesophageal reflux disease)  . Hyperthyroidism  . Coronary artery disease-Also has a 50% blockage of the internal carotid artery on the right  . Clostridium difficile infection  . Atrial fibrillation with rapid ventricular response-Was hospitalized in November 2012 and had very difficult cardioversion over several attempts but has been stable since  . Hypertrophic obstructive cardiomyopathy with diastolic heart failure  . Acute on chronic diastolic heart failure  . Mitral insufficiency due to HOCM and ruptured chorda tendina  . Anticoagulation monitoring, INR range 2-3  . Hypothyroid-Labs were nl in January   Dr. Alwyn Ren started her on albuterol on 11/19/2011 when she was here for a foot problem because he noticed wheezing on forced expiration: Current outpatient prescriptions:albuterol (PROVENTIL HFA;VENTOLIN HFA) 108 (90 BASE) MCG/ACT inhaler, Inhale 2 puffs into the lungs every 6 (six) hours as needed for wheezing., Disp: 1 Inhaler, Rfl: 3;  amiodarone (PACERONE) 200 MG tablet, Take 200 mg by mouth every morning. , Disp: , Rfl: ;  aspirin 81 MG tablet, Take 81 mg by mouth daily. , Disp: , Rfl:  diltiazem (CARDIZEM CD) 180 MG 24 hr capsule, Take 180 mg by mouth 2 (two) times daily.  , Disp: , Rfl: ;  furosemide (LASIX) 40 MG tablet,  Take 20 mg by mouth daily as needed. For fluid retention , Disp: , Rfl: ;  guaiFENesin (MUCINEX) 600 MG 12 hr tablet, Take 1 tablet (600 mg total) by mouth 2 (two) times daily., Disp: , Rfl:  levothyroxine (SYNTHROID, LEVOTHROID) 100 MCG tablet, Take 1 tablet (100 mcg total) by mouth daily., Disp: 90 tablet, Rfl: 3;  metoprolol (LOPRESSOR) 100 MG tablet, Take 150 mg by mouth 2 (two) times daily.  , Disp: , Rfl: ;  Multiple Vitamins-Minerals (ICAPS AREDS FORMULA PO), Take by mouth 2 (two) times daily. , Disp: , Rfl:  nitroGLYCERIN (NITROSTAT) 0.4 MG SL tablet, Place 0.4 mg under the tongue every 5 (five) minutes as needed. For chest pain, Disp: , Rfl: ;  Omega 3-6-9 Fatty Acids (OMEGA 3-6-9 COMPLEX PO), Take 1 tablet by mouth 2 (two) times daily.  , Disp: , Rfl: ;  potassium chloride SA (K-DUR,KLOR-CON) 20 MEQ tablet, Take 10 mEq by mouth daily. When taking furosemide , Disp: , Rfl:  Probiotic Product (ALIGN PO), Take 4 mg by mouth every morning. , Disp: , Rfl: ;  warfarin (COUMADIN) 2.5 MG tablet, Take 2.5 mg by mouth once. 1.5 (3.75mg ) tabs everyday except on Fridays pt take 1 tab (total 2.5mg ) , Disp: , Rfl: ;  DISCONTD: amiodarone (PACERONE) 200 MG tablet, Take 1 tablet (200 mg total) by mouth daily., Disp: 30 tablet, Rfl: 5 DISCONTD: diltiazem (CARDIZEM CD) 180 MG 24 hr capsule, Take 1 capsule (180 mg total) by mouth daily., Disp: 30 capsule, Rfl: 5;  DISCONTD: metoprolol (LOPRESSOR) 100 MG tablet, Take 1 tablet (100 mg total) by mouth 2 (two) times daily., Disp: 60 tablet, Rfl: 5;  DISCONTD: warfarin (COUMADIN) 2.5 MG tablet, Take 1 tablet (2.5 mg total) by mouth once., Disp: 30 tablet, Rfl: 5   Review of SystemsHer problems with fatigue continue She has some dyspnea on exertion but is not very active No weight loss or night sweats and no change in peripheral edema No recent nausea or vomiting No urinary tract symptoms No change in sensorium     Objective:   Physical ExamShe was apprehensive and  tachypneic prior to oxygen therapy She is alert and oriented Pupils equal round reactive to light and accommodation Heart has a regular rhythm with a rate of 65/There is a grade 3 holosystolic murmur along the  L sternal border.  She became short of breath walking to the x-ray room despite 3 L of oxygen Crackles are noted stone forced expiration anteriorly bilaterally/diminished breath sounds at both bases/increased wheezing with forced expiration bilaterally  Results for orders placed in visit on 11/24/11  POCT CBC      Component Value Range   WBC 13.4 (*) 4.6 - 10.2 (K/uL)   Lymph, poc 1.6  0.6 - 3.4    POC LYMPH PERCENT 11.8  10 - 50 (%L)   MID (cbc) 0.9  0 - 0.9    POC MID % 6.7  0 - 12 (%M)   POC Granulocyte 10.9 (*) 2 - 6.9    Granulocyte percent 81.5 (*) 37 - 80 (%G)   RBC 4.26  4.04 - 5.48 (M/uL)   Hemoglobin 12.8  12.2 - 16.2 (g/dL)   HCT, POC 54.0  98.1 - 47.9 (%)   MCV 96.1  80 - 97 (fL)   MCH, POC 30.0  27 - 31.2 (pg)   MCHC 31.3 (*) 31.8 - 35.4 (g/dL)   RDW, POC 19.1     Platelet Count, POC 215  142 - 424 (K/uL)   MPV 9.4  0 - 99.8 (fL)   [facility] reading (PRIMARY) by  Dr.Boyd Litaker=Dense infiltrate in the right lower lobe-? Pneumonia Scattered increased markings bilaterally raising the question of congestive failure   EKG shows ST segment depression in the Anterolateral areas but this is no different from her EKG  January 2013   Pulse ox remained at 98 on 3 L    Assessment & Plan:  Problem #1 hypoxia secondary to pneumonia with possible congestive heart failure Plan-Will admit to Dr. Elwyn Reach of the [facility] for further evaluation and treatment/Dr Croituro's practice will consult if needed  EMS called for transport  Pending blood work ordered at this visit includes CMET, TSH, and BNP  Problem #2 since her hospitalization she has continued to be very fatigued  And has functioned at a low level, sleeping frequently throughout the day  and  Avoiding her regular exercise/This may be partially due to her medications ot to a poor ejection fraction and perhaps cardiology can consider This during her hospitalization

## 2011-11-25 ENCOUNTER — Inpatient Hospital Stay (HOSPITAL_COMMUNITY): Payer: Medicare Other

## 2011-11-25 LAB — COMPREHENSIVE METABOLIC PANEL
ALT: 17 U/L (ref 0–35)
AST: 21 U/L (ref 0–37)
Alkaline Phosphatase: 83 U/L (ref 39–117)
BUN: 27 mg/dL — ABNORMAL HIGH (ref 6–23)
Creat: 1.5 mg/dL — ABNORMAL HIGH (ref 0.50–1.10)
Total Bilirubin: 1 mg/dL (ref 0.3–1.2)

## 2011-11-25 LAB — BASIC METABOLIC PANEL
BUN: 25 mg/dL — ABNORMAL HIGH (ref 6–23)
Chloride: 98 mEq/L (ref 96–112)
GFR calc non Af Amer: 38 mL/min — ABNORMAL LOW (ref >90)
Glucose, Bld: 118 mg/dL — ABNORMAL HIGH (ref 70–99)
Potassium: 4 mEq/L (ref 3.5–5.1)
Sodium: 140 mEq/L (ref 135–145)

## 2011-11-25 LAB — LIPID PANEL
Cholesterol: 209 mg/dL — ABNORMAL HIGH (ref 0–200)
Total CHOL/HDL Ratio: 3.9 RATIO
Triglycerides: 91 mg/dL (ref <150)

## 2011-11-25 LAB — PROTIME-INR: INR: 2.91 — ABNORMAL HIGH (ref 0.00–1.49)

## 2011-11-25 MED ORDER — FUROSEMIDE 8 MG/ML PO SOLN
40.0000 mg | Freq: Once | ORAL | Status: DC
  Filled 2011-11-25: qty 5

## 2011-11-25 MED ORDER — ALBUTEROL SULFATE (5 MG/ML) 0.5% IN NEBU
2.5000 mg | INHALATION_SOLUTION | Freq: Once | RESPIRATORY_TRACT | Status: AC
  Administered 2011-11-25 (×2): 2.5 mg via RESPIRATORY_TRACT

## 2011-11-25 MED ORDER — HYDRALAZINE HCL 20 MG/ML IJ SOLN
10.0000 mg | INTRAMUSCULAR | Status: DC | PRN
  Administered 2011-11-25: 10 mg via INTRAVENOUS
  Filled 2011-11-25: qty 1

## 2011-11-25 MED ORDER — FUROSEMIDE 10 MG/ML IJ SOLN
40.0000 mg | Freq: Once | INTRAMUSCULAR | Status: AC
  Administered 2011-11-25: 40 mg via INTRAVENOUS
  Filled 2011-11-25: qty 4

## 2011-11-25 MED ORDER — ALBUTEROL SULFATE (5 MG/ML) 0.5% IN NEBU
INHALATION_SOLUTION | RESPIRATORY_TRACT | Status: AC
  Administered 2011-11-25: 2.5 mg via RESPIRATORY_TRACT
  Filled 2011-11-25: qty 0.5

## 2011-11-25 NOTE — Progress Notes (Signed)
Called MD with concerns of pt's increasing SOB and decreased urine output after receiving IV lasix and albuterol. MD coming to bedside to assess. Will continue to monitor. Duwaine Maxin, RN

## 2011-11-25 NOTE — Progress Notes (Signed)
Patient ID: Carla Black, female   DOB: 03-01-1928, 78 y.o.   MRN: [id number]  PGY-1 Daily Progress Note Family Medicine Teaching Service Aldine Contes. Marti Sleigh, MD Service Pager: [phone]  Subjective: Patient says that her breathing is about the same as yesterday. No improvement at this time but also no worsening.   Objective:  Temp:  [99 F (37.2 C)-99.6 F (37.6 C)] 99.2 F (37.3 C) (06/05 0531) Pulse Rate:  [61-83] 72  (06/05 0531) Resp:  [18-22] 20  (06/05 0531) BP: (150-181)/(53-81) 156/60 mmHg (06/05 0531) SpO2:  [86 %-96 %] 96 % (06/05 0531) Weight:  [191 lb 2.2 oz (86.7 kg)-193 lb 8 oz (87.771 kg)] 191 lb 2.2 oz (86.7 kg) (06/05 0531)  Intake/Output Summary (Last 24 hours) at 11/25/11 0641 Last data filed at 11/25/11 0500  Gross per 24 hour  Intake      0 ml  Output    650 ml  Net   -650 ml   Exam: Constitutional: She is oriented to person, place, and time. She appears well-developed and well-nourished.  Head: Normocephalic and atraumatic.  Mouth/Throat: MMM No oropharyngeal exudate.  Eyes: Conjunctivae and EOM are normal. Pupils are equal, round, and reactive to light.  Neck: Normal range of motion. Neck supple.  Cardiovascular: Normal rate and regular rhythm. Murmur (3/6 SEM diffusely heard) heard.  Respiratory: Respiratory status has improved slightly. No longer tachypneic on my exam. She can complete full sentences still but has to pause in between. I did not appreciate any wheeze today. No crackles. Actively coughing during exam.  GI: Soft. Bowel sounds are normal. She exhibits no distension. There is no tenderness.  Musculoskeletal: Normal range of motion. She exhibits edema (1+ pitting edema-I would say slightly worse than yesterday but would not call 2+ ). Tender in area she is edematous.  Neurological: She is alert and oriented to person, place, and time. No cranial nerve deficit.  Skin: Skin is warm and dry. She is not diaphoretic.    Labs and imaging:     CBC  Lab 11/24/11 1132 11/19/11 1247  WBC 13.4* 7.5  HGB 12.8 13.6  HCT 40.9 42.8  PLT -- --   BMET/CMET  Lab 11/25/11 0530 11/24/11 1529 11/24/11 1132  NA 140 140 141  K 4.0 4.2 4.3  CL 98 100 104  CO2 31 29 27   BUN 25* 25* 27*  CREATININE 1.28* 1.27* 1.50*  CALCIUM 8.8 8.9 8.2*  PROT -- 7.8 6.9  BILITOT -- 0.6 1.0  ALKPHOS -- 92 83  ALT -- 20 17  AST -- 21 21  GLUCOSE 118* 148* 116*   PRO B NATRIURETIC PEPTIDE     Status: Abnormal   Collection Time   11/24/11  3:29 PM      Component Value Range   Pro B Natriuretic peptide (BNP) 13052.0 (*) 0 - 450 (pg/mL)  PROTIME-INR     Status: Abnormal   Collection Time   11/24/11  3:29 PM      Component Value Range   Prothrombin Time 29.7 (*) 11.6 - 15.2 (seconds)   INR 2.77 (*) 0.00 - 1.49    X-ray Chest Pa And Lateral   11/25/2011  *RADIOLOGY REPORT*  Clinical Data: Shortness of breath, cough, pneumonia.  CHEST - 2 VIEW  Comparison: 11/24/2011  Findings: Prior median sternotomy.  Cardiomegaly with diffuse interstitial opacities, slightly worsened.  Suspect mild interstitial pulmonary edema, slightly progressed.  Patchy focal opacity in the right lower lobe, stable.  No  effusions.  No acute bony abnormality.  IMPRESSION: Stable right lower lobe consolidation/pneumonia.  Increasing interstitial prominence, likely interstitial edema.  Original Report Authenticated By: Cyndie Chime, M.D.   Dg Chest 2 View  11/24/2011  *RADIOLOGY REPORT*  Clinical Data: Severe shortness of breath.  Cough.  CHEST - 2 VIEW  Comparison: Portable chest x-ray 11/10//2012 [facility], two-view chest x-ray 04/25/2011, 01/08/2010 [facility].  Findings: Prior sternotomy for CABG.  Cardiac silhouette moderately enlarged but stable. Localized airspace consolidation in the superior segment right lower lobe.  Mild diffuse interstitial pulmonary edema, new since the prior examinations.  No pleural effusions.  Visualized bony thorax intact  with slight exaggeration of the usual kyphosis.  Lateral image blurred by respiratory motion.  IMPRESSION: Pneumonia involving the superior segment of the right lower lobe, superimposed upon mild interstitial pulmonary edema.  Clinically significant discrepancy from primary report, if provided: None  Original Report Authenticated By: Arnell Sieving, M.D.     Assessment  Carla Black is a 78 y.o. year old female With a history of CAD, CHF diastolic, CHF as well as pneumonia in November which led to CHF and a fib presenting new CAP in RLL and concern for CHF exacerbation.   1. CAP-patient not recently hospitalized or at risk for hospital acquired pneumonia  -will treat with CTX and azithromycin since requiring inpatient.   -afebrile since admission  -did not repeat CBC as would not change management.  -patient stable on 2L with o2 sats of 92%. Pulse ox with vitals. Weaned to 1.5 L this morning to see if patient can tolerate.  -SOB may also be related to CHF-see problem #2.  -will continue albuterol per home regimen although no diagnosis COPD or asthma.  -mucinex 1200mg  BID    2. CHF-patient with a history of diastolic CHF. Most recent echo in November with EF 60-65%. Poor diastolic function possibly related to HOCM. Patient has some edema but reportedly not worse than baseline per patient. No crackles on my exam.  -1x dose of 20mg  Lasix yesterday with little improvement but difficult to quantify diuresis given inadequate recording. Must be gentle with diuresis given HOCM  -given CXR with increased edema and slightly increased LE edema. Would consider another 1x dose of Lasix. Will discuss with team. Did not note JVD.  -daily weights and strict i/os. Weight down 2 lbs. Appears i/os inadequately recorded.  -pro-BNP at 13k. Higher than values recorded before discharge on last admission.  -Mitral insufficiency due to HOCM and ruptured chorda tendina, 2D 11/12. Likely contributing to CHF.   -EKG with NSR. LBB read but qrs <120 so no BBB.   3. History of A fib with RVR-patient received cardioversion in November and converted to NSR but subsequently developed a fib again. Spontaneously reverted back to NSR after that time.  -currently in NSR -prn nitro and will reeval if CP develops  -will continue home meds including: amiodarone, diltiazem, metoprolol  -will also continue coumadin. INR reportedly therapeutic yesterday around 2.8  on 2.5mg  coumadin. INR 2.9 this AM. Will continue to monitor daily. May need to hold dose x 1 day if goes above 3.    4. Other CV-history of CAD and HOCM. No current chest pain, will not cycle CE at this time.   -HTN: will continue ace inhibitor, ccb, beta blocker. Patient has had some elevated BPs. Wonder if this is caused by fluid overload. Most recently 150/72 which we can tolerate given age >80.   -  HLD- Patient reportedly intolerant to statins. LDL >130. Will attempt to discuss statin use with patient.  5. Hypothyroidism: continue synthroid. Pending TSH.  6. CKD STage III-stable. WIll want to be cautious with diuresis.   FEN/GI-heart healthy diet. AM BMET tomorrow. Continue home potassium of 10 meq. SLIV. Due to history of c diff, will continue home probiotics  PPx-sq heparin  Disposition-pending clinical improvement  Tana Conch, MD PGY1, Family Medicine Teaching Service [phone]

## 2011-11-25 NOTE — Progress Notes (Signed)
Utilization review completed.  

## 2011-11-25 NOTE — Progress Notes (Signed)
I discussed with Dr Hunter.  I agree with their plans documented in their progress note for today.  

## 2011-11-25 NOTE — Progress Notes (Signed)
[facility]  Subjective: Not feeling any better.  SOB  Objective: Vital signs in last 24 hours: Temp:  [99 F (37.2 C)-99.6 F (37.6 C)] 99.2 F (37.3 C) (06/05 0531) Pulse Rate:  [69-83] 81  (06/05 0954) Resp:  [20-22] 20  (06/05 0531) BP: (150-181)/(53-81) 181/73 mmHg (06/05 0954) SpO2:  [92 %-96 %] 96 % (06/05 0531) Weight:  [86.7 kg (191 lb 2.2 oz)-87.771 kg (193 lb 8 oz)] 86.7 kg (191 lb 2.2 oz) (06/05 0531) Last BM Date: 11/25/11  Intake/Output from previous day: 06/04 0701 - 06/05 0700 In: 240 [P.O.:240] Out: 650 [Urine:650] Intake/Output this shift: Total I/O In: 240 [P.O.:240] Out: 227 [Urine:225; Stool:2]  Medications Current Facility-Administered Medications  Medication Dose Route Frequency Provider Last Rate Last Dose  . 0.9 %  sodium chloride infusion  250 mL Intravenous PRN Shelva Majestic, MD      . albuterol (PROVENTIL HFA;VENTOLIN HFA) 108 (90 BASE) MCG/ACT inhaler 2 puff  2 puff Inhalation Q6H PRN Shelva Majestic, MD   2 puff at 11/25/11 0522  . amiodarone (PACERONE) tablet 200 mg  200 mg Oral Daily Shelva Majestic, MD   200 mg at 11/25/11 0954  . antiseptic oral rinse (BIOTENE) solution 15 mL  15 mL Mouth Rinse BID Leighton Roach McDiarmid, MD   15 mL at 11/24/11 2111  . aspirin EC tablet 81 mg  81 mg Oral Daily Leighton Roach McDiarmid, MD   81 mg at 11/25/11 0953  . azithromycin (ZITHROMAX) tablet 500 mg  500 mg Oral Daily Shelva Majestic, MD   500 mg at 11/24/11 1646   Followed by  . azithromycin (ZITHROMAX) tablet 250 mg  250 mg Oral Daily Shelva Majestic, MD   250 mg at 11/25/11 0953  . cefTRIAXone (ROCEPHIN) 1 g in dextrose 5 % 50 mL IVPB  1 g Intravenous Q24H Shelva Majestic, MD   1 g at 11/24/11 1647  . diltiazem (CARDIZEM CD) 24 hr capsule 180 mg  180 mg Oral Daily Shelva Majestic, MD   180 mg at 11/25/11 0953  . furosemide (LASIX) 8 MG/ML solution 40 mg  40 mg Oral Once Shelva Majestic, MD      . furosemide (LASIX)  injection 20 mg  20 mg Intravenous Once Shelva Majestic, MD   20 mg at 11/24/11 1646  . guaiFENesin (MUCINEX) 12 hr tablet 1,200 mg  1,200 mg Oral BID Shelva Majestic, MD   1,200 mg at 11/25/11 0954  . hydrALAZINE (APRESOLINE) injection 10 mg  10 mg Intravenous Q4H PRN Kerry Hough, PA   10 mg at 11/25/11 0531  . levothyroxine (SYNTHROID, LEVOTHROID) tablet 100 mcg  100 mcg Oral Q breakfast Shelva Majestic, MD   100 mcg at 11/25/11 0726  . lisinopril (PRINIVIL,ZESTRIL) tablet 20 mg  20 mg Oral Daily Leighton Roach McDiarmid, MD   20 mg at 11/25/11 0953  . metoprolol (LOPRESSOR) 150 mg  150 mg Oral BID Shelva Majestic, MD   150 mg at 11/25/11 0953  . nitroGLYCERIN (NITROSTAT) SL tablet 0.4 mg  0.4 mg Sublingual Q5 min PRN Shelva Majestic, MD      . potassium chloride (K-DUR,KLOR-CON) CR tablet 10 mEq  10 mEq Oral Daily Shelva Majestic, MD   10 mEq at 11/25/11 0953  . saccharomyces boulardii (FLORASTOR) capsule 250 mg  250 mg Oral Daily Leighton Roach McDiarmid, MD   250 mg at 11/25/11 0953  .  sodium chloride 0.9 % injection 3 mL  3 mL Intravenous Q12H Shelva Majestic, MD   3 mL at 11/25/11 0954  . sodium chloride 0.9 % injection 3 mL  3 mL Intravenous PRN Shelva Majestic, MD      . warfarin (COUMADIN) tablet 2.5 mg  2.5 mg Oral q1800 Shelva Majestic, MD   2.5 mg at 11/24/11 1755  . Warfarin - Physician Dosing Inpatient   Does not apply [id number] Leighton Roach McDiarmid, MD      . DISCONTD: ALIGN CAPS 4 mg  4 mg Oral Daily Shelva Majestic, MD      . DISCONTD: aspirin tablet 81 mg  81 mg Oral Daily Shelva Majestic, MD      . DISCONTD: doxycycline (VIBRA-TABS) tablet 100 mg  100 mg Oral Q12H Shelva Majestic, MD      . DISCONTD: furosemide (LASIX) injection 40 mg  40 mg Intravenous Once Shelva Majestic, MD      . DISCONTD: heparin injection 5,000 Units  5,000 Units Subcutaneous Q8H Shelva Majestic, MD      . DISCONTD: quinapril (ACCUPRIL) tablet 20 mg  20 mg Oral Daily Shelva Majestic, MD      . DISCONTD:  sodium chloride 0.9 % injection 3 mL  3 mL Intravenous Q12H Shelva Majestic, MD        PE: General appearance: alert, cooperative and mild distress Lungs: decreased BS in upper lobes.  No rales or wheeze Heart: regular rate and rhythm Abdomen: +BS Extremities: Trace LEE Pulses: 2+ and symmetric  Lab Results:   Basename 11/24/11 1132  WBC 13.4*  HGB 12.8  HCT 40.9  PLT --   BMET  Basename 11/25/11 0530 11/24/11 1529 11/24/11 1132  NA 140 140 141  K 4.0 4.2 4.3  CL 98 100 104  CO2 31 29 27   GLUCOSE 118* 148* 116*  BUN 25* 25* 27*  CREATININE 1.28* 1.27* 1.50*  CALCIUM 8.8 8.9 8.2*   PT/INR  Basename 11/25/11 0530 11/24/11 1529  LABPROT 30.9* 29.7*  INR 2.91* 2.77*   Cholesterol  Basename 11/25/11 0530  CHOL 209*   Cardiac Enzymes No components found with this basename: TROPONIN:3, CKMB:3  Studies/Results: CHEST - 2 VIEW  Comparison: 11/24/2011  Findings: Prior median sternotomy. Cardiomegaly with diffuse  interstitial opacities, slightly worsened. Suspect mild  interstitial pulmonary edema, slightly progressed. Patchy focal  opacity in the right lower lobe, stable. No effusions. No acute  bony abnormality.  IMPRESSION:  Stable right lower lobe consolidation/pneumonia.  Increasing interstitial prominence, likely interstitial edema.  Assessment/Plan  Principal Problem:  *Pneumonia, Rt LL Active Problems:  Hypertension  Hyperlipidemia  GERD (gastroesophageal reflux disease)  Hyperthyroidism  Coronary artery disease, last cath 9/12-medical Rx  Mitral insufficiency due to HOCM and ruptured chorda tendina, 2D 11/12  Atrial fibrillation, attempted DCCV 11/12, put on Amio, found to be in NSR Jan 2013  Hypertrophic cardiomyopathy  Chronic anticoagulation  Acute on chronic diastolic congestive heart failure  Plan:  Minimal LEE and no rales on exam.  Watch fluid output.  40mg  po lasix today.  BNP my not accurately reflect status.  IT seems a bit high.    LOS: 1 day    Black, Carla 11/25/2011 11:44 AM  I have seen and examined the patient along with Black, BRYAN, PA.  I have reviewed the chart, notes and new data.  I agree with PA's note.  Key new complaints: she felt poorly all day,  but started improving around 5 PM. She thinks nebulized albuterol helped. Has also developed fairly typical right posterior chest pleuritic pain. Still coughing thick yellow sputum. Key examination changes: rales and rub right posterior lung; 2/6 systolic murmur along left sternal border increased w Valsalva   PLAN: No evidence of worsening LV outflow obstruction. Continue to carefully monitor fluid status. At this point dyspnea appears to be mostly due to pneumonia. BNP may be exaggeratedly high in LVH/HOCM, but the current level is clearly higher than it was last November, suggesting hypervolemia. She has received diuretics earlier today.  Carla Fair, MD, [facility] [phone] 11/25/2011, 7:50 PM

## 2011-11-26 LAB — BASIC METABOLIC PANEL
BUN: 33 mg/dL — ABNORMAL HIGH (ref 6–23)
Chloride: 96 mEq/L (ref 96–112)
Creatinine, Ser: 1.44 mg/dL — ABNORMAL HIGH (ref 0.50–1.10)
Glucose, Bld: 102 mg/dL — ABNORMAL HIGH (ref 70–99)
Potassium: 3.8 mEq/L (ref 3.5–5.1)

## 2011-11-26 LAB — CBC
HCT: 38.4 % (ref 36.0–46.0)
MCH: 30.8 pg (ref 26.0–34.0)
MCV: 96 fL (ref 78.0–100.0)
RBC: 4 MIL/uL (ref 3.87–5.11)
WBC: 13.6 10*3/uL — ABNORMAL HIGH (ref 4.0–10.5)

## 2011-11-26 LAB — DIFFERENTIAL
Eosinophils Absolute: 0.1 10*3/uL (ref 0.0–0.7)
Eosinophils Relative: 0 % (ref 0–5)
Lymphocytes Relative: 10 % — ABNORMAL LOW (ref 12–46)
Lymphs Abs: 1.4 10*3/uL (ref 0.7–4.0)
Monocytes Absolute: 1.5 10*3/uL — ABNORMAL HIGH (ref 0.1–1.0)
Monocytes Relative: 11 % (ref 3–12)

## 2011-11-26 MED ORDER — ALBUTEROL SULFATE (5 MG/ML) 0.5% IN NEBU
2.5000 mg | INHALATION_SOLUTION | Freq: Three times a day (TID) | RESPIRATORY_TRACT | Status: DC
  Administered 2011-11-27 – 2011-11-28 (×4): 2.5 mg via RESPIRATORY_TRACT
  Filled 2011-11-26 (×4): qty 0.5

## 2011-11-26 MED ORDER — ALBUTEROL SULFATE (5 MG/ML) 0.5% IN NEBU
2.5000 mg | INHALATION_SOLUTION | RESPIRATORY_TRACT | Status: DC | PRN
  Administered 2011-11-28: 2.5 mg via RESPIRATORY_TRACT
  Filled 2011-11-26: qty 0.5

## 2011-11-26 MED ORDER — CHLORDIAZEPOXIDE HCL 5 MG PO CAPS: 50.0000 mg | ORAL_CAPSULE | Freq: Two times a day (BID) | ORAL | Status: DC

## 2011-11-26 MED ORDER — ALBUTEROL SULFATE HFA 108 (90 BASE) MCG/ACT IN AERS
2.0000 | INHALATION_SPRAY | Freq: Four times a day (QID) | RESPIRATORY_TRACT | Status: DC
  Administered 2011-11-26: 2 via RESPIRATORY_TRACT
  Filled 2011-11-26: qty 6.7

## 2011-11-26 NOTE — Progress Notes (Signed)
Patient ID: Carla Black, female   DOB: 1927-06-30, 78 y.o.   MRN: [id number]  PGY-1 Daily Progress Note Family Medicine Teaching Service Carla Black. Carla Sleigh, MD Service Pager: [phone]  Subjective: Says breathing much improved after nebulizer yesterday.  Overall doing better.   Objective:  Temp:  [97.8 F (36.6 C)-98.8 F (37.1 C)] 98.8 F (37.1 C) (06/06 0600) Pulse Rate:  [69-81] 76  (06/06 0600) Resp:  [20-26] 20  (06/06 0600) BP: (137-181)/(60-73) 142/71 mmHg (06/06 0600) SpO2:  [95 %-96 %] 95 % (06/06 0600)  Intake/Output Summary (Last 24 hours) at 11/26/11 0650 Last data filed at 11/26/11 0100  Gross per 24 hour  Intake    480 ml  Output    778 ml  Net   -298 ml   Exam:  Constitutional: She is oriented to person, place, and time. She appears well-developed and well-nourished.  Head: Normocephalic and atraumatic.  Mouth/Throat: MMM No oropharyngeal exudate.  Eyes: Conjunctivae and EOM are normal. Pupils are equal, round, and reactive to light.  Neck: Normal range of motion. Neck supple.  Cardiovascular: Normal rate and regular rhythm. Murmur (3/6 SEM diffusely heard) heard.  Respiratory: slight bibasilar crackles. May be dependent. Intermittent wheeze heard. Not tachypneic. Able to complete sentences and not pause in between GI: Soft. Bowel sounds are normal. She exhibits no distension. There is no tenderness.  Musculoskeletal: Normal range of motion. She exhibits edema (1+ pitting edema. Tender in area she is edematous.  Neurological: She is alert and oriented to person, place, and time. No cranial nerve deficit.  Skin: Skin is warm and dry. She is not diaphoretic.    Labs and imaging:   CBC  Lab 11/26/11 0730 11/24/11 1132 11/19/11 1247  WBC 13.6* 13.4* 7.5  HGB 12.3 12.8 13.6  HCT 38.4 40.9 42.8  PLT 195 -- --   BMET/CMET  Lab 11/26/11 0730 11/25/11 0530 11/24/11 1529 11/24/11 1132  NA 136 140 140 --  K 3.8 4.0 4.2 --  CL 96 98 100 --  CO2 29 31  29  --  BUN 33* 25* 25* --  CREATININE 1.44* 1.28* 1.27* --  CALCIUM 8.8 8.8 8.9 --  PROT -- -- 7.8 6.9  BILITOT -- -- 0.6 1.0  ALKPHOS -- -- 92 83  ALT -- -- 20 17  AST -- -- 21 21  GLUCOSE 102* 118* 148* --   PRO B NATRIURETIC PEPTIDE     Status: Abnormal   Collection Time   11/24/11  3:29 PM      Component Value Range   Pro B Natriuretic peptide (BNP) 13052.0 (*) 0 - 450 (pg/mL)  PROTIME-INR     Status: Abnormal   Collection Time   11/24/11  3:29 PM      Component Value Range   Prothrombin Time 29.7 (*) 11.6 - 15.2 (seconds)   INR 2.77 (*) 0.00 - 1.49    X-ray Chest Pa And Lateral   11/25/2011  *RADIOLOGY REPORT*  Clinical Data: Shortness of breath, cough, pneumonia.  CHEST - 2 VIEW  Comparison: 11/24/2011  Findings: Prior median sternotomy.  Cardiomegaly with diffuse interstitial opacities, slightly worsened.  Suspect mild interstitial pulmonary edema, slightly progressed.  Patchy focal opacity in the right lower lobe, stable.  No effusions.  No acute bony abnormality.  IMPRESSION: Stable right lower lobe consolidation/pneumonia.  Increasing interstitial prominence, likely interstitial edema.  Original Report Authenticated By: Cyndie Chime, M.D.   Dg Chest 2 View  11/24/2011  *RADIOLOGY REPORT*  Clinical Data: Severe shortness of breath.  Cough.  CHEST - 2 VIEW  Comparison: Portable chest x-ray 11/10//2012 [facility], two-view chest x-ray 04/25/2011, 01/08/2010 [facility].  Findings: Prior sternotomy for CABG.  Cardiac silhouette moderately enlarged but stable. Localized airspace consolidation in the superior segment right lower lobe.  Mild diffuse interstitial pulmonary edema, new since the prior examinations.  No pleural effusions.  Visualized bony thorax intact with slight exaggeration of the usual kyphosis.  Lateral image blurred by respiratory motion.  IMPRESSION: Pneumonia involving the superior segment of the right lower lobe, superimposed upon mild  interstitial pulmonary edema.  Clinically significant discrepancy from primary report, if provided: None  Original Report Authenticated By: Arnell Sieving, M.D.     Assessment  MASAE LUKACS is a 78 y.o. year old female With a history of CAD, CHF diastolic, CHF as well as pneumonia in November which led to CHF and a fib presenting new CAP in RLL and concern for CHF exacerbation.   1. CAP-patient not recently hospitalized or at risk for hospital acquired pneumonia  -will treat with CTX and azithromycin since requiring inpatient.   -afebrile since admission  -did not repeat CBC as would not change management.  -patient stable on 2L with o2 sats of 92%. Pulse ox with vitals. Weaned to 1.5 L this morning to see if patient can tolerate. Asked nursing to continue to wean today. Will need to walk patient without oxygen before d/c.   -will get PT involved today for early mobilization/assessment -SOB may also be related to CHF-see problem #2.  -will continue albuterol per home regimen although no diagnosis COPD or asthma.  -mucinex 1200mg  BID   2. CHF-patient with a history of diastolic CHF. Most recent echo in November with EF 60-65%. Poor diastolic function possibly related to HOCM. Patient has some edema but reportedly not worse than baseline per patient. No crackles on my exam.  -s/p 2 doses of Lasix with little improvement in respiratory status and little diuresis. Cardiology thinks may be overloaded given BNP  -will defer diuretic decision day to cardiology evaluation.   -Must be gentle with diuresis given HOCM -daily weights and strict i/os. Weight down 4 lbs but only down 600 mL since admission.   -pro-BNP at 13k. Higher than values recorded before discharge on last admission.  -Mitral insufficiency due to HOCM and ruptured chorda tendina, 2D 11/12. Likely contributing to CHF.  -EKG with NSR. LBB read but qrs <120 so no BBB.   3. History of A fib with RVR-patient received  cardioversion in November and converted to NSR but subsequently developed a fib again. Spontaneously reverted back to NSR after that time.  -currently in NSR -prn nitro and will reeval if CP develops  -will continue home meds including: amiodarone, diltiazem, metoprolol  -will also continue coumadin.  -will hold dose today as INR >3.5 and likely resume tomorrow.     4. Other CV-history of CAD and HOCM. No current chest pain, will not cycle CE at this time.   -HTN: Stable today-did have a SBP >180 x 1 yesterday AM. will continue ace inhibitor, ccb, beta blocker. Monitor.   -HLD- Patient reportedly intolerant to statins. LDL >130. Will defer discussion to PCP.  5. Hypothyroidism: continue synthroid. TSH wnl.   6. CKD STage III-stable. WIll want to be cautious with diuresis.   FEN/GI-heart healthy diet. Electrolytes stable today. AM BMET tomorrow. Continue home potassium of 10 meq. SLIV. Due to history  of c diff, will continue home probiotics  PPx-sq heparin  Disposition-pending clinical improvement  Tana Conch, MD PGY1, Family Medicine Teaching Service [phone]

## 2011-11-26 NOTE — Progress Notes (Signed)
[facility]  Subjective: The patient feels her breathing has improved a little.  Objective: Vital signs in last 24 hours: Temp:  [97.8 F (36.6 C)-98.8 F (37.1 C)] 98.8 F (37.1 C) (06/06 0600) Pulse Rate:  [69-76] 76  (06/06 0600) Resp:  [20-26] 20  (06/06 0600) BP: (137-142)/(60-71) 142/71 mmHg (06/06 0600) SpO2:  [93 %-96 %] 93 % (06/06 0955) Weight:  [86 kg (189 lb 9.5 oz)] 86 kg (189 lb 9.5 oz) (06/06 0600) Last BM Date: 11/26/11  Intake/Output from previous day: 06/05 0701 - 06/06 0700 In: 480 [P.O.:480] Out: 778 [Urine:775; Stool:3] Intake/Output this shift: Total I/O In: 360 [P.O.:360] Out: 201 [Urine:200; Stool:1]  Medications Current Facility-Administered Medications  Medication Dose Route Frequency Provider Last Rate Last Dose  . 0.9 %  sodium chloride infusion  250 mL Intravenous PRN Shelva Majestic, MD      . albuterol (PROVENTIL HFA;VENTOLIN HFA) 108 (90 BASE) MCG/ACT inhaler 2 puff  2 puff Inhalation Q6H Shelva Majestic, MD      . albuterol (PROVENTIL) (5 MG/ML) 0.5% nebulizer solution 2.5 mg  2.5 mg Nebulization Once Everrett Coombe, DO   2.5 mg at 11/25/11 1642  . albuterol (PROVENTIL) (5 MG/ML) 0.5% nebulizer solution 2.5 mg  2.5 mg Nebulization Q4H PRN Shelva Majestic, MD      . amiodarone (PACERONE) tablet 200 mg  200 mg Oral Daily Shelva Majestic, MD   200 mg at 11/26/11 0949  . antiseptic oral rinse (BIOTENE) solution 15 mL  15 mL Mouth Rinse BID Leighton Roach McDiarmid, MD   15 mL at 11/24/11 2111  . aspirin EC tablet 81 mg  81 mg Oral Daily Leighton Roach McDiarmid, MD   81 mg at 11/26/11 0948  . azithromycin (ZITHROMAX) tablet 250 mg  250 mg Oral Daily Shelva Majestic, MD   250 mg at 11/26/11 0949  . cefTRIAXone (ROCEPHIN) 1 g in dextrose 5 % 50 mL IVPB  1 g Intravenous Q24H Shelva Majestic, MD   1 g at 11/25/11 1536  . diltiazem (CARDIZEM CD) 24 hr capsule 180 mg  180 mg Oral Daily Shelva Majestic, MD   180 mg at 11/26/11 0947   . furosemide (LASIX) injection 40 mg  40 mg Intravenous Once Shelva Majestic, MD   40 mg at 11/25/11 1431  . guaiFENesin (MUCINEX) 12 hr tablet 1,200 mg  1,200 mg Oral BID Shelva Majestic, MD   1,200 mg at 11/26/11 0947  . hydrALAZINE (APRESOLINE) injection 10 mg  10 mg Intravenous Q4H PRN Kerry Hough, PA   10 mg at 11/25/11 0531  . levothyroxine (SYNTHROID, LEVOTHROID) tablet 100 mcg  100 mcg Oral Q breakfast Shelva Majestic, MD   100 mcg at 11/26/11 0911  . lisinopril (PRINIVIL,ZESTRIL) tablet 20 mg  20 mg Oral Daily Leighton Roach McDiarmid, MD   20 mg at 11/26/11 0949  . metoprolol (LOPRESSOR) 150 mg  150 mg Oral BID Shelva Majestic, MD   150 mg at 11/26/11 0953  . nitroGLYCERIN (NITROSTAT) SL tablet 0.4 mg  0.4 mg Sublingual Q5 min PRN Shelva Majestic, MD      . potassium chloride (K-DUR,KLOR-CON) CR tablet 10 mEq  10 mEq Oral Daily Shelva Majestic, MD   10 mEq at 11/26/11 0949  . saccharomyces boulardii (FLORASTOR) capsule 250 mg  250 mg Oral Daily Leighton Roach McDiarmid, MD   250 mg at 11/26/11 0943  .  sodium chloride 0.9 % injection 3 mL  3 mL Intravenous Q12H Shelva Majestic, MD   3 mL at 11/26/11 0950  . sodium chloride 0.9 % injection 3 mL  3 mL Intravenous PRN Shelva Majestic, MD      . Warfarin - Physician Dosing Inpatient   Does not apply [id number] Leighton Roach McDiarmid, MD      . DISCONTD: albuterol (PROVENTIL HFA;VENTOLIN HFA) 108 (90 BASE) MCG/ACT inhaler 2 puff  2 puff Inhalation Q6H PRN Shelva Majestic, MD   2 puff at 11/26/11 0150  . DISCONTD: furosemide (LASIX) 8 MG/ML solution 40 mg  40 mg Oral Once Shelva Majestic, MD      . DISCONTD: warfarin (COUMADIN) tablet 2.5 mg  2.5 mg Oral q1800 Shelva Majestic, MD   2.5 mg at 11/25/11 1714    PE: General appearance: alert, cooperative and no distress Lungs: decreased BS RLL Heart: regular rate and rhythm and 2/6 systolic MM Extremities: Trace LEE. Pulses: Radials 2+ and symmetric  Lab Results:   Basename 11/26/11 0730 11/24/11 1132   WBC 13.6* 13.4*  HGB 12.3 12.8  HCT 38.4 40.9  PLT 195 --   BMET  Basename 11/26/11 0730 11/25/11 0530 11/24/11 1529  NA 136 140 140  K 3.8 4.0 4.2  CL 96 98 100  CO2 29 31 29   GLUCOSE 102* 118* 148*  BUN 33* 25* 25*  CREATININE 1.44* 1.28* 1.27*  CALCIUM 8.8 8.8 8.9   PT/INR  Basename 11/26/11 0730 11/25/11 0530 11/24/11 1529  LABPROT 36.2* 30.9* 29.7*  INR 3.57* 2.91* 2.77*   Cholesterol  Basename 11/25/11 0530  CHOL 209*   Cardiac Enzymes No components found with this basename: TROPONIN:3, CKMB:3  Studies/Results: CHEST - 2 VIEW  Comparison: 11/24/2011  Findings: Prior median sternotomy. Cardiomegaly with diffuse  interstitial opacities, slightly worsened. Suspect mild  interstitial pulmonary edema, slightly progressed. Patchy focal  opacity in the right lower lobe, stable. No effusions. No acute  bony abnormality.  IMPRESSION:  Stable right lower lobe consolidation/pneumonia.  Increasing interstitial prominence, likely interstitial edema.    Assessment/Plan  Principal Problem:  *Pneumonia, Rt LL Active Problems:  Hypertension  Hyperlipidemia  GERD (gastroesophageal reflux disease)  Hyperthyroidism  Coronary artery disease, last cath 9/12-medical Rx  Mitral insufficiency due to HOCM and ruptured chorda tendina, 2D 11/12  Atrial fibrillation, attempted DCCV 11/12, put on Amio, found to be in NSR Jan 2013  Hypertrophic cardiomyopathy  Chronic anticoagulation  Acute on chronic diastolic congestive heart failure  Plan:  She looks better today.  Sitting up in the chair.  SR at 70bpm on the monitor.  Net fluids -700 in the last two days.  Scr increasing.  She was given IV lasix last night.  Bp stable.   LOS: 2 days    HAGER, BRYAN 11/26/2011 10:13 AM   Patient seen and examined. Agree with assessment and plan. Breathing better. Decreased BS RLL consistent with consolidation. Trace LE edema persists. No ectopy, maintaining sinus rhythm.   Lennette Bihari, MD, [facility] 11/26/2011 4:47 PM

## 2011-11-26 NOTE — Progress Notes (Signed)
I have seen and examined this patient. I have discussed with Dr Hunter.  I agree with their findings and plans as documented in their progress note for today.  

## 2011-11-27 LAB — BASIC METABOLIC PANEL
BUN: 34 mg/dL — ABNORMAL HIGH (ref 6–23)
CO2: 29 mEq/L (ref 19–32)
Chloride: 101 mEq/L (ref 96–112)
GFR calc Af Amer: 44 mL/min — ABNORMAL LOW (ref >90)
Glucose, Bld: 109 mg/dL — ABNORMAL HIGH (ref 70–99)
Potassium: 3.9 mEq/L (ref 3.5–5.1)

## 2011-11-27 MED ORDER — CEFUROXIME AXETIL 500 MG PO TABS
500.0000 mg | ORAL_TABLET | Freq: Two times a day (BID) | ORAL | Status: DC
  Administered 2011-11-27 – 2011-11-28 (×2): 500 mg via ORAL
  Filled 2011-11-27 (×4): qty 1

## 2011-11-27 NOTE — Progress Notes (Signed)
I have seen and examined this patient. I have discussed with Dr Clinton Sawyer.  I agree with their findings and plans as documented in their progress note for today.  Acute Issues 1. Pneumonia, community acquired - Pt with oxygen desaturation to 85% with exertion on room air.  Returns to >90% with Oxygen supplementation 2L/min via [place] at rest.  _Recommendations: -Complete oral antibiotic for total of 7 days effective antibiotics - Plan to discharge with Home Oxygen therapy, 2 Liter per minute via [place]: Patient prefers [employer] company to supply oxygen therapy as this is the company that supplies her husbands home oxygen.  - Continue patient's home Albuterol nebulized treatments as needed.  - Arrange Home Physical Therapy. - Anticipate discharge home tomorrow.

## 2011-11-27 NOTE — Evaluation (Signed)
Physical Therapy Evaluation Patient Details Name: Carla Black MRN: [id number] DOB: 05-25-1928 Today's Date: 11/27/2011 Time: 4098-1191 PT Time Calculation (min): 25 min  PT Assessment / Plan / Recommendation Clinical Impression  pt admitted with s/s of PNA a fib/RVR, CHF.  Most have resolved except PNA.Marland Kitchen  Activity tolerance is low with sats on RA with amb down to 85%. Recommend ST HHPT to recondition after D/C.    PT Assessment  Patient needs continued PT services    Follow Up Recommendations  Home health PT    Barriers to Discharge        lEquipment Recommendations  None recommended by PT    Recommendations for Other Services     Frequency Min 3X/week    Precautions / Restrictions     Pertinent Vitals/Pain Sats dropped to low of 85% on RA with amb.       Mobility  Bed Mobility Bed Mobility: Not assessed Transfers Transfers: Sit to Stand;Stand to Sit Sit to Stand: 6: Modified independent (Device/Increase time) Stand to Sit: 6: Modified independent (Device/Increase time) Details for Transfer Assistance: Supervision for safety only Ambulation/Gait Ambulation/Gait Assistance: 5: Supervision Ambulation Distance (Feet): 180 Feet (with 1 standing rest break) Assistive device: None Gait Pattern: Within Functional Limits Stairs: No    Exercises     PT Diagnosis: Other (comment) (decr activity tolerance)  PT Problem List: Decreased strength;Decreased activity tolerance PT Treatment Interventions: Gait training;Functional mobility training;Therapeutic activities;Patient/family education   PT Goals Acute Rehab PT Goals PT Goal Formulation: With patient Time For Goal Achievement: 12/04/11 Potential to Achieve Goals: Good Pt will Transfer Bed to Chair/Chair to Bed: Independently PT Transfer Goal: Bed to Chair/Chair to Bed - Progress: Goal set today Pt will Ambulate: >150 feet;with modified independence;with least restrictive assistive device PT Goal: Ambulate -  Progress: Goal set today  Visit Information  Last PT Received On: 11/27/11 Assistance Needed: +1    Subjective Data  Subjective: I can hear good except now with my head stopped up Patient Stated Goal: Home Independent   Prior Functioning  Home Living Lives With: Spouse Available Help at Discharge: Family Type of Home: Apartment Home Access: Level entry Home Layout: One level;Able to live on main level with bedroom/bathroom Bathroom Shower/Tub: Tub/shower unit;Curtain Bathroom Toilet: Handicapped height Home Adaptive Equipment: Grab bars around toilet;Grab bars in [employer] - four wheeled Communication Communication: No difficulties    Cognition  Overall Cognitive Status: Appears within functional limits for tasks assessed/performed Arousal/Alertness: Awake/alert Orientation Level: Oriented X4 / Intact Behavior During Session: [facility] for tasks performed    Extremity/Trunk Assessment Right Upper Extremity Assessment RUE ROM/Strength/Tone: Within functional levels Left Upper Extremity Assessment LUE ROM/Strength/Tone: Within functional levels Right Lower Extremity Assessment RLE ROM/Strength/Tone: Within functional levels Left Lower Extremity Assessment LLE ROM/Strength/Tone: Within functional levels (bil general weakness, but functional) Trunk Assessment Trunk Assessment: Normal   Balance Balance Balance Assessed:  (generally steady overall)  End of Session PT - End of Session Activity Tolerance: Patient tolerated treatment well Patient left: in chair;with call bell/phone within reach;with family/visitor present Nurse Communication: Mobility status   Devlyn Retter, Eliseo Gum 11/27/2011, 11:12 AM  11/27/2011  [place] Bing, PT [phone] (pager)

## 2011-11-27 NOTE — Care Management Note (Signed)
  Page 1 of 1   11/27/2011     10:03:12 AM   CARE MANAGEMENT NOTE 11/27/2011  Patient:  [facility]   Account Number:  [id number]  Date Initiated:  11/25/2011  Documentation initiated by:  Donn Pierini  Subjective/Objective Assessment:   Pt admitted with PNA, CHF     Action/Plan:   PTA pt lived with spouse in a ret. community.   Anticipated DC Date:  11/28/2011   Anticipated DC Plan:  HOME W HOME HEALTH SERVICES      DC Planning Services  CM consult      Choice offered to / List presented to:             Status of service:  In process, will continue to follow Medicare Important Message given?   (If response is "NO", the following Medicare IM given date fields will be blank) Date Medicare IM given:   Date Additional Medicare IM given:    Discharge Disposition:    Per UR Regulation:  Reviewed for med. necessity/level of care/duration of stay  If discussed at Long Length of Stay Meetings, dates discussed:    Comments:  PCPMerla Riches   11-27-11 [address], [place] [phone] Pt states she is from [facility]  IDL with husband. CM will continue ot monitor for disposition needs.  11/25/11- 1220- Donn Pierini RN, BSN [phone] UR completed

## 2011-11-27 NOTE — Progress Notes (Signed)
Daily Progress Note Carla Black. Carla Black, M.D., M.B.A  Family Medicine PGY-1 Pager [phone]  Patient name: Carla Black Medical record number: [id number] Date of birth: 05-26-1928 Age: 78 y.o. Gender: female Patient ID: Carla Black, female   DOB: [birth year], 78 y.o.   MRN: [id number]   Subjective: shortness of breath with ambulation and transfers; pleuritic chest pain associated with cough; right ear pain   Objective:  Temp:  [98.1 F (36.7 C)-98.4 F (36.9 C)] 98.1 F (36.7 C) (06/07 0444) Pulse Rate:  [65-72] 71  (06/07 0808) Resp:  [16-20] 16  (06/07 0808) BP: (120-155)/(38-65) 155/38 mmHg (06/07 0444) SpO2:  [93 %-98 %] 98 % (06/07 0808) Weight:  [189 lb 3.2 oz (85.821 kg)] 189 lb 3.2 oz (85.821 kg) (06/07 0444)  Intake/Output Summary (Last 24 hours) at 11/27/11 0825 Last data filed at 11/27/11 0400  Gross per 24 hour  Intake    360 ml  Output    903 ml  Net   -543 ml   Exam:  Constitutional: She is oriented to person, place, and time. She appears well-developed and well-nourished.  Head: Normocephalic and atraumatic.  Mouth/Throat: MMM No oropharyngeal exudate.  Eyes: Conjunctivae and EOM are normal. Pupils are equal, round, and reactive to light.  Ear: erythematous right TM but obscured by wax; scarring on left TM Neck: Normal range of motion. Neck supple.  Cardiovascular: Normal rate and regular rhythm. Murmur (3/6 SEM diffusely heard) heard.  Respiratory: slight bibasilar crackles. May be dependent. Intermittent wheeze heard. Not tachypneic. Able to complete sentences and not pause in between GI: Soft. Bowel sounds are normal. She exhibits no distension. There is no tenderness.  Musculoskeletal: Normal range of motion. She exhibits edema (1+ pitting edema. Tender in area she is edematous.  Neurological: She is alert and oriented to person, place, and time. No cranial nerve deficit.  Skin: Skin is warm and dry. She is not diaphoretic.    Labs and imaging:     CBC  Lab 11/26/11 0730 11/24/11 1132  WBC 13.6* 13.4*  HGB 12.3 12.8  HCT 38.4 40.9  PLT 195 --   BMET/CMET  Lab 11/27/11 0630 11/26/11 0730 11/25/11 0530 11/24/11 1529 11/24/11 1132  NA 140 136 140 -- --  K 3.9 3.8 4.0 -- --  CL 101 96 98 -- --  CO2 29 29 31  -- --  BUN 34* 33* 25* -- --  CREATININE 1.28* 1.44* 1.28* -- --  CALCIUM 8.6 8.8 8.8 -- --  PROT -- -- -- 7.8 6.9  BILITOT -- -- -- 0.6 1.0  ALKPHOS -- -- -- 92 83  ALT -- -- -- 20 17  AST -- -- -- 21 21  GLUCOSE 109* 102* 118* -- --   PRO B NATRIURETIC PEPTIDE     Status: Abnormal   Collection Time   11/24/11  3:29 PM      Component Value Range   Pro B Natriuretic peptide (BNP) 13052.0 (*) 0 - 450 (pg/mL)  PROTIME-INR     Status: Abnormal   Collection Time   11/24/11  3:29 PM      Component Value Range   Prothrombin Time 29.7 (*) 11.6 - 15.2 (seconds)   INR 2.77 (*) 0.00 - 1.49    No results found.   Assessment  Carla Black is a 78 y.o. year old female With a history of CAD, CHF diastolic, CHF as well as pneumonia in November which led to CHF and a fib presenting new  CAP in RLL and concern for CHF exacerbation.   1. CAP-patient not recently hospitalized or at risk for hospital acquired pneumonia; afebrile since admission   - continue antibiotics; CTX - day 4 and azithromycin - 3; switch to PO cephalosporin  -wean O2  will get PT involved today for early mobilization/assessment  -SOB may also be related to CHF-see problem #2.  -will continue albuterol per home regimen although no diagnosis COPD or asthma.  -mucinex 1200mg  BID   2. CHF-patient with a history of diastolic CHF. Most recent echo in November with EF 60-65%. Poor diastolic function possibly related to HOCM.  -s/p 2 doses of Lasix with little improvement in respiratory status and little diuresis. Cardiology thinks may be overloaded given BNP  - No diuresis since 6/5 in accordance with cardiology; none planned for today as patient no  appearing overloaded   -Must be gentle with diuresis given HOCM -daily weights and strict i/os. Weight down 4 lbs but only down 600 mL since admission.   -pro-BNP at 13k. Higher than values recorded before discharge on last admission.  -Mitral insufficiency due to HOCM and ruptured chorda tendina, 2D 11/12. Likely contributing to CHF.  -EKG with NSR. LBB read but qrs <120 so no BBB.   3. History of A fib with RVR-patient received cardioversion in November and converted to NSR but subsequently developed a fib again. Spontaneously reverted back to NSR after that time.  -currently in NSR -prn nitro and will reeval if CP develops  -will continue home meds including: amiodarone, diltiazem, metoprolol  -will also continue coumadin.  -will hold dose today as INR >3.5 and likely resume tomorrow.     4. Other CV-history of CAD and HOCM. No current chest pain, will not cycle CE at this time.   -HTN: Stable today-did have a SBP >180 x 1 yesterday AM. will continue ace inhibitor, ccb, beta blocker. Consider decrease BP meds as SBP very low today  -HLD- Patient reportedly intolerant to statins. LDL >130. Will defer discussion to PCP.  5. Hypothyroidism: continue synthroid. TSH wnl.   6. CKD STage III-stable. WIll want to be cautious with diuresis.   FEN/GI-heart healthy diet. Electrolytes stable today. AM BMET tomorrow. Continue home potassium of 10 meq. SLIV. Due to history of c diff, will continue home probiotics  PPx-sq heparin  Disposition-pending clinical improvement

## 2011-11-27 NOTE — Progress Notes (Signed)
[facility]  DAILY PROGRESS NOTE   Subjective:  Still coughing up thick yellow sputum. Pleuritic pain improved, almost gone. No arrhythmia. Doubt diastolic BP of 38 is real.  Objective:  Temp:  [98.1 F (36.7 C)-98.4 F (36.9 C)] 98.1 F (36.7 C) (06/07 0444) Pulse Rate:  [65-72] 71  (06/07 0808) Resp:  [16-20] 16  (06/07 0808) BP: (120-155)/(38-65) 155/38 mmHg (06/07 0444) SpO2:  [93 %-98 %] 98 % (06/07 0808) Weight:  [85.821 kg (189 lb 3.2 oz)] 85.821 kg (189 lb 3.2 oz) (06/07 0444) Weight change: -0.179 kg (-6.3 oz)  Intake/Output from previous day: 06/06 0701 - 06/07 0700 In: 720 [P.O.:720] Out: 1104 [Urine:1100; Stool:4]  Intake/Output from this shift: Total I/O In: 400 [P.O.:400] Out: -   Medications: Current Facility-Administered Medications  Medication Dose Route Frequency Provider Last Rate Last Dose  . 0.9 %  sodium chloride infusion  250 mL Intravenous PRN Shelva Majestic, MD      . albuterol (PROVENTIL) (5 MG/ML) 0.5% nebulizer solution 2.5 mg  2.5 mg Nebulization Q4H PRN Shelva Majestic, MD      . albuterol (PROVENTIL) (5 MG/ML) 0.5% nebulizer solution 2.5 mg  2.5 mg Nebulization TID Leighton Roach McDiarmid, MD   2.5 mg at 11/27/11 0808  . amiodarone (PACERONE) tablet 200 mg  200 mg Oral Daily Shelva Majestic, MD   200 mg at 11/26/11 0949  . antiseptic oral rinse (BIOTENE) solution 15 mL  15 mL Mouth Rinse BID Leighton Roach McDiarmid, MD   15 mL at 11/27/11 0814  . aspirin EC tablet 81 mg  81 mg Oral Daily Leighton Roach McDiarmid, MD   81 mg at 11/26/11 0948  . azithromycin (ZITHROMAX) tablet 250 mg  250 mg Oral Daily Shelva Majestic, MD   250 mg at 11/26/11 0949  . cefUROXime (CEFTIN) tablet 500 mg  500 mg Oral BID WC Garnetta Buddy, MD      . diltiazem [facility] CD) 24 hr capsule 180 mg  180 mg Oral Daily Shelva Majestic, MD   180 mg at 11/26/11 0947  . guaiFENesin (MUCINEX) 12 hr tablet 1,200 mg  1,200 mg Oral BID Shelva Majestic, MD    1,200 mg at 11/26/11 2141  . hydrALAZINE (APRESOLINE) injection 10 mg  10 mg Intravenous Q4H PRN Kerry Hough, PA   10 mg at 11/25/11 0531  . levothyroxine (SYNTHROID, LEVOTHROID) tablet 100 mcg  100 mcg Oral Q breakfast Shelva Majestic, MD   100 mcg at 11/27/11 [phone]  . lisinopril (PRINIVIL,ZESTRIL) tablet 20 mg  20 mg Oral Daily Leighton Roach McDiarmid, MD   20 mg at 11/26/11 0949  . metoprolol (LOPRESSOR) 150 mg  150 mg Oral BID Shelva Majestic, MD   150 mg at 11/26/11 2141  . nitroGLYCERIN (NITROSTAT) SL tablet 0.4 mg  0.4 mg Sublingual Q5 min PRN Shelva Majestic, MD      . potassium chloride (K-DUR,KLOR-CON) CR tablet 10 mEq  10 mEq Oral Daily Shelva Majestic, MD   10 mEq at 11/26/11 0949  . saccharomyces boulardii (FLORASTOR) capsule 250 mg  250 mg Oral Daily Leighton Roach McDiarmid, MD   250 mg at 11/26/11 0943  . sodium chloride 0.9 % injection 3 mL  3 mL Intravenous Q12H Shelva Majestic, MD   3 mL at 11/26/11 2140  . sodium chloride 0.9 % injection 3 mL  3 mL Intravenous PRN Shelva Majestic, MD      .  Warfarin - Physician Dosing Inpatient   Does not apply [id number] Leighton Roach McDiarmid, MD      . DISCONTD: albuterol (PROVENTIL HFA;VENTOLIN HFA) 108 (90 BASE) MCG/ACT inhaler 2 puff  2 puff Inhalation Q6H Shelva Majestic, MD   2 puff at 11/26/11 2120  . DISCONTD: cefTRIAXone (ROCEPHIN) 1 g in dextrose 5 % 50 mL IVPB  1 g Intravenous Q24H Shelva Majestic, MD   1 g at 11/26/11 1624  . DISCONTD: chlordiazePOXIDE (LIBRIUM) capsule 50 mg  50 mg Oral BID Leighton Roach McDiarmid, MD        Physical Exam: General appearance: alert, cooperative and no distress Neck: no adenopathy, no carotid bruit, no JVD, supple, symmetrical, trachea midline and thyroid not enlarged, symmetric, no tenderness/mass/nodules Lungs: rales posterior - right Heart: prominent apical impulse, regular rate and rhythm, S1, S2 normal, no S3 or S4 and systolic murmur: late systolic 3/6, harsh at 2nd left intercostal space, at 2nd right  intercostal space, at lower left sternal border, at apex Extremities: extremities normal, atraumatic, no cyanosis or edema Pulses: 2+ and symmetric Skin: Skin color, texture, turgor normal. No rashes or lesions Neurologic: Alert and oriented X 3, normal strength and tone. Normal symmetric reflexes. Normal coordination and gait  Lab Results: Results for orders placed during the hospital encounter of 11/24/11 (from the past 48 hour(s))  BASIC METABOLIC PANEL     Status: Abnormal   Collection Time   11/26/11  7:30 AM      Component Value Range Comment   Sodium 136  135 - 145 (mEq/L)    Potassium 3.8  3.5 - 5.1 (mEq/L)    Chloride 96  96 - 112 (mEq/L)    CO2 29  19 - 32 (mEq/L)    Glucose, Bld 102 (*) 70 - 99 (mg/dL)    BUN 33 (*) 6 - 23 (mg/dL)    Creatinine, Ser 6.04 (*) 0.50 - 1.10 (mg/dL)    Calcium 8.8  8.4 - 10.5 (mg/dL)    GFR calc non Af Amer 33 (*) >90 (mL/min)    GFR calc Af Amer 38 (*) >90 (mL/min)   PROTIME-INR     Status: Abnormal   Collection Time   11/26/11  7:30 AM      Component Value Range Comment   Prothrombin Time 36.2 (*) 11.6 - 15.2 (seconds)    INR 3.57 (*) 0.00 - 1.49    CBC     Status: Abnormal   Collection Time   11/26/11  7:30 AM      Component Value Range Comment   WBC 13.6 (*) 4.0 - 10.5 (K/uL)    RBC 4.00  3.87 - 5.11 (MIL/uL)    Hemoglobin 12.3  12.0 - 15.0 (g/dL)    HCT 54.0  98.1 - 19.1 (%)    MCV 96.0  78.0 - 100.0 (fL)    MCH 30.8  26.0 - 34.0 (pg)    MCHC 32.0  30.0 - 36.0 (g/dL)    RDW 47.8 (*) 29.5 - 15.5 (%)    Platelets 195  150 - 400 (K/uL)   DIFFERENTIAL     Status: Abnormal   Collection Time   11/26/11  7:30 AM      Component Value Range Comment   Neutrophils Relative 78 (*) 43 - 77 (%)    Neutro Abs 10.6 (*) 1.7 - 7.7 (K/uL)    Lymphocytes Relative 10 (*) 12 - 46 (%)    Lymphs Abs 1.4  0.7 -  4.0 (K/uL)    Monocytes Relative 11  3 - 12 (%)    Monocytes Absolute 1.5 (*) 0.1 - 1.0 (K/uL)    Eosinophils Relative 0  0 - 5 (%)     Eosinophils Absolute 0.1  0.0 - 0.7 (K/uL)    Basophils Relative 0  0 - 1 (%)    Basophils Absolute 0.0  0.0 - 0.1 (K/uL)   PROTIME-INR     Status: Abnormal   Collection Time   11/27/11  6:30 AM      Component Value Range Comment   Prothrombin Time 36.8 (*) 11.6 - 15.2 (seconds)    INR 3.64 (*) 0.00 - 1.49    BASIC METABOLIC PANEL     Status: Abnormal   Collection Time   11/27/11  6:30 AM      Component Value Range Comment   Sodium 140  135 - 145 (mEq/L)    Potassium 3.9  3.5 - 5.1 (mEq/L)    Chloride 101  96 - 112 (mEq/L)    CO2 29  19 - 32 (mEq/L)    Glucose, Bld 109 (*) 70 - 99 (mg/dL)    BUN 34 (*) 6 - 23 (mg/dL)    Creatinine, Ser 1.61 (*) 0.50 - 1.10 (mg/dL)    Calcium 8.6  8.4 - 10.5 (mg/dL)    GFR calc non Af Amer 38 (*) >90 (mL/min)    GFR calc Af Amer 44 (*) >90 (mL/min)     Imaging: No results found.  Assessment:  1. Principal Problem: 2.  *Pneumonia, Rt LL 3. Active Problems: 4.  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  Hypertension 6.  Hyperlipidemia 7.  GERD (gastroesophageal reflux disease) 8.  Hyperthyroidism 9.  Coronary artery disease, last cath 9/12-medical Rx 10.  Atrial fibrillation, attempted DCCV 11/12, put on Amio, found to be in NSR Jan 2013 11.  Hypertrophic cardiomyopathy 12.  Chronic anticoagulation 13.  Acute on chronic diastolic congestive heart failure 14.   Plan:  1. Improving pneumonia. No clinically overt CHF despite elevation in BNP. No atrial fibrillation. Doubt the DBP of 38 is real, but if BP is low recommend holding/reducing the ACE inhibitor and continuing betablocker and diltiazem.  Time Spent Directly with Patient:  30 minutes  Length of Stay:  LOS: 3 days    Julicia Krieger 11/27/2011, 10:35 AM

## 2011-11-27 NOTE — Progress Notes (Signed)
ANTICOAGULATION CONSULT NOTE - Initial Consult  Pharmacy Consult for Coumadin Indication: atrial fibrillation  Allergies  Allergen Reactions  . Metoprolol Other (See Comments)    Bradycardia  . Codeine Nausea Only    And dizziness  . Seldane (Terfenadine) Nausea Only    And dizziness  . Statins     myalgia   Labs:  Basename 11/27/11 0630 11/26/11 0730 11/25/11 0530  HGB -- 12.3 --  HCT -- 38.4 --  PLT -- 195 --  APTT -- -- --  LABPROT 36.8* 36.2* 30.9*  INR 3.64* 3.57* 2.91*  HEPARINUNFRC -- -- --  CREATININE 1.28* 1.44* 1.28*  CKTOTAL -- -- --  CKMB -- -- --  TROPONINI -- -- --    Estimated Creatinine Clearance: 36.2 ml/min (by C-G formula based on Cr of 1.28).   Medical History: Past Medical History  Diagnosis Date  . Hypertension   . Hyperlipidemia   . Heart murmur   . GERD (gastroesophageal reflux disease)   . Enlarged heart   . Arthritis   . Joint pain     left knee  . Dizziness   . Chest tightness 02/09/2008  . Hiatal hernia   . CHF (congestive heart failure)   . Carotid artery occlusion   . Shortness of breath   . Angina   . Hyperthyroidism   . Cancer   . Coronary artery disease   . Pneumonia   . Hypertrophic cardiomyopathy     subvalvular gradient 45-mm  . Dysrhythmia     hx of atrial fibrilation  . Stroke     hx of TIA   Assessment: On Coumadin PTA for Afib Dose PTA = 2.5 mg daily  Goal of Therapy:  INR 2-3 Monitor platelets by anticoagulation protocol: Yes   Plan:  1) No Coumadin today 2) Follow up AM INR  Thank you. Okey Regal, PharmD [phone]  11/27/2011,12:26 PM

## 2011-11-27 NOTE — Progress Notes (Signed)
SATURATION QUALIFICATIONS:  Patient Saturations on Room Air at Rest =  93%  Patient Saturations on Room Air while Ambulating = 84 %  Patient Saturations on 2  Liters of oxygen while Ambulating = 94 %

## 2011-11-28 LAB — PROTIME-INR
INR: 3.31 — ABNORMAL HIGH (ref 0.00–1.49)
Prothrombin Time: 34.1 seconds — ABNORMAL HIGH (ref 11.6–15.2)

## 2011-11-28 MED ORDER — LEVALBUTEROL HCL 0.31 MG/3ML IN NEBU: 1.0000 | INHALATION_SOLUTION | RESPIRATORY_TRACT | Status: DC | PRN

## 2011-11-28 MED ORDER — CEFUROXIME AXETIL 500 MG PO TABS: 500.0000 mg | ORAL_TABLET | Freq: Two times a day (BID) | ORAL | Status: AC

## 2011-11-28 NOTE — Progress Notes (Signed)
I have seen and examined this patient. I have discussed with Dr Tye Savoy.  I agree with their findings and plans as documented in their progress note.

## 2011-11-28 NOTE — Discharge Summary (Signed)
Physician Discharge Summary  Patient ID: Carla Black MRN: [id number] DOB: 16-Nov-1927 Age: 78 y.o.  Admit date: 11/24/2011 Discharge date: 11/28/2011  PCP: Tonye Pearson, MD, MD  Consultants:[facility]   Discharge Diagnosis: Principal Problem:  *Pneumonia, Rt LL Active Problems:  Hypertension  Hyperlipidemia  GERD (gastroesophageal reflux disease)  Hyperthyroidism  Coronary artery disease, last cath 9/12-medical Rx  Mitral insufficiency due to HOCM and ruptured chorda tendina, 2D 11/12  Atrial fibrillation, attempted DCCV 11/12, put on Amio, found to be in NSR Jan 2013  Hypertrophic cardiomyopathy  Chronic anticoagulation  Acute on chronic diastolic congestive heart failure    Hospital Course Carla Black is a 78 y.o. year old female With a history of CAD, CHF diastolic, CHF as well as pneumonia in November which led to CHF and a fib presenting with community acquired pneumonia (CAP)  1. CAP-started on ceftriaxone and azithromycin at time of admission.   - Afebrile since admission.   - Continue for 10 day total antibiotics - Ceftin 500 daily, Zithromax 250 daily (5 days).   - Continue albuterol per home regimen and Mucinex although no diagnosis COPD or asthma. Patient wasdischarged with home o2.   -Patient had a new oxygen requirement of 2L.   - PT recommended Home Health PT.   2. CHF-patient with a history of diastolic CHF. Most recent echo in November with EF 60-65%. Poor diastolic function possibly related to HOCM. BNP 13,000. 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 Likely contributing to CHF. EKG with NSR. LBB read but qrs <120 so no BBB.   - Due to high BNP, attempted diuresis. s/p 2 doses of Lasix with little improvement in respiratory status and little diuresis.   -Cardiology did not feel that patient was fluid overloaded and recommended she go home on home diuretic.   - Daily weights and strict i/os. Weight  down 4 lbs but only down 500 mL since admission.   3. History of A fib with RVR-patient received cardioversion in November and converted to NSR but subsequently developed a fib again. Spontaneously reverted back to NSR after that time.   -  NSR. Throughout hospitalization  - Continued home meds including: amiodarone, diltiazem, metoprolol.   - Coumadin per pharmacy. Held at time of discharge due to INR >3. Needs follow up on 6/10.   4. History of CAD and HOCM. No current chest pain, will not cycle CE at this time.   - HTN: Stable today; continude ace inhibitor, ccb, beta blocker.   - HLD- Patient reportedly intolerant to statins. LDL >130. Will defer discussion to PCP.  5. Hypothyroidism: continued synthroid. TSH wnl.  6. CKD STage III - Creatinine at baseline.    Procedures/Imaging:  X-ray Chest Pa And Lateral   11/25/2011  *RADIOLOGY REPORT*  Clinical Data: Shortness of breath, cough, pneumonia.  CHEST - 2 VIEW  Comparison: 11/24/2011  Findings: Prior median sternotomy.  Cardiomegaly with diffuse interstitial opacities, slightly worsened.  Suspect mild interstitial pulmonary edema, slightly progressed.  Patchy focal opacity in the right lower lobe, stable.  No effusions.  No acute bony abnormality.  IMPRESSION: Stable right lower lobe consolidation/pneumonia.  Increasing interstitial prominence, likely interstitial edema.  Original Report Authenticated By: Cyndie Chime, M.D.   Dg Chest 2 View  11/24/2011  *RADIOLOGY REPORT*  Clinical Data: Severe shortness of breath.  Cough.  CHEST - 2 VIEW  Comparison: Portable chest x-ray 11/10//2012 [facility], two-view chest x-ray 04/25/2011, 01/08/2010  [facility].  Findings: Prior sternotomy for CABG.  Cardiac silhouette moderately enlarged but stable. Localized airspace consolidation in the superior segment right lower lobe.  Mild diffuse interstitial pulmonary edema, new since the prior examinations.  No pleural effusions.   Visualized bony thorax intact with slight exaggeration of the usual kyphosis.  Lateral image blurred by respiratory motion.  IMPRESSION: Pneumonia involving the superior segment of the right lower lobe, superimposed upon mild interstitial pulmonary edema.  Clinically significant discrepancy from primary report, if provided: None  Original Report Authenticated By: Arnell Sieving, M.D.   Labs  CBC  Lab 11/26/11 0730 11/24/11 1132  WBC 13.6* 13.4*  HGB 12.3 12.8  HCT 38.4 40.9  PLT 195 --   BMET  Lab 11/27/11 0630 11/26/11 0730 11/25/11 0530  NA 140 136 140  K 3.9 3.8 4.0  CL 101 96 98  CO2 29 29 31   BUN 34* 33* 25*  CREATININE 1.28* 1.44* 1.28*  CALCIUM 8.6 8.8 8.8  PROT -- -- --  BILITOT -- -- --  ALKPHOS -- -- --  ALT -- -- --  AST -- -- --  GLUCOSE 109* 102* 118*        Patient condition at time of discharge/disposition: stable with new home oxygen requirement and HHPT  Disposition-home   Follow up issues: 1. Follow up respiratory status after recent pneumonia.  2. FOllow up fluid status, did not appear fluid overloaded during hospitalization.  3. FOllow up INR as supratherapeutic on admission and held at time of discharge.   Lab 11/28/11 0916 11/27/11 0630 11/26/11 0730  INR 3.31* 3.64* 3.57*    4. LDL >130. Deferred discussion to PCP as history of statin intolerance per patient.   Discharge follow up:  Follow-up Information    Schedule an appointment as soon as possible for a visit with LITTLE, Thereasa Solo, MD. [facility] Follow-Up)    Contact information:   [address] [place] [address] [phone]       Call HOPPER,DAVID, MD. (INR check on Monday 11/30/11)    Contact information:   [address] [place] [address] [phone]         Discharge Orders    Future Appointments: Provider: Department: Dept Phone: Center:   09/21/2012 3:00 PM [facility] [phone] [facility]   09/21/2012 4:00 PM  Chuck Hint, MD [facility] [phone] [facility]      Discharge Medications  Roshanna, Cimino  Home Medication Instructions [id number]   Printed on:11/28/11 1904  Medication Information                    Probiotic Product (ALIGN PO) Take 4 mg by mouth every morning.            aspirin 81 MG tablet Take 81 mg by mouth daily.            nitroGLYCERIN (NITROSTAT) 0.4 MG SL tablet Place 0.4 mg under the tongue every 5 (five) minutes as needed. For chest pain           Omega 3-6-9 Fatty Acids (OMEGA 3-6-9 COMPLEX PO) Take 1 capsule by mouth 2 (two) times daily.            Multiple Vitamins-Minerals (ICAPS AREDS FORMULA PO) Take 1 capsule by mouth 2 (two) times daily.            guaiFENesin (MUCINEX) 600 MG 12 hr tablet Take 1 tablet (600 mg total)  by mouth 2 (two) times daily.           amiodarone (PACERONE) 200 MG tablet Take 200 mg by mouth every morning.            diltiazem (CARDIZEM CD) 180 MG 24 hr capsule Take 180 mg by mouth daily.            furosemide (LASIX) 40 MG tablet Take 20 mg by mouth daily as needed. For fluid retention            warfarin (COUMADIN) 2.5 MG tablet Take 2.5 mg by mouth every evening. 1.5 (3.75mg ) tabs everyday except on Fridays and mondays pt take 1 tab (total 2.5mg )           metoprolol (LOPRESSOR) 100 MG tablet Take 150 mg by mouth 2 (two) times daily.             potassium chloride SA (K-DUR,KLOR-CON) 20 MEQ tablet Take 10 mEq by mouth daily. When taking furosemide            levothyroxine (SYNTHROID, LEVOTHROID) 100 MCG tablet Take 1 tablet (100 mcg total) by mouth daily.           albuterol (PROVENTIL HFA;VENTOLIN HFA) 108 (90 BASE) MCG/ACT inhaler Inhale 2 puffs into the lungs every 6 (six) hours as needed for wheezing.           quinapril (ACCUPRIL) 20 MG tablet Take 20 mg by mouth daily.           cefUROXime (CEFTIN) 500 MG tablet Take 1 tablet (500 mg total) by mouth 2 (two) times daily with a meal.            levalbuterol (XOPENEX) 0.31 MG/3ML nebulizer solution Take 3 mLs (0.31 mg total) by nebulization every 4 (four) hours as needed for wheezing or shortness of breath.                Tana Conch, MD of [facility] 11/28/2011 7:04 PM

## 2011-11-28 NOTE — Discharge Instructions (Signed)
Your INR level today was elevated at 3.31 (needs to be between 2.0-3.0). DO NOT TAKE Coumadin this weekend. Please go to [facility] on Monday 11/30/11 to have INR level checked. Please schedule an appointment with Dr. Clarene Duke for hospital follow up within 5-7 days. If you develop difficulty breathing/shortness of breath, worsening cough, chest pain, or worsening leg swelling, please call MD or go to ED.

## 2011-11-28 NOTE — Progress Notes (Signed)
[facility]  Subjective:   Objective: Vital signs in last 24 hours: Temp:  [97.1 F (36.2 C)-97.5 F (36.4 C)] 97.4 F (36.3 C) (06/08 0530) Pulse Rate:  [62-74] 73  (06/08 0947) Resp:  [13-17] 16  (06/08 0530) BP: (131-146)/(55-73) 146/65 mmHg (06/08 0947) SpO2:  [93 %-99 %] 96 % (06/08 0828) Weight:  [86.138 kg (189 lb 14.4 oz)] 86.138 kg (189 lb 14.4 oz) (06/08 1016) Last BM Date: 11/27/11  Intake/Output from previous day: 06/07 0701 - 06/08 0700 In: 1000 [P.O.:1000] Out: 401 [Urine:400; Stool:1] Intake/Output this shift: Total I/O In: 3 [I.V.:3] Out: -   Medications Current Facility-Administered Medications  Medication Dose Route Frequency Provider Last Rate Last Dose  . 0.9 %  sodium chloride infusion  250 mL Intravenous PRN Shelva Majestic, MD      . albuterol (PROVENTIL) (5 MG/ML) 0.5% nebulizer solution 2.5 mg  2.5 mg Nebulization Q4H PRN Shelva Majestic, MD   2.5 mg at 11/28/11 0417  . albuterol (PROVENTIL) (5 MG/ML) 0.5% nebulizer solution 2.5 mg  2.5 mg Nebulization TID Leighton Roach McDiarmid, MD   2.5 mg at 11/28/11 0827  . amiodarone (PACERONE) tablet 200 mg  200 mg Oral Daily Shelva Majestic, MD   200 mg at 11/28/11 0950  . antiseptic oral rinse (BIOTENE) solution 15 mL  15 mL Mouth Rinse BID Leighton Roach McDiarmid, MD   15 mL at 11/27/11 0814  . aspirin EC tablet 81 mg  81 mg Oral Daily Leighton Roach McDiarmid, MD   81 mg at 11/28/11 0950  . azithromycin (ZITHROMAX) tablet 250 mg  250 mg Oral Daily Shelva Majestic, MD   250 mg at 11/28/11 0950  . cefUROXime (CEFTIN) tablet 500 mg  500 mg Oral BID WC Garnetta Buddy, MD   500 mg at 11/28/11 0748  . diltiazem (CARDIZEM CD) 24 hr capsule 180 mg  180 mg Oral Daily Shelva Majestic, MD   180 mg at 11/28/11 0950  . guaiFENesin (MUCINEX) 12 hr tablet 1,200 mg  1,200 mg Oral BID Shelva Majestic, MD   1,200 mg at 11/28/11 0947  . hydrALAZINE (APRESOLINE) injection 10 mg  10 mg Intravenous Q4H PRN  Kerry Hough, PA   10 mg at 11/25/11 0531  . levothyroxine (SYNTHROID, LEVOTHROID) tablet 100 mcg  100 mcg Oral Q breakfast Shelva Majestic, MD   100 mcg at 11/28/11 0748  . lisinopril (PRINIVIL,ZESTRIL) tablet 20 mg  20 mg Oral Daily Leighton Roach McDiarmid, MD   20 mg at 11/28/11 0948  . metoprolol (LOPRESSOR) 150 mg  150 mg Oral BID Shelva Majestic, MD   150 mg at 11/28/11 0948  . nitroGLYCERIN (NITROSTAT) SL tablet 0.4 mg  0.4 mg Sublingual Q5 min PRN Shelva Majestic, MD      . potassium chloride (K-DUR,KLOR-CON) CR tablet 10 mEq  10 mEq Oral Daily Shelva Majestic, MD   10 mEq at 11/28/11 0947  . saccharomyces boulardii (FLORASTOR) capsule 250 mg  250 mg Oral Daily Leighton Roach McDiarmid, MD   250 mg at 11/28/11 0950  . sodium chloride 0.9 % injection 3 mL  3 mL Intravenous Q12H Shelva Majestic, MD   3 mL at 11/28/11 0950  . sodium chloride 0.9 % injection 3 mL  3 mL Intravenous PRN Shelva Majestic, MD      . DISCONTD: Warfarin - Physician Dosing Inpatient   Does not apply q1800 Tawanna Cooler  D McDiarmid, MD       PE General appearance: alert, cooperative and no distress  Lungs: CTA bilaterally, No Wheeze/rhonchi  Heart: regular rate and rhythm and 2/6 systolic MM  Extremities: Trace LEE.  Pulses: Radials 2+ and symmetric   Lab Results:   Basename 11/26/11 0730  WBC 13.6*  HGB 12.3  HCT 38.4  PLT 195   BMET  Basename 11/27/11 0630 11/26/11 0730  NA 140 136  K 3.9 3.8  CL 101 96  CO2 29 29  GLUCOSE 109* 102*  BUN 34* 33*  CREATININE 1.28* 1.44*  CALCIUM 8.6 8.8   PT/INR  Basename 11/28/11 0916 11/27/11 0630 11/26/11 0730  LABPROT 34.1* 36.8* 36.2*  INR 3.31* 3.64* 3.57*    Assessment/Plan  Principal Problem:  *Pneumonia, Rt LL Active Problems:  Hypertension  Hyperlipidemia  GERD (gastroesophageal reflux disease)  Hyperthyroidism  Coronary artery disease, last cath 9/12-medical Rx  Mitral insufficiency due to HOCM and ruptured chorda tendina, 2D 11/12  Atrial  fibrillation, attempted DCCV 11/12, put on Amio, found to be in NSR Jan 2013  Hypertrophic cardiomyopathy  Chronic anticoagulation  Acute on chronic diastolic congestive heart failure  Plan:  Improving. Resume Home dosing of Lasix at DC.  Will schedule FU appt.     LOS: 4 days    HAGER, BRYAN 11/28/2011 11:32 AM   Patient seen and examined. Agree with assessment and plan. Breathing better; less cough and sputum production.  Still with mild LE edema; to resume home dose of furosemide at DC. F/U with Dr. Clarene Duke in our office as outpatient.   Lennette Bihari, MD, [facility] 11/28/2011 11:47 AM

## 2011-11-28 NOTE — Progress Notes (Signed)
Subjective: Patient is sitting comfortably in recliner.  Oxygen sat. 96% on 2 L. She complains of shortness of breath exertion, but this is a chronic issue. Still coughing up phlegm, but denies any chest pain at this time. Her lower extremity edema is also improving per patient and daughter.  Objective: Vital signs in last 24 hours: Temp:  [97.1 F (36.2 C)-97.5 F (36.4 C)] 97.4 F (36.3 C) (06/08 0530) Pulse Rate:  [62-74] 62  (06/08 0530) Resp:  [13-17] 16  (06/08 0530) BP: (131-143)/(55-73) 131/55 mmHg (06/08 0530) SpO2:  [85 %-99 %] 96 % (06/08 0828) Weight change:  Last BM Date: 11/27/11  Intake/Output from previous day: 06/07 0701 - 06/08 0700 In: 1000 [P.O.:1000] Out: 401 [Urine:400; Stool:1] Intake/Output this shift:   Physical Exam: General appearance: alert, cooperative and no distress Neck: no JVD and supple, symmetrical, trachea midline Breasts: normal appearance, no masses or tenderness Cardio: regular rate and rhythm and 3/6 SEM GI: soft, non-tender; bowel sounds normal; no masses,  no organomegaly Extremities: edema from feet to ankles bilaterally, about 2+ pitting. Skin: Skin color, texture, turgor normal. No rashes or lesions Neurologic: Grossly normal  Lab Results:  Basename 11/26/11 0730  WBC 13.6*  HGB 12.3  HCT 38.4  PLT 195   BMET  Basename 11/27/11 0630 11/26/11 0730  NA 140 136  K 3.9 3.8  CL 101 96  CO2 29 29  GLUCOSE 109* 102*  BUN 34* 33*  CREATININE 1.28* 1.44*  CALCIUM 8.6 8.8    Studies/Results: No results found.  Medications: I have reviewed the patient's current medications.  Assessment/Plan: Carla Black is a 78 y.o. year old female With a history of CAD, CHF diastolic, CHF as well as pneumonia in November which led to CHF and a fib presenting new CAP in RLL and concern for CHF exacerbation.   1. CAP- Afebrile since admission. - Continue for 10 day antibiotics - Ceftin 500 daily, Zithromax 250 daily. - PT  recommended Home Health PT. - Continue albuterol per home regimen and Mucinex although no diagnosis COPD or asthma.  - Find out which breathing treatments patient should go home with  2. CHF-patient with a history of diastolic CHF. Most recent echo in November with EF 60-65%. Poor diastolic function possibly related to HOCM.  BNP 13,000.  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 Likely contributing to CHF.  EKG with NSR. LBB read but qrs <120 so no BBB.  - s/p 2 doses of Lasix with little improvement in respiratory status and little diuresis.  - Daily weights and strict i/os. Weight down 4 lbs but only down 600 mL since admission.  - Follow up Cardiology recommendations for further diuresis and disposition  3. History of A fib with RVR-patient received cardioversion in November and converted to NSR but subsequently developed a fib again. Spontaneously reverted back to NSR after that time.  - Currently in NSR. - Continue home meds including: amiodarone, diltiazem, metoprolol. - Coumadin per pharmacy.  INR yesterday 3.64, now 3.31.   - Will need INR checked on Monday.  4. History of CAD and HOCM. No current chest pain, will not cycle CE at this time.  - HTN: Stable today; continue ace inhibitor, ccb, beta blocker.  If DBP continues to be low, will decrease doses. - HLD- Patient reportedly intolerant to statins. LDL >130. Will defer discussion to PCP.   5. Hypothyroidism: continue synthroid. TSH wnl.   6. CKD STage III -  Creatinine at baseline.  FEN/GI: Heart healthy diet. SLIV.  PPx: SQ heparin. Disposition: pending clinical improvement and Cardiology recommendations.   LOS: 4 days   DE LA CRUZ,Arzu Mcgaughey 11/28/2011, 9:07 AM

## 2011-11-28 NOTE — Progress Notes (Signed)
ANTICOAGULATION CONSULT NOTE - Follow Up Consult  Pharmacy Consult for Coumadin Indication: atrial fibrillation  Allergies  Allergen Reactions  . Metoprolol Other (See Comments)    Bradycardia  . Codeine Nausea Only    And dizziness  . Seldane (Terfenadine) Nausea Only    And dizziness  . Statins     myalgia   Labs:  Basename 11/28/11 0916 11/27/11 0630 11/26/11 0730  HGB -- -- 12.3  HCT -- -- 38.4  PLT -- -- 195  APTT -- -- --  LABPROT 34.1* 36.8* 36.2*  INR 3.31* 3.64* 3.57*  HEPARINUNFRC -- -- --  CREATININE -- 1.28* 1.44*  CKTOTAL -- -- --  CKMB -- -- --  TROPONINI -- -- --    Estimated Creatinine Clearance: 36.2 ml/min (by C-G formula based on Cr of 1.28).  Assessment: On Coumadin PTA for Afib Dose PTA = 2.5 mg daily INR still elevated  Goal of Therapy:  INR 2-3 Monitor platelets by anticoagulation protocol: Yes   Plan:  1) No Coumadin today 2) Follow up AM INR  Thank you. Okey Regal, PharmD [phone]  11/28/2011,10:32 AM

## 2011-11-28 NOTE — Plan of Care (Signed)
Problem: Discharge Progression Outcomes Goal: O2 sats at patient's baseline Outcome: Adequate for Discharge Pt. Going home on O2 with [employer]  Problem: Phase I Progression Outcomes Goal: EF % per last Echo/documented,Core Reminder form on chart Outcome: Completed/Met Date Met:  11/28/11 EF 45-50% 11-12 2DEcho

## 2011-11-29 NOTE — Discharge Summary (Signed)
I have seen and examined this patient. I have discussed with Dr Hunter.  I agree with their findings and plans as documented in their progress note.    

## 2011-11-30 ENCOUNTER — Encounter: Payer: Self-pay | Admitting: Internal Medicine

## 2011-12-04 ENCOUNTER — Ambulatory Visit (INDEPENDENT_AMBULATORY_CARE_PROVIDER_SITE_OTHER): Payer: Medicare Other | Admitting: Internal Medicine

## 2011-12-04 VITALS — BP 178/67 | HR 61 | Temp 97.8°F | Resp 16

## 2011-12-04 DIAGNOSIS — R0609 Other forms of dyspnea: Secondary | ICD-10-CM

## 2011-12-04 DIAGNOSIS — Z9981 Dependence on supplemental oxygen: Secondary | ICD-10-CM

## 2011-12-04 DIAGNOSIS — H6120 Impacted cerumen, unspecified ear: Secondary | ICD-10-CM

## 2011-12-04 DIAGNOSIS — H9190 Unspecified hearing loss, unspecified ear: Secondary | ICD-10-CM

## 2011-12-04 DIAGNOSIS — R42 Dizziness and giddiness: Secondary | ICD-10-CM

## 2011-12-04 NOTE — Progress Notes (Signed)
Subjective:    Patient ID: Carla Black, female    DOB: 05/03/1928, 78 y.o.   MRN: [id number]  HPIDischarged [place] 11/28/2011 following treatment for respiratory distress and pneumonia Cardiology felt congestive heart failure was not part of this despite elevated BNP She was sent home on 2 L of oxygen for the first time that portable oxygen was required There is no history of chronic lung disease She had an aortic valve replacement and remains on Coumadin She is known to have a cardiomyopathy with no recent ejection fraction She has peripheral edema which has not been excessive taking daily Lasix She continues with shortness of breath with activity Since hospitalization she continues to sleep sitting up She doesn't feel the oxygen is very effective and she was giving an oxygen canister which she cannot manage due to her need for a walker    Review of SystemsNo fever chills or night sweats No weight loss Appetite diminished but improving since hospitalization Denies sinus congestion or discharge/Does note hearing loss in the right ear with a feeling of pressure for several weeks Feels dizzy when she changes positions at times and thinks these 2 were connected Cough mild with clear sputum occasional No palpitations or chest pain  No abdominal pain No change in peripheral edema that remains mild No change in sensorium   PMH: Patient Active Problem List  Diagnosis  . Hypertension  . Hyperlipidemia  . GERD (gastroesophageal reflux disease)  . Hyperthyroidism  . Coronary artery disease, last cath 9/12-medical Rx  . Mitral insufficiency due to HOCM and ruptured chorda tendina, 2D 11/12  . Hypothyroid  . Pneumonia, Rt LL  . Atrial fibrillation, attempted DCCV 11/12, put on Amio, found to be in NSR Jan 2013  . Hypertrophic cardiomyopathy  . Chronic anticoagulation  . Acute on chronic diastolic congestive heart failure   Current outpatient prescriptions:albuterol  (PROVENTIL HFA;VENTOLIN HFA) 108 (90 BASE) MCG/ACT inhaler, Inhale 2 puffs into the lungs every 6 (six) hours as needed for wheezing., Disp: 1 Inhaler, Rfl: 3;  amiodarone (PACERONE) 200 MG tablet, Take 200 mg by mouth every morning. , Disp: , Rfl: ;  aspirin 81 MG tablet, Take 81 mg by mouth daily. , Disp: , Rfl: ;  diltiazem (CARDIZEM CD) 180 MG 24 hr capsule, Take 180 mg by mouth daily. , Disp: , Rfl:  furosemide (LASIX) 40 MG tablet, Take 20 mg by mouth daily as needed. For fluid retention , Disp: , Rfl: ;  guaiFENesin (MUCINEX) 600 MG 12 hr tablet, Take 1 tablet (600 mg total) by mouth 2 (two) times daily., Disp: , Rfl: ;  levalbuterol (XOPENEX) 0.31 MG/3ML nebulizer solution, Take 3 mLs (0.31 mg total) by nebulization every 4 (four) hours as needed for wheezing or shortness of breath., Disp: 3 mL, Rfl: 11 levothyroxine (SYNTHROID, LEVOTHROID) 100 MCG tablet, Take 1 tablet (100 mcg total) by mouth daily., Disp: 90 tablet, Rfl: 3;  metoprolol (LOPRESSOR) 100 MG tablet, Take 150 mg by mouth 2 (two) times daily.  , Disp: , Rfl: ;  Multiple Vitamins-Minerals (ICAPS AREDS FORMULA PO), Take 1 capsule by mouth 2 (two) times daily. , Disp: , Rfl:  nitroGLYCERIN (NITROSTAT) 0.4 MG SL tablet, Place 0.4 mg under the tongue every 5 (five) minutes as needed. For chest pain, Disp: , Rfl: ;  Omega 3-6-9 Fatty Acids (OMEGA 3-6-9 COMPLEX PO), Take 1 capsule by mouth 2 (two) times daily. , Disp: , Rfl: ;  potassium chloride SA (K-DUR,KLOR-CON) 20 MEQ tablet, Take  10 mEq by mouth daily. When taking furosemide , Disp: , Rfl:  Probiotic Product (ALIGN PO), Take 4 mg by mouth every morning. , Disp: , Rfl: ;  quinapril (ACCUPRIL) 20 MG tablet, Take 20 mg by mouth daily., Disp: , Rfl: ;  warfarin (COUMADIN) 2.5 MG tablet, Take 2.5 mg by mouth every evening. 1.5 (3.75mg ) tabs everyday except on Fridays and mondays pt take 1 tab (total 2.5mg ), Disp: , Rfl:  cefUROXime (CEFTIN) 500 MG tablet, Take 1 tablet (500 mg total) by mouth 2  (two) times daily with a meal., Disp: 10 tablet, Rfl: 0;  DISCONTD: amiodarone (PACERONE) 200 MG tablet, Take 1 tablet (200 mg total) by mouth daily., Disp: 30 tablet, Rfl: 5;  DISCONTD: diltiazem (CARDIZEM CD) 180 MG 24 hr capsule, Take 1 capsule (180 mg total) by mouth daily., Disp: 30 capsule, Rfl: 5 DISCONTD: metoprolol (LOPRESSOR) 100 MG tablet, Take 1 tablet (100 mg total) by mouth 2 (two) times daily., Disp: 60 tablet, Rfl: 5;  DISCONTD: warfarin (COUMADIN) 2.5 MG tablet, Take 1 tablet (2.5 mg total) by mouth once., Disp: 30 tablet, Rfl: 5      Objective:   Physical Exam Systolic 178 Alert and oriented in no acute distress with 3 L of oxygen HEENT clear Except right ear has cerumen impaction Heart regular with a rate of 62 Grade 2-3/6 holosys murmer/ no gallop/carotid bruit on left/ Lungs clear Trace to 1+ peripheral edema Peripheral pulses Full Neurological with intact cranial nerves/Romberg negative/no peripheral sensory losses  Pulse ox on 3 L O2 93 Pulse ox on 2 L O2 92 Pulse ox on room air 89-90 rising to 93 with deep breathing   Procedure: irrigation of the right ear canal resulted in complete removal of wax Posterior duration of the scope equals all clear     Assessment & Plan:  Problem #1 recent pneumonia resolving Problem #2 overall fatigue Problem #3 hypoxia-this is due to undiagnosed chronic lung disease/his is secondary to cardiomyopathy/is this related to Metaprolol not allowing a faster heart rate/  is  She a candidate for pulmonary rehabilitation/? secondary to generalized deconditioning Problem #4 hypertension uncontrolled Problem #5 hearing loss secondary to cerumen impaction Problem #5 other problems as noted in past medical history  Plan -2L oxygen at rest- 3L w/activity appt Dr little -cardiology 6/25 [facility] to change machine to one she can manage reray 1 month-?pulm consult if next ECHO stable

## 2011-12-07 ENCOUNTER — Other Ambulatory Visit: Payer: Self-pay | Admitting: *Deleted

## 2011-12-22 ENCOUNTER — Ambulatory Visit
Admission: RE | Admit: 2011-12-22 | Discharge: 2011-12-22 | Disposition: A | Discharge status: Home / Self Care | Payer: Medicare Other | Source: Ambulatory Visit | Attending: Cardiology | Admitting: Cardiology

## 2011-12-22 ENCOUNTER — Other Ambulatory Visit: Payer: Self-pay | Admitting: Cardiology

## 2011-12-22 DIAGNOSIS — J189 Pneumonia, unspecified organism: Secondary | ICD-10-CM

## 2012-05-05 ENCOUNTER — Other Ambulatory Visit (HOSPITAL_COMMUNITY): Payer: Self-pay

## 2012-05-05 DIAGNOSIS — I059 Rheumatic mitral valve disease, unspecified: Secondary | ICD-10-CM

## 2012-05-05 DIAGNOSIS — I4891 Unspecified atrial fibrillation: Secondary | ICD-10-CM

## 2012-05-05 DIAGNOSIS — R0602 Shortness of breath: Secondary | ICD-10-CM

## 2012-05-17 ENCOUNTER — Ambulatory Visit (HOSPITAL_COMMUNITY)
Admission: RE | Admit: 2012-05-17 | Discharge: 2012-05-17 | Disposition: A | Discharge status: Home / Self Care | Payer: Medicare Other | Source: Ambulatory Visit | Attending: Cardiovascular Disease | Admitting: Cardiovascular Disease

## 2012-05-17 DIAGNOSIS — I10 Essential (primary) hypertension: Secondary | ICD-10-CM | POA: Insufficient documentation

## 2012-05-17 DIAGNOSIS — R0609 Other forms of dyspnea: Secondary | ICD-10-CM | POA: Insufficient documentation

## 2012-05-17 DIAGNOSIS — I4891 Unspecified atrial fibrillation: Secondary | ICD-10-CM

## 2012-05-17 DIAGNOSIS — I079 Rheumatic tricuspid valve disease, unspecified: Secondary | ICD-10-CM | POA: Insufficient documentation

## 2012-05-17 DIAGNOSIS — R0989 Other specified symptoms and signs involving the circulatory and respiratory systems: Secondary | ICD-10-CM | POA: Insufficient documentation

## 2012-05-17 DIAGNOSIS — R0602 Shortness of breath: Secondary | ICD-10-CM

## 2012-05-17 DIAGNOSIS — E119 Type 2 diabetes mellitus without complications: Secondary | ICD-10-CM | POA: Insufficient documentation

## 2012-05-17 DIAGNOSIS — I080 Rheumatic disorders of both mitral and aortic valves: Secondary | ICD-10-CM | POA: Insufficient documentation

## 2012-05-17 DIAGNOSIS — I059 Rheumatic mitral valve disease, unspecified: Secondary | ICD-10-CM

## 2012-05-17 HISTORY — PX: TRANSTHORACIC ECHOCARDIOGRAM: SHX275

## 2012-05-17 NOTE — Progress Notes (Signed)
2D Echo Performed 05/17/2012    Maricella Filyaw, RCS  

## 2012-06-17 ENCOUNTER — Encounter (HOSPITAL_COMMUNITY): Payer: Self-pay | Admitting: Pharmacy Technician

## 2012-06-20 ENCOUNTER — Ambulatory Visit
Admission: RE | Admit: 2012-06-20 | Discharge: 2012-06-20 | Disposition: A | Discharge status: Home / Self Care | Payer: Medicare Other | Source: Ambulatory Visit | Attending: Cardiovascular Disease | Admitting: Cardiovascular Disease

## 2012-06-20 ENCOUNTER — Other Ambulatory Visit: Payer: Self-pay | Admitting: Cardiovascular Disease

## 2012-06-20 DIAGNOSIS — Z01811 Encounter for preprocedural respiratory examination: Secondary | ICD-10-CM

## 2012-06-24 ENCOUNTER — Encounter (HOSPITAL_COMMUNITY): Payer: Self-pay | Admitting: General Practice

## 2012-06-24 ENCOUNTER — Encounter (HOSPITAL_COMMUNITY)
Admission: RE | Disposition: A | Discharge status: Home / Self Care | Payer: Self-pay | Source: Ambulatory Visit | Attending: Cardiovascular Disease

## 2012-06-24 ENCOUNTER — Ambulatory Visit (HOSPITAL_COMMUNITY)
Admission: RE | Admit: 2012-06-24 | Discharge: 2012-06-25 | Disposition: A | Discharge status: Home / Self Care | Payer: Medicare Other | Source: Ambulatory Visit | Attending: Cardiovascular Disease | Admitting: Cardiovascular Disease

## 2012-06-24 DIAGNOSIS — Z950 Presence of cardiac pacemaker: Secondary | ICD-10-CM

## 2012-06-24 DIAGNOSIS — Z7901 Long term (current) use of anticoagulants: Secondary | ICD-10-CM

## 2012-06-24 DIAGNOSIS — I2510 Atherosclerotic heart disease of native coronary artery without angina pectoris: Secondary | ICD-10-CM | POA: Diagnosis present

## 2012-06-24 DIAGNOSIS — I340 Nonrheumatic mitral (valve) insufficiency: Secondary | ICD-10-CM | POA: Diagnosis present

## 2012-06-24 DIAGNOSIS — I4891 Unspecified atrial fibrillation: Secondary | ICD-10-CM | POA: Insufficient documentation

## 2012-06-24 DIAGNOSIS — I421 Obstructive hypertrophic cardiomyopathy: Secondary | ICD-10-CM | POA: Diagnosis present

## 2012-06-24 DIAGNOSIS — R001 Bradycardia, unspecified: Secondary | ICD-10-CM

## 2012-06-24 DIAGNOSIS — J45990 Exercise induced bronchospasm: Secondary | ICD-10-CM

## 2012-06-24 DIAGNOSIS — I495 Sick sinus syndrome: Secondary | ICD-10-CM | POA: Insufficient documentation

## 2012-06-24 DIAGNOSIS — I480 Paroxysmal atrial fibrillation: Secondary | ICD-10-CM

## 2012-06-24 HISTORY — DX: Claustrophobia: F40.240

## 2012-06-24 HISTORY — DX: Hypothyroidism, unspecified: E03.9

## 2012-06-24 HISTORY — DX: Paroxysmal atrial fibrillation: I48.0

## 2012-06-24 HISTORY — DX: Other complications of anesthesia, initial encounter: T88.59XA

## 2012-06-24 HISTORY — DX: Dependence on supplemental oxygen: Z99.81

## 2012-06-24 HISTORY — DX: Presence of cardiac pacemaker: Z95.0

## 2012-06-24 HISTORY — DX: Bradycardia, unspecified: R00.1

## 2012-06-24 HISTORY — PX: INSERT / REPLACE / REMOVE PACEMAKER: SUR710

## 2012-06-24 HISTORY — DX: Unspecified chronic bronchitis: J42

## 2012-06-24 HISTORY — DX: Adverse effect of unspecified anesthetic, initial encounter: T41.45XA

## 2012-06-24 HISTORY — DX: Malignant melanoma of other part of trunk: C43.59

## 2012-06-24 HISTORY — PX: PERMANENT PACEMAKER INSERTION: SHX5480

## 2012-06-24 LAB — PROTIME-INR: Prothrombin Time: 19.1 seconds — ABNORMAL HIGH (ref 11.6–15.2)

## 2012-06-24 LAB — SURGICAL PCR SCREEN: Staphylococcus aureus: NEGATIVE

## 2012-06-24 SURGERY — PERMANENT PACEMAKER INSERTION
Anesthesia: LOCAL

## 2012-06-24 MED ORDER — QUINAPRIL HCL 10 MG PO TABS: 20.0000 mg | ORAL_TABLET | Freq: Every day | ORAL | Status: DC

## 2012-06-24 MED ORDER — MIDAZOLAM HCL 2 MG/2ML IJ SOLN
INTRAMUSCULAR | Status: AC
  Filled 2012-06-24: qty 2

## 2012-06-24 MED ORDER — ACETAMINOPHEN 325 MG PO TABS: 325.0000 mg | ORAL_TABLET | ORAL | Status: DC | PRN

## 2012-06-24 MED ORDER — LISINOPRIL 20 MG PO TABS
20.0000 mg | ORAL_TABLET | Freq: Every day | ORAL | Status: DC
  Administered 2012-06-24 – 2012-06-25 (×2): 20 mg via ORAL
  Filled 2012-06-24 (×2): qty 1

## 2012-06-24 MED ORDER — DILTIAZEM HCL ER COATED BEADS 180 MG PO CP24
180.0000 mg | ORAL_CAPSULE | Freq: Every evening | ORAL | Status: DC
  Administered 2012-06-24: 180 mg via ORAL
  Filled 2012-06-24 (×3): qty 1

## 2012-06-24 MED ORDER — SODIUM CHLORIDE 0.9 % IR SOLN
80.0000 mg | Status: DC
  Filled 2012-06-24: qty 2

## 2012-06-24 MED ORDER — POTASSIUM CHLORIDE CRYS ER 10 MEQ PO TBCR
10.0000 meq | EXTENDED_RELEASE_TABLET | Freq: Every day | ORAL | Status: DC
  Administered 2012-06-24: 10 meq via ORAL
  Filled 2012-06-24 (×2): qty 1

## 2012-06-24 MED ORDER — WARFARIN SODIUM 2.5 MG PO TABS
3.7500 mg | ORAL_TABLET | ORAL | Status: AC
  Administered 2012-06-24: 3.75 mg via ORAL
  Filled 2012-06-24: qty 1

## 2012-06-24 MED ORDER — NITROGLYCERIN 0.4 MG SL SUBL: 0.4000 mg | SUBLINGUAL_TABLET | SUBLINGUAL | Status: DC | PRN

## 2012-06-24 MED ORDER — ALBUTEROL SULFATE HFA 108 (90 BASE) MCG/ACT IN AERS
2.0000 | INHALATION_SPRAY | Freq: Four times a day (QID) | RESPIRATORY_TRACT | Status: DC | PRN
Start: 2012-06-24 — End: 2012-06-25
  Filled 2012-06-24: qty 6.7

## 2012-06-24 MED ORDER — FENTANYL CITRATE 0.05 MG/ML IJ SOLN
INTRAMUSCULAR | Status: AC
  Filled 2012-06-24: qty 2

## 2012-06-24 MED ORDER — MUPIROCIN 2 % EX OINT
TOPICAL_OINTMENT | CUTANEOUS | Status: AC
  Filled 2012-06-24: qty 22

## 2012-06-24 MED ORDER — WARFARIN SODIUM 2.5 MG PO TABS
2.5000 mg | ORAL_TABLET | ORAL | Status: DC
  Filled 2012-06-24: qty 1

## 2012-06-24 MED ORDER — SODIUM CHLORIDE 0.9 % IJ SOLN: 3.0000 mL | INTRAMUSCULAR | Status: DC | PRN

## 2012-06-24 MED ORDER — LIDOCAINE HCL (PF) 1 % IJ SOLN
INTRAMUSCULAR | Status: AC
  Filled 2012-06-24: qty 60

## 2012-06-24 MED ORDER — HYDROCODONE-ACETAMINOPHEN 5-325 MG PO TABS: 1.0000 | ORAL_TABLET | ORAL | Status: DC | PRN

## 2012-06-24 MED ORDER — ONDANSETRON HCL 4 MG/2ML IJ SOLN: 4.0000 mg | Freq: Four times a day (QID) | INTRAMUSCULAR | Status: DC | PRN

## 2012-06-24 MED ORDER — CEFAZOLIN SODIUM-DEXTROSE 2-3 GM-% IV SOLR
2.0000 g | INTRAVENOUS | Status: DC
  Filled 2012-06-24: qty 50

## 2012-06-24 MED ORDER — AMIODARONE HCL 200 MG PO TABS
200.0000 mg | ORAL_TABLET | Freq: Every day | ORAL | Status: DC
  Administered 2012-06-24 – 2012-06-25 (×2): 200 mg via ORAL
  Filled 2012-06-24 (×3): qty 1

## 2012-06-24 MED ORDER — YOU HAVE A PACEMAKER BOOK
Freq: Once | Status: AC
  Administered 2012-06-24: 19:00:00
  Filled 2012-06-24: qty 1

## 2012-06-24 MED ORDER — LEVOTHYROXINE SODIUM 100 MCG PO TABS
100.0000 ug | ORAL_TABLET | Freq: Every day | ORAL | Status: DC
  Administered 2012-06-25: 09:00:00 100 ug via ORAL
  Filled 2012-06-24 (×2): qty 1

## 2012-06-24 MED ORDER — WARFARIN SODIUM 2.5 MG PO TABS: 2.5000 mg | ORAL_TABLET | Freq: Every evening | ORAL | Status: DC

## 2012-06-24 MED ORDER — METOPROLOL TARTRATE 50 MG PO TABS
150.0000 mg | ORAL_TABLET | Freq: Two times a day (BID) | ORAL | Status: DC
  Administered 2012-06-24: 23:00:00 150 mg via ORAL
  Filled 2012-06-24 (×4): qty 1

## 2012-06-24 MED ORDER — ASPIRIN 81 MG PO TABS: 81.0000 mg | ORAL_TABLET | Freq: Every day | ORAL | Status: DC

## 2012-06-24 MED ORDER — CHLORHEXIDINE GLUCONATE 4 % EX LIQD
60.0000 mL | Freq: Once | CUTANEOUS | Status: DC
  Filled 2012-06-24: qty 60

## 2012-06-24 MED ORDER — CEFAZOLIN SODIUM-DEXTROSE 2-3 GM-% IV SOLR
INTRAVENOUS | Status: AC
  Filled 2012-06-24: qty 50

## 2012-06-24 MED ORDER — ASPIRIN 81 MG PO CHEW
81.0000 mg | CHEWABLE_TABLET | Freq: Every day | ORAL | Status: DC
  Administered 2012-06-24: 81 mg via ORAL
  Filled 2012-06-24 (×2): qty 1

## 2012-06-24 MED ORDER — WARFARIN - PHYSICIAN DOSING INPATIENT: Freq: Every day | Status: DC

## 2012-06-24 MED ORDER — CEFAZOLIN SODIUM 1-5 GM-% IV SOLN
1.0000 g | Freq: Four times a day (QID) | INTRAVENOUS | Status: AC
  Administered 2012-06-24 – 2012-06-25 (×3): 1 g via INTRAVENOUS
  Filled 2012-06-24 (×3): qty 50

## 2012-06-24 MED ORDER — MUPIROCIN 2 % EX OINT
TOPICAL_OINTMENT | Freq: Two times a day (BID) | CUTANEOUS | Status: DC
  Administered 2012-06-24: 10:00:00 via NASAL
  Filled 2012-06-24: qty 22

## 2012-06-24 MED ORDER — ALBUTEROL SULFATE (5 MG/ML) 0.5% IN NEBU: 2.5000 mg | INHALATION_SOLUTION | Freq: Four times a day (QID) | RESPIRATORY_TRACT | Status: DC | PRN

## 2012-06-24 MED ORDER — SODIUM CHLORIDE 0.45 % IV SOLN
INTRAVENOUS | Status: DC
  Administered 2012-06-24: 10:00:00 via INTRAVENOUS

## 2012-06-24 MED ORDER — GUAIFENESIN ER 600 MG PO TB12
1200.0000 mg | ORAL_TABLET | Freq: Two times a day (BID) | ORAL | Status: DC
  Administered 2012-06-24: 23:00:00 1200 mg via ORAL
  Administered 2012-06-25: 09:00:00 600 mg via ORAL
  Filled 2012-06-24 (×3): qty 2

## 2012-06-24 NOTE — CV Procedure (Signed)
Black,Carla A Female, 79 y.o., December 07, 1927  Location: C 9 MRN: [id number]  CSN: [id number] Admit Dt: 06/24/12      Procedure report  Procedure performed:  1. Implantation of new dual chamber permanent pacemaker 2. Fluoroscopy 3. Light sedation    Reason for procedure: Symptomatic bradycardia due to: Sinus node dysfunction Tachycardia-bradycardia syndrome Bradycardia due to necessary medications Paroxysmal atrial fibrillation  Procedure performed by: Thurmon Fair, MD  Complications: None  Estimated blood loss: <10 mL  Medications administered during procedure: Ancef 2 g intravenously Lidocaine 1% 30 mL locally,  Fentanyl 100 mcg intravenously Versed 5 mg intravenously  Device details:  Generator Medtronic Adapta ADDRL1 serial number [id number] H Right atrial lead Medtronic [id number] serial number [id number] Right ventricular lead Medtronic 5076-52 cm serial number [id number]  Procedure details:  After the risks and benefits of the procedure were discussed the patient provided informed consent and was brought to the cardiac cath lab in the fasting state. The patient was prepped and draped in usual sterile fashion. Local anesthesia with 1% lidocaine was administered to to the left infraclavicular area. A 5-6 cm horizontal incision was made parallel with and 2-3 cm caudal to the left clavicle. Using electrocautery and blunt dissection a prepectoral pocket was created down to the level of the pectoralis major muscle fascia. The pocket was carefully inspected for hemostasis. An antibiotic-soaked sponge was placed in the pocket.  Under fluoroscopic guidance and using the modified Seldinger technique 2 separate venipunctures were performed to access the left subclavian vein. No difficulty was encountered accessing the vein.  Two J-tip guidewires were subsequently exchanged for two 7 French safe sheaths.  Under fluoroscopic guidance the ventricular lead was advanced with  considerable difficulty (due to TR, dilated RA) to level of the mid to apical right ventricular septum and thet active-fixation helix was deployed. Prominent current of injury was seen. Satisfactory pacing and sensing parameters were recorded. There was no evidence of diaphragmatic stimulation at maximum device output. The safe sheath was peeled away and the lead was secured in place with 2-0 silk.  In similar fashion the right atrial lead was advanced to the level of the atrial appendage. The active-fixation helix was deployed. There was prominent current of injury. Satisfactory  pacing and sensing parameters were recorded. There was no evidence of diaphragmatic stimulation with pacing at maximum device output. The safe sheath was peeled away and the lead was secured in place with 2-0 silk.  The antibiotic-soaked sponge was removed from the pocket. The pocket was flushed with copious amounts of antibiotic solution. Reinspection showed excellent hemostasis..  The ventricular lead was connected to the generator and appropriate ventricular pacing was seen. Subsequently the atrial lead was also connected. Repeat testing of the lead parameters later showed excellent values.  The entire system was then carefully inserted in the pocket with care been taking that the leads and device assumed a comfortable position without pressure on the incision. Great care was taken that the leads be located deep to the generator. The pocket was then closed in layers using 2 layers of 2-0 Vicryl and cutaneous staples, after which a sterile dressing was applied.  At the end of the procedure the following lead parameters were encountered:  Right atrial lead  sensed P waves 2.7, impedance 806ohms, threshold 1.5 V at 0.5 ms pulse width.  Right ventricular lead sensed R waves 10.4 mV, impedance 1005 ohms, threshold 1.0 V at 0.5 ms pulse width.  Thurmon Fair, MD, [facility] (  [phone]  office [phone] pager 06/24/2012 3:39 PM

## 2012-06-24 NOTE — Progress Notes (Signed)
Utilization Review Completed.   Melena Hayes, RN, BSN Nurse Case Manager  [phone]  

## 2012-06-24 NOTE — H&P (Signed)
Date of Initial H&P: 06/21/2012  History reviewed, patient examined, no change in status, stable for surgery. Tachycardia bradycardia sd in a patient with HOCM and severe LVOT obstruction and severe MR of mixed etiology (SAM and ruptured chord). Here for dual chamber PPM. This procedure has been fully reviewed with the patient and written informed consent has been obtained. Thurmon Fair, MD, [facility] [phone] office [phone] pager

## 2012-06-24 NOTE — Progress Notes (Signed)
Dozing quietly, awakens easily.  BP 168/48.  Denies complaints.  AV paced 60.

## 2012-06-24 NOTE — Progress Notes (Signed)
Per day RN Amy:  Pt insisted on walking to bathroom after adamantly refusing bedpan or BSC.  Pt amb w/ slow steady gait.  SOB w/ exertion.  Had to lie flat to boost in bed and lips became cyanotic and O2 sat dropped in 80's and c/o feeling slightly dizzy.  Placed back on O2 3L per [place] and recovered quickly but BP 196/76.  Tele shows AV paced 60. Pt states had not had any meds today.  Will catch pt up on po meds.  Advised pt it would be best for her safety to use bsc or bedpan next time to prevent risk of fall.  Pt voices understanding.

## 2012-06-25 ENCOUNTER — Ambulatory Visit (HOSPITAL_COMMUNITY): Payer: Medicare Other

## 2012-06-25 MED ORDER — HYDROCODONE-ACETAMINOPHEN 5-325 MG PO TABS: 1.0000 | ORAL_TABLET | ORAL | Status: DC | PRN

## 2012-06-25 MED ORDER — ACETAMINOPHEN 325 MG PO TABS: 325.0000 mg | ORAL_TABLET | ORAL | Status: AC | PRN

## 2012-06-25 NOTE — Progress Notes (Signed)
Subjective: No complaints--AV pacing  Objective: Vital signs in last 24 hours: Temp:  [97.6 F (36.4 C)-98 F (36.7 C)] 97.7 F (36.5 C) (01/04 0524) Pulse Rate:  [53-60] 60  (01/04 0524) Resp:  [16-27] 16  (01/04 0524) BP: (145-201)/(45-89) 153/53 mmHg (01/04 0524) SpO2:  [86 %-99 %] 96 % (01/04 0524) Weight:  [86.183 kg (190 lb)-87 kg (191 lb 12.8 oz)] 87 kg (191 lb 12.8 oz) (01/04 0031) Weight change:  Last BM Date: 06/24/12 Intake/Output from previous day: -145 01/03 0701 - 01/04 0700 In: 490 [P.O.:440; IV Piggyback:50] Out: 625 [Urine:625] Intake/Output this shift:    PE: General:alert and oriented, weak but ambulating to BR Heart:S1S2 RRR Lungs:clear without rales or wheezes Abd:+ BS soft, non tender Ext:1+ edema       Studies/Results: PROCEDURES:  06/24/12 Generator Medtronic Adapta ADDRL1 serial number [id number]  Right atrial lead Medtronic [id number] serial number [id number]  Right ventricular lead Medtronic 5076-52 cm serial number [id number]     Medications: I have reviewed the patient's current medications.    Marland Kitchen amiodarone  200 mg Oral Daily  . aspirin  81 mg Oral Daily  .  ceFAZolin (ANCEF) IV  1 g Intravenous Q6H  . diltiazem  180 mg Oral QPM  . guaiFENesin  1,200 mg Oral BID  . levothyroxine  100 mcg Oral Daily  . lisinopril  20 mg Oral Daily  . metoprolol  150 mg Oral BID  . potassium chloride SA  10 mEq Oral Daily  . warfarin  2.5 mg Oral Custom  . Warfarin - Physician Dosing Inpatient   Does not apply q1800   Assessment/Plan: Principal Problem:  *Symptomatic bradycardia Active Problems:  PAF (paroxysmal atrial fibrillation)  S/P cardiac pacemaker procedure, insertion Medtronic device 06/24/12  Coronary artery disease, last cath 9/12-medical Rx  Mitral insufficiency due to HOCM and ruptured chorda tendina, 2D 11/12  Hypertrophic cardiomyopathy  Chronic anticoagulation   PLAN: ambulate and discharge home. Resumed coumadin, will recheck level  on Monday.  CXR  Completed results pending  LOS: 1 day   INGOLD,LAURA R 06/25/2012, 8:11 AM   I have seen and examined the patient along with [facility] R, NP.  I have reviewed the chart, notes and new data.  I agree with NP's note.  Key examination changes: site healthy Key new findings / data: CXR lead configuration is good. No pneumothorax. A lead P 2.8-4.0 mV, impedance 559 ohm, threshold 0.5V@0 .4ms R wave 15 mv, impedance 928 ohm, threshold 0.5V@0 .4ms  PLAN: DC home, recheck INR Monday.  Thurmon Fair, MD, [facility] [phone] 06/25/2012, 8:40 AM

## 2012-06-25 NOTE — Discharge Summary (Signed)
Physician Discharge Summary  Patient ID: Carla Black MRN: [id number] DOB/AGE: [birth year] 77 y.o.  Admit date: 06/24/2012 Discharge date: 06/25/2012  Discharge Diagnoses:  Principal Problem:  *Symptomatic bradycardia Active Problems:  PAF (paroxysmal atrial fibrillation)  S/P cardiac pacemaker procedure, insertion Medtronic device 06/24/12  Coronary artery disease, last cath 9/12-medical Rx  Mitral insufficiency due to HOCM and ruptured chorda tendina, 2D 11/12  Hypertrophic cardiomyopathy  Chronic anticoagulation   Discharged Condition: good  PROCEDURES: 06/24/2012  By Dr. Royann Shivers Generator Medtronic Adapta ADDRL1 serial number [id number]  Right atrial lead Medtronic [phone] serial number [id number]  Right ventricular lead Medtronic 5076-52 cm serial number [id number]     Hospital Course: 79 year old white female presents to Short Stay for dual chamber PPM due to Tachycardia/ bradycardia syndrome in a patient with HOCM and severe LVOT obstruction and severe MR of mixed etiology (SAM and ruptured chord).  Pt underwent pacemaker insertion without complications.  The next am was ambulating in room and stable.  She was AV Pacing.  CXR without pneumothorax. She was stable, seen by Dr. Royann Shivers and discharged home.  Pacer interrogation: A lead P 2.8-4.0 mV, impedance 559 ohm, threshold 0.5V@0 .4ms  R wave 15 mv, impedance 928 ohm, threshold 0.5V@0 .4ms     She will have Protime done in our office on Tuesday .  The office will call with date and time.  Consults: None  Significant Diagnostic Studies:  CHEST - 2 VIEW on 06/25/12  (please note PPM not ICD) Comparison: 06/20/2012  Findings: New AICD is seen in expected position with leads in the  right atrium and right ventricle. No pneumothorax identified.  Cardiomegaly stable. Mild diffuse interstitial infiltrates are  again seen, suspicious for mild interstitial edema. A tiny right  pleural effusion is less likely increased in  size. Mild left  basilar atelectasis or scarring again demonstrated.  IMPRESSION:  1. AICD in appropriate position. No evidence of pneumothorax.  2. Mild congestive heart failure, with slight increase in size of  tiny right pleural effusion.    Discharge Exam: Blood pressure 164/56, pulse 60, temperature 98.4 F (36.9 C), temperature source Oral, resp. rate 19, height 5' 3.5" (1.613 m), weight 87 kg (191 lb 12.8 oz), SpO2 96.00%.  Day of discharge PE: General:alert and oriented, weak but ambulating to BR  Heart:S1S2 RRR  Lungs:clear without rales or wheezes  Abd:+ BS soft, non tender  Ext:1+ edema  Disposition: 01-Home or Self Care  Discharge Orders    Future Appointments: Provider: Department: Dept Phone: Center:   09/21/2012 3:00 PM [facility] [phone] [facility]   09/21/2012 4:00 PM Chuck Hint, MD [facility] [phone] [facility]       Medication List     As of 06/25/2012  2:40 PM    TAKE these medications         acetaminophen 325 MG tablet   Commonly known as: TYLENOL   Take 1-2 tablets (325-650 mg total) by mouth every 4 (four) hours as needed.      albuterol 108 (90 BASE) MCG/ACT inhaler   Commonly known as: PROVENTIL HFA;VENTOLIN HFA   Inhale 2 puffs into the lungs every 6 (six) hours as needed for wheezing.      ALIGN PO   Take 1 capsule by mouth every morning.      amiodarone 200 MG tablet   Commonly known as: PACERONE   Take 200 mg by mouth every morning.  aspirin 81 MG tablet   Take 81 mg by mouth daily.      diltiazem 180 MG 24 hr capsule   Commonly known as: CARDIZEM CD   Take 180 mg by mouth every evening.      furosemide 40 MG tablet   Commonly known as: LASIX   Take 20 mg by mouth daily as needed. For fluid retention      guaiFENesin 600 MG 12 hr tablet   Commonly known as: MUCINEX   Take 1,200 mg by mouth 2 (two) times daily.      HYDROcodone-acetaminophen 5-325  MG per tablet   Commonly known as: NORCO/VICODIN   Take 1-2 tablets by mouth every 4 (four) hours as needed.      ICAPS AREDS FORMULA PO   Take 1 capsule by mouth 2 (two) times daily.      levalbuterol 0.31 MG/3ML nebulizer solution   Commonly known as: XOPENEX   Take 1 ampule by nebulization 3 (three) times daily.      levothyroxine 100 MCG tablet   Commonly known as: SYNTHROID, LEVOTHROID   Take 1 tablet (100 mcg total) by mouth daily.      metoprolol 100 MG tablet   Commonly known as: LOPRESSOR   Take 150 mg by mouth 2 (two) times daily.      nitroGLYCERIN 0.4 MG SL tablet   Commonly known as: NITROSTAT   Place 0.4 mg under the tongue every 5 (five) minutes as needed. For chest pain      OMEGA 3-6-9 COMPLEX PO   Take 1 capsule by mouth 2 (two) times daily.      potassium chloride SA 20 MEQ tablet   Commonly known as: K-DUR,KLOR-CON   Take 10 mEq by mouth daily. When taking furosemide      quinapril 20 MG tablet   Commonly known as: ACCUPRIL   Take 20 mg by mouth daily.      warfarin 2.5 MG tablet   Commonly known as: COUMADIN   Take 2.5-3.75 mg by mouth every evening. 3.75 mg (1.5 tablets) on Friday and 2.5 mg (1 tablet) all other days           Follow-up Information    Follow up with [facility], NP. (our office will call with date and time)    Contact information:   [address] [place] [address] [phone]        DISCHARGE Instructions:  See pacemaker discharge instructions. Will have pt's INR evaluated on Tuesday. SignedLeone Brand 06/25/2012, 2:40 PM

## 2012-09-20 ENCOUNTER — Encounter: Payer: Self-pay | Admitting: Vascular Surgery

## 2012-09-21 ENCOUNTER — Ambulatory Visit: Payer: Medicare Other | Admitting: Vascular Surgery

## 2012-09-21 ENCOUNTER — Other Ambulatory Visit: Payer: Medicare Other

## 2012-09-30 IMAGING — CR DG CHEST 2V
2 series · 2 of 2 positions shown · non-contrast
Comparison: 11/25/2011

CLINICAL DATA: Cough, pneumonia

CHEST - 2 VIEW

[w chest pa]
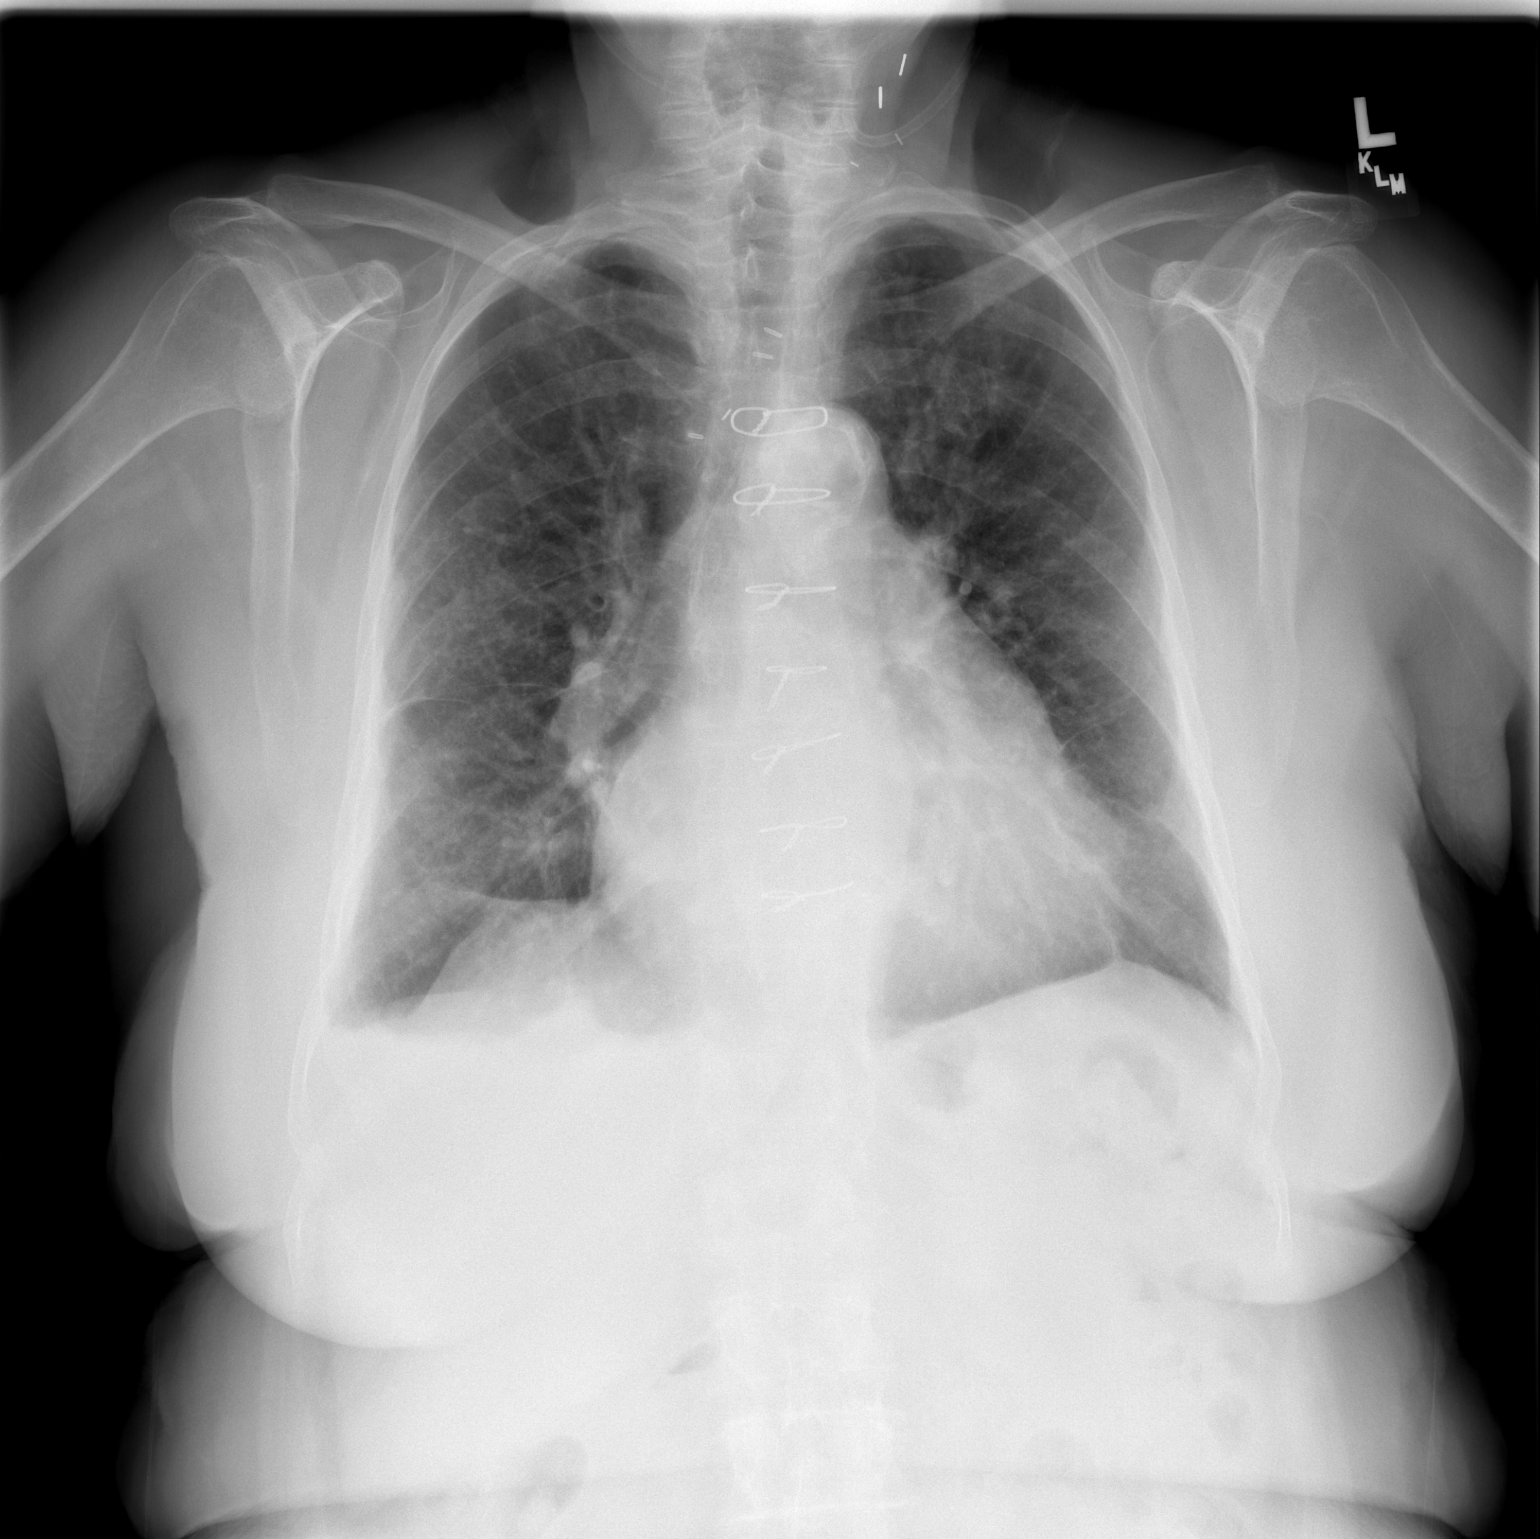

[w chest lat]
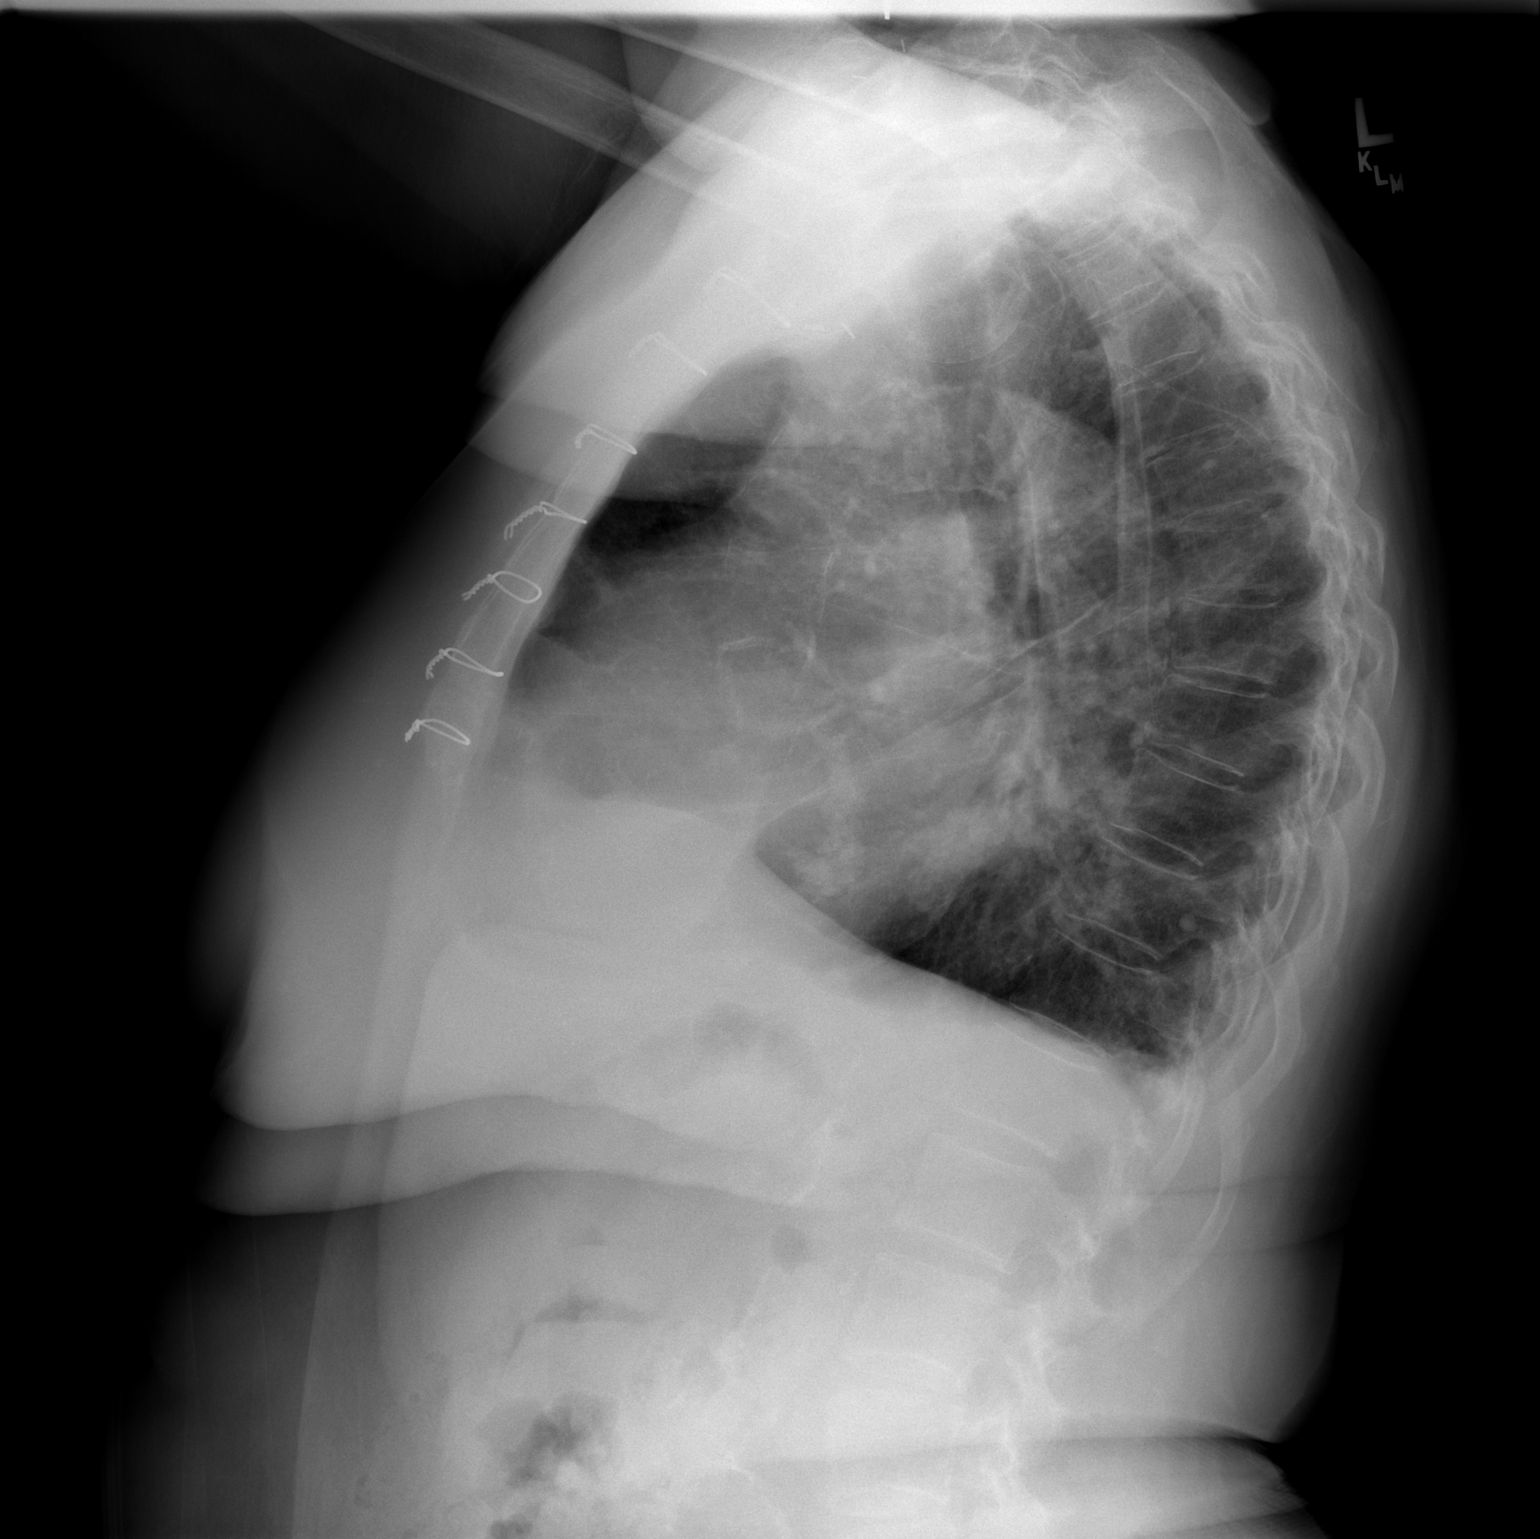

[2 of 2 positions shown; findings below may reference images not displayed]

FINDINGS: Prior median sternotomy noted.  Heart remains enlarged
with residual vascular congestion and interstitial prominence.
Minimal basilar atelectasis but no current pneumonia or CHF.  No
pneumothorax.  Trachea midline.  Atherosclerosis of the aorta.
IMPRESSION: Resolved right lower lobe pneumonia.

Bibasilar atelectasis.

Cardiomegaly with vascular congestion but no CHF

## 2012-10-03 ENCOUNTER — Other Ambulatory Visit: Payer: Self-pay | Admitting: Physician Assistant

## 2012-10-27 ENCOUNTER — Telehealth: Payer: Self-pay

## 2012-10-27 MED ORDER — LEVOTHYROXINE SODIUM 100 MCG PO TABS: ORAL_TABLET | ORAL | Status: DC

## 2012-10-27 NOTE — Telephone Encounter (Signed)
Sent in; thanks

## 2012-10-27 NOTE — Telephone Encounter (Signed)
Pt recently was given a 30 day refill of her synthroid (usually gets 90 days). She has scheduled her CPE with Dr. Merla Riches for 7/2 (first available!). Can we carry her thru til this appt?  [facility] on [address]  [phone]

## 2012-11-20 ENCOUNTER — Encounter: Payer: Self-pay | Admitting: Pharmacist Clinician (PhC)/ Clinical Pharmacy Specialist

## 2012-11-22 ENCOUNTER — Ambulatory Visit (INDEPENDENT_AMBULATORY_CARE_PROVIDER_SITE_OTHER): Payer: Medicare Other | Admitting: Pharmacist Clinician (PhC)/ Clinical Pharmacy Specialist

## 2012-11-22 ENCOUNTER — Ambulatory Visit (INDEPENDENT_AMBULATORY_CARE_PROVIDER_SITE_OTHER): Payer: Medicare Other | Admitting: Cardiovascular Disease

## 2012-11-22 ENCOUNTER — Other Ambulatory Visit: Payer: Self-pay | Admitting: Cardiovascular Disease

## 2012-11-22 VITALS — BP 130/72 | HR 61 | Resp 20 | Ht 63.5 in | Wt 201.0 lb

## 2012-11-22 DIAGNOSIS — I421 Obstructive hypertrophic cardiomyopathy: Secondary | ICD-10-CM

## 2012-11-22 DIAGNOSIS — I5032 Chronic diastolic (congestive) heart failure: Secondary | ICD-10-CM

## 2012-11-22 DIAGNOSIS — I340 Nonrheumatic mitral (valve) insufficiency: Secondary | ICD-10-CM

## 2012-11-22 DIAGNOSIS — Z7901 Long term (current) use of anticoagulants: Secondary | ICD-10-CM

## 2012-11-22 DIAGNOSIS — R001 Bradycardia, unspecified: Secondary | ICD-10-CM

## 2012-11-22 DIAGNOSIS — Z87898 Personal history of other specified conditions: Secondary | ICD-10-CM

## 2012-11-22 DIAGNOSIS — I480 Paroxysmal atrial fibrillation: Secondary | ICD-10-CM

## 2012-11-22 DIAGNOSIS — Z950 Presence of cardiac pacemaker: Secondary | ICD-10-CM

## 2012-11-22 DIAGNOSIS — I10 Essential (primary) hypertension: Secondary | ICD-10-CM

## 2012-11-22 DIAGNOSIS — I498 Other specified cardiac arrhythmias: Secondary | ICD-10-CM

## 2012-11-22 DIAGNOSIS — I4891 Unspecified atrial fibrillation: Secondary | ICD-10-CM

## 2012-11-22 DIAGNOSIS — E785 Hyperlipidemia, unspecified: Secondary | ICD-10-CM

## 2012-11-22 DIAGNOSIS — I059 Rheumatic mitral valve disease, unspecified: Secondary | ICD-10-CM

## 2012-11-22 DIAGNOSIS — I2510 Atherosclerotic heart disease of native coronary artery without angina pectoris: Secondary | ICD-10-CM

## 2012-11-22 LAB — PACEMAKER DEVICE OBSERVATION

## 2012-11-22 LAB — POCT INR: INR: 2.4

## 2012-11-22 NOTE — Patient Instructions (Addendum)
Please  follow up with Dr. Royann Shivers in 3 months. Take Furosemide 40mg  daily when weight is 195 lbs or greater. Do not take Furosemide on days where weight is less than this 195 lbs. Please do as much activity as you can tolerate. Follow up with Dr.Croitoru in 3 months to reevaluate pacemaker.

## 2012-11-22 NOTE — Progress Notes (Signed)
In office pacemaker interrogation. Normal device function. No changes in therapy.

## 2012-11-24 LAB — PACEMAKER DEVICE OBSERVATION
AL IMPEDENCE PM: 526 Ohm
AL THRESHOLD: 0.5 V
ATRIAL PACING PM: 100
RV LEAD AMPLITUDE: 4 mv
RV LEAD THRESHOLD: 0.5 V

## 2012-11-27 ENCOUNTER — Encounter: Payer: Self-pay | Admitting: Cardiovascular Disease

## 2012-11-27 NOTE — Assessment & Plan Note (Signed)
Her lipid profile is quite unfavorable but multiple statins tablet to be intolerable myalgia including Crestor Lipitor Zocor or Vytorin.

## 2012-11-27 NOTE — Progress Notes (Signed)
Patient ID: [name], female   DOB: Jul 25, 1927, 79 y.o.   MRN: [id number]     Reason for office visit Followup hypertrophic obstructive cardiomyopathy, congestive heart failure, atrial fibrillation, pacemaker check  Mrs. Voris is now 79 years old and has a very complicated cardiac situation. Her treatment is challenging due to complete needs in the setting of hypertrophic obstructive cardiomyopathy, paroxysmal atrial fibrillation, coronary disease and hypertension.  Treatment with amiodarone was necessary since atrial fibrillation with was difficult to rate control and was associated with intractable congestive heart failure and hypoxemia. The amiodarone lead to marked bradycardia and eventually implantation of a dual-chamber permanent pacemaker. Following pacemaker implantation and use a very high doses of beta blockers and diltiazem her left ventricular obstruction has diminished considerably. 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  She does well as long as she moves slowly and "paces herself". When she tries to hurry she quickly becomes short of breath and also notices that her heart rate increases very rapidly. Chest had intermittent problems with lower showed edema she has not had any chest pain focal neurological deficits or bleeding problems    Allergies  Allergen Reactions  . Metoprolol Other (See Comments)    Bradycardia  . Pseudoephedrine Other (See Comments)    Insomnia (took 1 tablet and could not sleep for 3 days)  . Statins Other (See Comments)    myalgia  . Codeine Nausea Only and Other (See Comments)    dizziness  . Seldane (Terfenadine) Nausea Only and Other (See Comments)    dizziness    Current Outpatient Prescriptions  Medication Sig Dispense Refill  . acetaminophen (TYLENOL) 325 MG tablet  Take 1-2 tablets (325-650 mg total) by mouth every 4 (four) hours as needed.      Marland Kitchen amiodarone (PACERONE) 200 MG tablet Take 200 mg by mouth every morning.       Marland Kitchen aspirin 81 MG tablet Take 81 mg by mouth daily.       Marland Kitchen diltiazem (CARDIZEM CD) 180 MG 24 hr capsule Take 180 mg by mouth every evening.       . furosemide (LASIX) 40 MG tablet Take 20 mg by mouth daily as needed. For fluid retention       . guaiFENesin (MUCINEX) 600 MG 12 hr tablet Take 1,200 mg by mouth 2 (two) times daily.      Marland Kitchen levalbuterol (XOPENEX) 0.31 MG/3ML nebulizer solution Take 1 ampule by nebulization 3 (three) times daily.      Marland Kitchen levothyroxine (SYNTHROID, LEVOTHROID) 100 MCG tablet TAKE ONE TABLET BY MOUTH DAILY. NEEDS OFFICE VISIT.  90 tablet  0  . meclizine (ANTIVERT) 25 MG tablet Take 25 mg by mouth as needed.      . metoprolol (LOPRESSOR) 100 MG tablet Take 150 mg by mouth 2 (two) times daily.       . mometasone (NASONEX) 50 MCG/ACT nasal spray Place 2 sprays into the nose 2 (two) times daily.      . Multiple Vitamins-Minerals (ICAPS AREDS FORMULA PO) Take 1 capsule by mouth 2 (two) times daily.       . Omega 3-6-9 Fatty Acids (OMEGA 3-6-9 COMPLEX PO) Take 1 capsule by mouth 2 (two) times daily.       . potassium chloride SA (K-DUR,KLOR-CON) 20 MEQ tablet Take 10 mEq by mouth daily. When  taking furosemide       . Probiotic Product (ALIGN PO) Take 1 capsule by mouth every morning.       . quinapril (ACCUPRIL) 20 MG tablet Take 20 mg by mouth daily.      Marland Kitchen warfarin (COUMADIN) 2.5 MG tablet Take 2.5-3.75 mg by mouth every evening. 3.75 mg (1.5 tablets) on Friday and 2.5 mg (1 tablet) all other days      . albuterol (PROVENTIL HFA;VENTOLIN HFA) 108 (90 BASE) MCG/ACT inhaler Inhale 2 puffs into the lungs every 6 (six) hours as needed for wheezing.  1 Inhaler  3  . nitroGLYCERIN (NITROSTAT) 0.4 MG SL tablet Place 0.4 mg under the tongue every 5 (five) minutes as needed. For chest pain       No current facility-administered  medications for this visit.    Past Medical History  Diagnosis Date  . Hypertension   . Hyperlipidemia   . Heart murmur   . GERD (gastroesophageal reflux disease)   . Joint pain     left knee  . Dizziness   . Chest tightness 02/09/2008  . Hiatal hernia   . CHF (congestive heart failure)   . Angina   . Coronary artery disease   . Hypertrophic cardiomyopathy     subvalvular gradient 45-mm  . Dysrhythmia     hx of atrial fibrilation  . On home oxygen therapy     "2L at rest; 3L w/activity" (06/24/2012)  . Pacemaker 06/24/2012  . Melanoma of back 1961  . Complication of anesthesia   . Claustrophobia     "severe" (06/24/2012)  . Carotid artery occlusion   . Pneumonia     "lastest was 04/26/2011; multiple times before that" (06/24/2012)  . Chronic bronchitis     "used to get it often" (06/24/2012)  . Shortness of breath     "all the time" (06/24/2012)  . Hypothyroidism   . Stroke 2012    hx of TIA  . Arthritis     "2 fingers" (06/24/2012)  . PAF (paroxysmal atrial fibrillation) 06/24/2012  . Symptomatic bradycardia 06/24/2012  . S/P cardiac pacemaker procedure, insertion Medtronic device 06/24/12 06/24/2012  . Carotid artery disease 09/21/2011    40-59% R ICA stenosis; patent L carotid endarterectomy w/minimal restenosis; bilateral vertebral arteries are antegrade    Past Surgical History  Procedure Laterality Date  . 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 Microlaryngoscopy with co2 laser and excision of vocal cord lesion  1983  . Carotid endarterectomy  1990's    "left" (06/24/2012)  . Cardiac catheterization  03/14/2011    Stent CX 40% with nml ffr  . Tee without cardioversion  05/01/2011    Procedure: TRANSESOPHAGEAL ECHOCARDIOGRAM (TEE);  Surgeon: Thurmon Fair;  Location: [facility];  Service: Cardiovascular;  Laterality: N/A;  . Cardioversion  05/01/2011    Procedure: CARDIOVERSION;  Surgeon: Rachelle Hora Estaban [place];  Location: [facility];   Service: Cardiovascular;  Laterality: N/A;  . Cardioversion  06/30/2011    Procedure: CARDIOVERSION;  Surgeon: Thurmon Fair, MD;  Location: [facility];  Service: Cardiovascular;  Laterality: N/A;  . Insert / replace / remove pacemaker  06/24/2012    dual chamber (06/24/2012)  . Tonsillectomy  1940's?  . Appendectomy  1940's  . Abdominal hysterectomy  1970's  . Dilation and curettage of uterus  1960's - 1970's    "alot" (06/24/2012)  . Cataract extraction w/ intraocular lens  implant, bilateral  2000's  .  Melanoma excision  1961    "off my back" (06/24/2012)  . Microlaryngoscopy with co2 laser and excision of vocal cord lesion    . Transthoracic echocardiogram  05/17/2012    see Procedures tab  . Cardiovascular stress test  09/13/2007    R/P MV - EF 56%; mild ischemia in basal and mid inferolateral regions; global LV systolic function normal; EKG negative for ischemia; pt experienced chest pain during stress test, resolved spontaneously    Family History  Problem Relation Age of Onset  . Heart disease Mother   . Other Father     aneurysm  . Coronary artery disease Other   . Stroke Other   . Stroke Mother     History   Social History  . Marital Status: Married    Spouse Name: N/A    Number of Children: N/A  . Years of Education: N/A   Occupational History  . Not on file.   Social History Main Topics  . Smoking status: Former Smoker -- 1.00 packs/day for 35 years    Types: Cigarettes    Quit date: 06/22/1989  . Smokeless tobacco: Never Used  . Alcohol Use: 0.0 oz/week     Comment: 06/24/2012 "suppose to drink 6oz wine qd; haven't had a glass in > 1 month"  . Drug Use: No  . Sexually Active: No   Other Topics Concern  . Not on file   Social History Narrative  . No narrative on file    Review of systems: The patient specifically denies any chest pain at rest or with exertion, dyspnea at res, orthopnea, paroxysmal nocturnal dyspnea, syncope,, focal neurological deficits,  intermittent claudication, unexplained weight gain, cough, hemoptysis or wheezing.  The patient also denies abdominal pain, nausea, vomiting, dysphagia, diarrhea, constipation, polyuria, polydipsia, dysuria, hematuria, frequency, urgency, abnormal bleeding or bruising, fever, chills, unexpected weight changes, mood swings, change in skin or hair texture, change in voice quality, auditory or visual problems, allergic reactions or rashes, new musculoskeletal complaints other than usual "aches and pains".   PHYSICAL EXAM BP 130/72  Pulse 61  Resp 20  Ht 5' 3.5" (1.613 m)  Wt 91.173 kg (201 lb)  BMI 35.04 kg/m2  General: Alert, oriented x3, no distress Head: no evidence of trauma, PERRL, EOMI, no exophtalmos or lid lag, no myxedema, no xanthelasma; normal ears, nose and oropharynx Neck: normal jugular venous pulsations and no hepatojugular reflux; brisk carotid pulses without delay and no carotid bruits Chest: clear to auscultation, no signs of consolidation by percussion or palpation, normal fremitus, symmetrical and full respiratory excursions; healthy sternotomy scar and health left subclavian pacemaker site Cardiovascular: normal position and quality of the apical impulse, regular rhythm, normal first and paradoxically split second heart sounds, grade 1-2/6 systolic murmur at apex with limited radiation, no inducible murmur with a Valsalva maneuver, no rubs or gallops Abdomen: no tenderness or distention, no masses by palpation, no abnormal pulsatility or arterial bruits, normal bowel sounds, no hepatosplenomegaly Extremities: no clubbing, cyanosis or edema; 2+ radial, ulnar and brachial pulses bilaterally; 2+ right femoral, posterior tibial and dorsalis pedis pulses; 2+ left femoral, posterior tibial and dorsalis pedis pulses; no subclavian or femoral bruits Neurological: grossly nonfocal   EKG: Atrioventricular sequential paced  Lipid Panel     Component Value Date/Time   CHOL 209*  11/25/2011 0530   TRIG 91 11/25/2011 0530   HDL 54 11/25/2011 0530   CHOLHDL 3.9 11/25/2011 0530   VLDL 18 11/25/2011 0530   LDLCALC 137*  11/25/2011 0530    BMET    Component Value Date/Time   NA 140 11/27/2011 0630   K 3.9 11/27/2011 0630   CL 101 11/27/2011 0630   CO2 29 11/27/2011 0630   GLUCOSE 109* 11/27/2011 0630   BUN 34* 11/27/2011 0630   CREATININE 1.28* 11/27/2011 0630   CREATININE 1.50* 11/24/2011 1132   CALCIUM 8.6 11/27/2011 0630   GFRNONAA 38* 11/27/2011 0630   GFRAA 44* 11/27/2011 0630     ASSESSMENT AND PLAN Hypertrophic obstructive cardiomyopathy(425.11) Left ventricular outflow tract gradients of as high as 125 mm Hg have been documented Suppression of left ventricular outflow tract gradients has required high doses of beta blockers and central acting calcium channel blockers as well as committed ventricular pacing. She tends to decompensate when she has atrial fibrillation. Surgical myectomy versus alcohol septal ablation have been discussed but she does not want to undergo invasive procedures. Despite left ventricle outflow tract gradient with peak of only 22 mm Hg by last echocardiogram, she remains symptomatic due to severe mitral insufficiency, diastolic dysfunction and obesity.   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 Aggressive treatment with vasodilators is not recommended due to the presence of hypertrophic obstructive cardiomyopathy  PAF (paroxysmal atrial fibrillation) While in atrial fibrillation has proven to be very hard to "rate control" she becomes very symptomatic due to congestive heart failure in the setting of hypertrophic cardiomyopathy. She is therefore an antiarrhythmic therapy with amiodarone. Current therapy is working well with less  than 0.1% atrial fibrillation burden by her most recent pacemaker check today. She is on anticoagulation with warfarin and has generally maintained therapeutic levels without bleeding complications. Liver and thyroid function tests should be monitored at least twice yearly  S/P cardiac pacemaker procedure, insertion Medtronic device 06/24/12 The device was necessary due to bradycardia that was symptomatic, secondary to treatment with amiodarone for atrial fibrillation. She has a dual chamber Medtronic device is programmed with a short AV delay for purposeful ventricular pacing, which we are achieving almost 100% of the time. The device is functioning well but she complains of palpitations with minimal activity which may be related to exaggerated sensor settings. We have increased the sensor onset threshold. No episodes of atrial fibrillation or ventricular tachyarrhythmias have been recorded on her most recent pacemaker check  Coronary artery disease, last cath 9/12-medical Rx Chest previously required stents placed to the diagonal artery and a dominant left circumflex coronary artery (2010). Her most recent cardiac catheterization 2012 showed 40-50% in-stent restenosis in the left circumflex coronary artery. Do any meaningful obstruction was a 99% stenosis in the small nondominant right coronary artery which was an old finding. She has no complaints of angina at this time. Unfortunately she is statin intolerant.  Hyperlipidemia Her lipid profile is quite unfavorable but multiple statins tablet to be intolerable myalgia including Crestor Lipitor Zocor or Vytorin.   H/O thymoma 7 remote sternotomy. This would make her at even higher risk for open heart procedures  Chronic diastolic heart failure It is hard to assess whether she is euvolemic. Use of diuretics has to be very cautious as he could worsen left ventricular outflow tract obstruction. Currently NYHA class III shortness of breath on  exertion.  Orders Placed This Encounter  Procedures  . EKG 12-Lead  No orders of the defined types were placed in this encounter.    Junious Silk, MD, [facility] [phone] office [phone] pager  2

## 2012-11-27 NOTE — Assessment & Plan Note (Signed)
Left ventricular outflow tract gradients of as high as 125 mm Hg have been documented Suppression of left ventricular outflow tract gradients has required high doses of beta blockers and central acting calcium channel blockers as well as committed ventricular pacing. She tends to decompensate when she has atrial fibrillation. Surgical myectomy versus alcohol septal ablation have been discussed but she does not want to undergo invasive procedures. Despite left ventricle outflow tract gradient with peak of only 22 mm Hg by last echocardiogram, she remains symptomatic due to severe mitral insufficiency, diastolic dysfunction and obesity.

## 2012-11-27 NOTE — Assessment & Plan Note (Addendum)
While in atrial fibrillation has proven to be very hard to "rate control" she becomes very symptomatic due to congestive heart failure in the setting of hypertrophic cardiomyopathy. She is therefore an antiarrhythmic therapy with amiodarone. Current therapy is working well with less than 0.1% atrial fibrillation burden by her most recent pacemaker check today. She is on anticoagulation with warfarin and has generally maintained therapeutic levels without bleeding complications. Liver and thyroid function tests should be monitored at least twice yearly

## 2012-11-27 NOTE — Assessment & Plan Note (Signed)
Chest previously required stents placed to the diagonal artery and a dominant left circumflex coronary artery (2010). Her most recent cardiac catheterization 2012 showed 40-50% in-stent restenosis in the left circumflex coronary artery. Do any meaningful obstruction was a 99% stenosis in the small nondominant right coronary artery which was an old finding. She has no complaints of angina at this time. Unfortunately she is statin intolerant.

## 2012-11-27 NOTE — Assessment & Plan Note (Signed)
The device was necessary due to bradycardia that was symptomatic, secondary to treatment with amiodarone for atrial fibrillation. She has a dual chamber Medtronic device is programmed with a short AV delay for purposeful ventricular pacing, which we are achieving almost 100% of the time. The device is functioning well but she complains of palpitations with minimal activity which may be related to exaggerated sensor settings. We have increased the sensor onset threshold. No episodes of atrial fibrillation or ventricular tachyarrhythmias have been recorded on her most recent pacemaker check

## 2012-11-27 NOTE — Assessment & Plan Note (Signed)
7 remote sternotomy. This would make her at even higher risk for open heart procedures

## 2012-11-27 NOTE — Assessment & Plan Note (Signed)
Well-controlled. Beta blockers and central acting calcium channel blockers are preferred agents over vasodilators and diuretics.

## 2012-11-27 NOTE — Assessment & Plan Note (Signed)
By her most recent echocardiogram there appear to be less mitral insufficiency suggesting that a major component of the valvular problem is systolic anterior motion of the anterior mitral leaflet. Aggressive treatment with vasodilators is not recommended due to the presence of hypertrophic obstructive cardiomyopathy

## 2012-11-27 NOTE — Assessment & Plan Note (Signed)
It is hard to assess whether she is euvolemic. Use of diuretics has to be very cautious as he could worsen left ventricular outflow tract obstruction. Currently NYHA class III shortness of breath on exertion.

## 2012-12-02 ENCOUNTER — Encounter: Payer: Self-pay | Admitting: Cardiovascular Disease

## 2012-12-21 ENCOUNTER — Encounter: Payer: Self-pay | Admitting: Internal Medicine

## 2012-12-21 ENCOUNTER — Ambulatory Visit (INDEPENDENT_AMBULATORY_CARE_PROVIDER_SITE_OTHER): Payer: Medicare Other | Admitting: Internal Medicine

## 2012-12-21 ENCOUNTER — Other Ambulatory Visit: Payer: Self-pay | Admitting: Pharmacist Clinician (PhC)/ Clinical Pharmacy Specialist

## 2012-12-21 VITALS — BP 140/62 | HR 76 | Temp 97.7°F | Resp 16 | Ht 63.0 in | Wt 194.4 lb

## 2012-12-21 DIAGNOSIS — Z9981 Dependence on supplemental oxygen: Secondary | ICD-10-CM

## 2012-12-21 DIAGNOSIS — L659 Nonscarring hair loss, unspecified: Secondary | ICD-10-CM

## 2012-12-21 DIAGNOSIS — R0602 Shortness of breath: Secondary | ICD-10-CM

## 2012-12-21 DIAGNOSIS — E039 Hypothyroidism, unspecified: Secondary | ICD-10-CM

## 2012-12-21 DIAGNOSIS — I10 Essential (primary) hypertension: Secondary | ICD-10-CM

## 2012-12-21 DIAGNOSIS — I5032 Chronic diastolic (congestive) heart failure: Secondary | ICD-10-CM

## 2012-12-21 DIAGNOSIS — Z0000 Encounter for general adult medical examination without abnormal findings: Secondary | ICD-10-CM

## 2012-12-21 DIAGNOSIS — R0609 Other forms of dyspnea: Secondary | ICD-10-CM

## 2012-12-21 DIAGNOSIS — R0902 Hypoxemia: Secondary | ICD-10-CM

## 2012-12-21 DIAGNOSIS — R609 Edema, unspecified: Secondary | ICD-10-CM

## 2012-12-21 DIAGNOSIS — Z950 Presence of cardiac pacemaker: Secondary | ICD-10-CM

## 2012-12-21 LAB — POCT URINALYSIS DIPSTICK
Glucose, UA: NEGATIVE
Nitrite, UA: NEGATIVE
Protein, UA: 100
Spec Grav, UA: 1.02
Urobilinogen, UA: 0.2

## 2012-12-21 MED ORDER — WARFARIN SODIUM 2.5 MG PO TABS: ORAL_TABLET | ORAL | Status: DC

## 2012-12-21 NOTE — Progress Notes (Signed)
  Subjective:    Patient ID: Carla Black, female    DOB: [birth year], 79 y.o.   MRN: [id number]  HPI    Review of Systems  Constitutional: Positive for unexpected weight change.       Gain  HENT: Positive for postnasal drip.   Eyes: Negative.   Respiratory: Positive for shortness of breath.        Sometimes  Cardiovascular: Positive for leg swelling.  Gastrointestinal: Positive for abdominal pain and diarrhea.  Endocrine:       Change in hair-loosing  Thyroid abnormalities  Genitourinary: Negative.   Musculoskeletal: Negative.   Skin: Negative.   Allergic/Immunologic: Negative.   Neurological: Positive for dizziness, weakness and light-headedness.       Sometimes  Hematological: Bruises/bleeds easily.  Psychiatric/Behavioral: Negative.        Objective:   Physical Exam        Assessment & Plan:

## 2012-12-21 NOTE — Progress Notes (Signed)
Subjective:    Patient ID: Carla Black, female    DOB: 03-Dec-1927, 79 y.o.   MRN: [id number]  HPI Patient Active Problem List   Diagnosis Date Noted  . H/O thymoma 11/27/2012  . Chronic diastolic heart failure 11/27/2012  . Long term (current) use of anticoagulants 11/22/2012  . PAF (paroxysmal atrial fibrillation)-Was hospitalized in November 2012 and had very difficult cardioversion over several attempts   06/24/2012  . Symptomatic bradycardia 06/24/2012  . S/P cardiac pacemaker procedure, insertion Medtronic device 06/24/12 06/24/2012  . Hypertrophic obstructive cardiomyopathy(425.11)---see Dr Croitoro's notes from 11/2012 documenting help her periods her cardiovascular situation is currently due to the combination of her outflow obstruction, atrial fibrillation, and underlying cardiovascular disease  11/24/2011  . Chronic anticoagulation 11/24/2011  . Hypothyroid 06/02/2011  . 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 Hypertension   . Hyperlipidemia--- she is intolerant of statins    . GERD (gastroesophageal reflux disease)   . Hyperthyroidism/hypothyroidism    . Coronary artery disease, last cath 9/12-medical Rx     Presents for follow up, last seen with CHF symptoms 11/2011 and was sent to hospital, treated for pneumonia. Over the next 8 months she eventually received pacemaker as her cardiac status deteriorated with a slow rate and response to treatment for her arrhythmia in the setting of her outflow obstruction and deteriorating mitral valve. No longer experiencing as many episodes of rapid beats. Reports overall energy increase and is able to do her walking and play spades. Struggles to lift arms over her head. States at times she feels like she may fall down, but has not had falls yet. Feels like the last few weeks she has improved. Uses oxygen when she returns from dinner but does not  use it around the house for daily activities. Oxygen seems to help when she gets exhausted. Uses albuterol inhaler several days in a row, then doesn't need it for long periods of time.   Would like to lose weight. Denies over-eating. Understands some of her weight problem is related to swelling in lower extremities. Has had swelling in extremities all of her "mature adult life". Would like to have more energy.  States her hair is falling out, curious if B12 would improve hair.   Has [place] MOST form, would like assistance filing it out.    Past surgeries also include a removal of thymoma, a left carotid endarterectomy in 2000, a thyroidectomy in 1991 and cardiac stents in 2009 IMPRESSION of carotid dopplers 09/21/11 1. 40%-59% right internal carotid artery stenosis.  2. Patent left carotid endarterectomy with minimal restenosis  observed.  3. Bilateral external carotid artery stenosis right worse than left.  4. Bilateral vertebral arteries are antegrade.   Review of Systems As recorded by CNA   Review of Systems  Constitutional: Positive for unexpected weight change.       Gain  HENT: Positive for postnasal drip.   Eyes: Negative.   Respiratory: Positive for shortness of breath.        Sometimes  Cardiovascular: Positive for leg swelling.  Gastrointestinal: Positive for abdominal pain and diarrhea.  Endocrine:       Change in hair-loosing  Thyroid abnormalities  Genitourinary: Negative.   Musculoskeletal: Negative.   Skin: Negative.   Allergic/Immunologic: Negative.   Neurological: Positive for dizziness, weakness and light-headedness.       Sometimes  Hematological: Bruises/bleeds easily.  Psychiatric/Behavioral: Negative.  Objective:   Physical Exam  BP 140/62  Pulse 76  Temp(Src) 97.7 F (36.5 C) (Oral)  Resp 16  Ht 5\' 3"  (1.6 m)  Wt 194 lb 6.4 oz (88.179 kg)  BMI 34.44 kg/m2  SpO2 88%  General: WDWN female, appears stated age, NAD despite pulse ox of 88% HEENT:  normocephalic, sclera/conjunctiva clear and PERRLA with full EOMs-can sustain upward gaze,  moist mucous membranes, neck supple, no JVD/+bruit on R(known) Resp: good air entry, CTA anterior & posterior fields bilaterally, without rales rhonchi wheezes  Cardiac: RRR,pacemaker visible //systolic murmur very quiet today Extremities: moves all limbs spontaneously, pitting edema 2+ bilaterally to knee //perip pulses intact Fatigues quickly with arms, shoulders overhead but no pain w/ shoulder rom Neuro: A&O x 3, CN II-XII grossly intact Mood good/thought content appropriate Skin: no rashes, lesions//hair loss is generalized without alopecia patches     Assessment & Plan:  Multiple problems as noted We spent time discussing her preparation for end of life/at this point she would favor immediate measures to rescue her but would not be in favor of long-term ventilator use, long term feeding if she becomes incompetent etc//Filled out [place] MOST form, counseled on Health Care Power of [employer] Friends - daughter in [place] (not sure where the paperwork is).   Note sent to [facility] facility to ask for physical therapy in an attempt to improve overall physical conditioning/Discussed possible benefits of PT for conditioning and rehabilitation along with benefits of water aerobics (available at her residence at [facility]).   Her mechanism for hypoxemia is still unclear to me-there doesn't appear to be VQ mismatch or right to left shunt/she has not been diagnosed with idiopathic pulmonary fibrosis or chronic lung disease/hypoventilation from a neuromuscular problem should be considered though there are no physical signs of myasthenia gravis or polymyalgia rheumatica, other muscle diseases.  Routine labs and will Check B12. Check TSH,ESR  Results for orders placed in visit on 12/21/12  VITAMIN B12      Result Value Range   Vitamin B-12 808  211 - 911 pg/mL  SEDIMENTATION RATE      Result Value Range   Sed  Rate 1  0 - 22 mm/hr  T4, FREE      Result Value Range   Free T4 2.03 (*) 0.80 - 1.80 ng/dL  COMPREHENSIVE METABOLIC PANEL      Result Value Range   Sodium 141  135 - 145 mEq/L   Potassium 4.2  3.5 - 5.3 mEq/L   Chloride 100  96 - 112 mEq/L   CO2 33 (*) 19 - 32 mEq/L   Glucose, Bld 100 (*) 70 - 99 mg/dL   BUN 30 (*) 6 - 23 mg/dL   Creat 1.61 (*) 0.96 - 1.10 mg/dL   Total Bilirubin 0.4  0.3 - 1.2 mg/dL   Alkaline Phosphatase 89  39 - 117 U/L   AST 28  0 - 37 U/L   ALT 24  0 - 35 U/L   Total Protein 7.0  6.0 - 8.3 g/dL   Albumin 3.7  3.5 - 5.2 g/dL   Calcium 8.8  8.4 - 04.5 mg/dL  CBC WITH DIFFERENTIAL      Result Value Range   WBC 5.3  4.0 - 10.5 K/uL   RBC 4.60  3.87 - 5.11 MIL/uL   Hemoglobin 13.5  12.0 - 15.0 g/dL   HCT 40.9  81.1 - 91.4 %   MCV 90.2  78.0 - 100.0 fL  MCH 29.3  26.0 - 34.0 pg   MCHC 32.5  30.0 - 36.0 g/dL   RDW 16.1 (*) 09.6 - 04.5 %   Platelets 191  150 - 400 K/uL   Neutrophils Relative % 53  43 - 77 %   Neutro Abs 2.8  1.7 - 7.7 K/uL   Lymphocytes Relative 34  12 - 46 %   Lymphs Abs 1.8  0.7 - 4.0 K/uL   Monocytes Relative 10  3 - 12 %   Monocytes Absolute 0.6  0.1 - 1.0 K/uL   Eosinophils Relative 3  0 - 5 %   Eosinophils Absolute 0.2  0.0 - 0.7 K/uL   Basophils Relative 0  0 - 1 %   Basophils Absolute 0.0  0.0 - 0.1 K/uL   Smear Review Criteria for review not met    TSH      Result Value Range   TSH 0.826  0.350 - 4.500 uIU/mL  POCT URINALYSIS DIPSTICK      Result Value Range   Color, UA yellow     Clarity, UA clear     Glucose, UA neg     Bilirubin, UA neg     Ketones, UA neg     Spec Grav, UA 1.020     Blood, UA neg     pH, UA 6.5     Protein, UA 100     Urobilinogen, UA 0.2     Nitrite, UA neg     Leukocytes, UA Negative       Physician Asst. student [place] participated in this encounter for educational purposes

## 2012-12-22 ENCOUNTER — Ambulatory Visit (INDEPENDENT_AMBULATORY_CARE_PROVIDER_SITE_OTHER): Payer: Medicare Other | Admitting: Pharmacist Clinician (PhC)/ Clinical Pharmacy Specialist

## 2012-12-22 VITALS — BP 128/66 | HR 92

## 2012-12-22 DIAGNOSIS — Z7901 Long term (current) use of anticoagulants: Secondary | ICD-10-CM

## 2012-12-22 LAB — COMPREHENSIVE METABOLIC PANEL
ALT: 24 U/L (ref 0–35)
Albumin: 3.7 g/dL (ref 3.5–5.2)
CO2: 33 mEq/L — ABNORMAL HIGH (ref 19–32)
Calcium: 8.8 mg/dL (ref 8.4–10.5)
Chloride: 100 mEq/L (ref 96–112)
Glucose, Bld: 100 mg/dL — ABNORMAL HIGH (ref 70–99)
Sodium: 141 mEq/L (ref 135–145)
Total Protein: 7 g/dL (ref 6.0–8.3)

## 2012-12-22 LAB — TSH: TSH: 0.826 u[IU]/mL (ref 0.350–4.500)

## 2012-12-22 LAB — CBC WITH DIFFERENTIAL/PLATELET
Basophils Absolute: 0 10*3/uL (ref 0.0–0.1)
Eosinophils Relative: 3 % (ref 0–5)
Lymphocytes Relative: 34 % (ref 12–46)
Lymphs Abs: 1.8 10*3/uL (ref 0.7–4.0)
Neutro Abs: 2.8 10*3/uL (ref 1.7–7.7)
Neutrophils Relative %: 53 % (ref 43–77)
Platelets: 191 10*3/uL (ref 150–400)
RBC: 4.6 MIL/uL (ref 3.87–5.11)
RDW: 16.3 % — ABNORMAL HIGH (ref 11.5–15.5)
WBC: 5.3 10*3/uL (ref 4.0–10.5)

## 2012-12-22 LAB — T4, FREE: Free T4: 2.03 ng/dL — ABNORMAL HIGH (ref 0.80–1.80)

## 2012-12-22 LAB — POCT INR: INR: 2.2

## 2012-12-22 MED ORDER — WARFARIN SODIUM 2.5 MG PO TABS: ORAL_TABLET | ORAL | Status: DC

## 2012-12-24 DIAGNOSIS — R0902 Hypoxemia: Secondary | ICD-10-CM | POA: Insufficient documentation

## 2012-12-25 ENCOUNTER — Encounter: Payer: Self-pay | Admitting: Internal Medicine

## 2012-12-26 NOTE — Progress Notes (Signed)
[facility] is on EPIC so Dr. Landry Mellow has access to record. You can send him EPIC message to make sure he sees notes if you would prefer.

## 2012-12-28 ENCOUNTER — Other Ambulatory Visit: Payer: Self-pay | Admitting: Family Medicine

## 2012-12-29 ENCOUNTER — Telehealth: Payer: Self-pay

## 2012-12-29 NOTE — Telephone Encounter (Signed)
Patient had blood work to determine her medication needs.  Thyroid and B-12 levels.  She wants to talk to someone.  Do not leave a message, she is going to a funeral this afternoon.   Please call [phone]

## 2012-12-29 NOTE — Telephone Encounter (Signed)
Called, no answer, did not leave message

## 2012-12-30 NOTE — Telephone Encounter (Signed)
Please advise on thyroid meds, do you want her to continue current treatment?

## 2013-01-01 ENCOUNTER — Other Ambulatory Visit: Payer: Self-pay | Admitting: Internal Medicine

## 2013-01-01 DIAGNOSIS — E039 Hypothyroidism, unspecified: Secondary | ICD-10-CM

## 2013-01-01 MED ORDER — LEVOTHYROXINE SODIUM 100 MCG PO TABS: 100.0000 ug | ORAL_TABLET | Freq: Every day | ORAL | Status: DC

## 2013-01-02 ENCOUNTER — Telehealth: Payer: Self-pay

## 2013-01-02 NOTE — Telephone Encounter (Signed)
Nurse at [facility] called and LM on my VM stating that they have been trying to get some clarification on PT orders from Dr Merla Riches. They refaxed new orders to be signed by Dr Merla Riches if he agrees. PT would like to get OT in to eval and treat pt also. I have put the orders in Dr Doolittle's box for review.

## 2013-01-03 ENCOUNTER — Telehealth: Payer: Self-pay | Admitting: Family Medicine

## 2013-01-03 NOTE — Telephone Encounter (Signed)
Talked with C.Walden, RN from [facility]. She did receive the signed order for OT this morning.

## 2013-02-01 ENCOUNTER — Other Ambulatory Visit: Payer: Self-pay | Admitting: *Deleted

## 2013-02-01 MED ORDER — QUINAPRIL HCL 20 MG PO TABS: 20.0000 mg | ORAL_TABLET | Freq: Every day | ORAL | Status: DC

## 2013-02-03 ENCOUNTER — Ambulatory Visit (INDEPENDENT_AMBULATORY_CARE_PROVIDER_SITE_OTHER): Payer: Medicare Other | Admitting: Pharmacist Clinician (PhC)/ Clinical Pharmacy Specialist

## 2013-02-03 VITALS — BP 140/76 | HR 76

## 2013-02-03 DIAGNOSIS — Z7901 Long term (current) use of anticoagulants: Secondary | ICD-10-CM

## 2013-02-03 NOTE — Telephone Encounter (Signed)
This was done,see next phone message.

## 2013-02-27 ENCOUNTER — Telehealth: Payer: Self-pay | Admitting: Cardiovascular Disease

## 2013-02-27 NOTE — Telephone Encounter (Signed)
Returned call to [place].  Stated she received fax back from Dr. Royann Shivers r/t pt not needing pre-medication for dental procedure.  Stated pt didn't take that well and still wants something as she is nervous.  Marcelino Duster asked if it would be okay for pt to take amoxicillin prior for procedure.  Informed that if Dr. Royann Shivers stated it wasn't necessary, then RN must follow that advice.  Also informed if the dentist would like to rx something anyway, then he/she can make that determination.  Marcelino Duster asked if there would be any interactions with pt's meds and amoxicillin.  Advised she contact pharmacy as pt may be on other meds not rx'd by our office and they will be able to provide the most up-to-date information.  Marcelino Duster verbalized understanding and agreed w/ plan.

## 2013-02-27 NOTE — Telephone Encounter (Signed)
Dentist faxed info last week regarding premedication for patient.  Information was that patietn did not need premed.  Patient is questioning stating she prefers to have medication.

## 2013-03-17 ENCOUNTER — Encounter: Payer: Self-pay | Admitting: Cardiovascular Disease

## 2013-03-17 ENCOUNTER — Ambulatory Visit (INDEPENDENT_AMBULATORY_CARE_PROVIDER_SITE_OTHER): Payer: Medicare Other | Admitting: Pharmacist Clinician (PhC)/ Clinical Pharmacy Specialist

## 2013-03-17 ENCOUNTER — Ambulatory Visit (INDEPENDENT_AMBULATORY_CARE_PROVIDER_SITE_OTHER): Payer: Medicare Other | Admitting: Cardiovascular Disease

## 2013-03-17 ENCOUNTER — Other Ambulatory Visit: Payer: Self-pay | Admitting: Cardiovascular Disease

## 2013-03-17 VITALS — BP 138/72 | HR 65 | Ht 63.5 in | Wt 198.5 lb

## 2013-03-17 DIAGNOSIS — Z7901 Long term (current) use of anticoagulants: Secondary | ICD-10-CM

## 2013-03-17 DIAGNOSIS — I498 Other specified cardiac arrhythmias: Secondary | ICD-10-CM

## 2013-03-17 DIAGNOSIS — I421 Obstructive hypertrophic cardiomyopathy: Secondary | ICD-10-CM

## 2013-03-17 DIAGNOSIS — R001 Bradycardia, unspecified: Secondary | ICD-10-CM

## 2013-03-17 DIAGNOSIS — I2510 Atherosclerotic heart disease of native coronary artery without angina pectoris: Secondary | ICD-10-CM

## 2013-03-17 DIAGNOSIS — Z79899 Other long term (current) drug therapy: Secondary | ICD-10-CM

## 2013-03-17 DIAGNOSIS — I480 Paroxysmal atrial fibrillation: Secondary | ICD-10-CM

## 2013-03-17 DIAGNOSIS — I059 Rheumatic mitral valve disease, unspecified: Secondary | ICD-10-CM

## 2013-03-17 DIAGNOSIS — Z950 Presence of cardiac pacemaker: Secondary | ICD-10-CM

## 2013-03-17 DIAGNOSIS — I340 Nonrheumatic mitral (valve) insufficiency: Secondary | ICD-10-CM

## 2013-03-17 DIAGNOSIS — E785 Hyperlipidemia, unspecified: Secondary | ICD-10-CM

## 2013-03-17 DIAGNOSIS — I4891 Unspecified atrial fibrillation: Secondary | ICD-10-CM

## 2013-03-17 DIAGNOSIS — I5032 Chronic diastolic (congestive) heart failure: Secondary | ICD-10-CM

## 2013-03-17 LAB — PACEMAKER DEVICE OBSERVATION
ATRIAL PACING PM: 99.9
BAMS-0001: 150 {beats}/min
RV LEAD IMPEDENCE PM: 510 Ohm

## 2013-03-17 MED ORDER — FUROSEMIDE 40 MG PO TABS: 40.0000 mg | ORAL_TABLET | ORAL | Status: DC

## 2013-03-17 NOTE — Patient Instructions (Addendum)
Remote monitoring is used to monitor your Pacemaker from home. This monitoring reduces the number of office visits required to check your device to one time per year. It allows [place] to keep an eye on the functioning of your device to ensure it is working properly. You are scheduled for a device check from home on 06-19-2013. You may send your transmission at any time that day. If you have a wireless device, the transmission will be sent automatically. After your physician reviews your transmission, you will receive a postcard with your next transmission date.  Your physician has recommended you make the following change in your medication: TAKE FUROSEMIDE 40 MG 3 DAYS A WEEK  Your physician recommends that you return for lab work in:  3 WEEKS  Your physician recommends that you schedule a follow-up appointment in: 4 MONTHS

## 2013-03-17 NOTE — Progress Notes (Signed)
In office pacemaker interrogation. Normal device function. No changes made this session. 

## 2013-03-21 DIAGNOSIS — I5032 Chronic diastolic (congestive) heart failure: Secondary | ICD-10-CM | POA: Insufficient documentation

## 2013-03-21 NOTE — Assessment & Plan Note (Signed)
I suspect this is currently the most likely cause of her symptoms of dyspnea. She has NYHA functional class IIIB with occasional exacerbation to class IV. I have recommended that she increase her furosemide dose to 20 mg scheduled to 3 days a week Monday Wednesday and Friday. She's very concerned about the potential of worsening renal function but I reassured her that this is still a low dose of diuretic and that we will periodically monitor her kidney problem. In fact her most recent creatinine was better than before. She is neither a good candidate for surgery, nor does she wish to undergo any surgical procedures for her mitral valve.

## 2013-03-21 NOTE — Assessment & Plan Note (Signed)
Despite attempts with multiple agents she has not tolerated any statin therapy

## 2013-03-21 NOTE — Assessment & Plan Note (Signed)
Dual-chamber Medtronic pacemaker. Pacemaker check in clinic. Normal device function. Thresholds, sensing, impedances consistent with previous measurements. Device programmed to maximize longevity. No mode switch or high ventricular rates noted.  Histogram distribution appropriate for patient activity level. Device programmed to promote RV pacing, with a short AV delay, for HOCM. Estimated longevity 11 years. Patient will follow up remotely on 06-19-2013, and with [facility] in 4 months.

## 2013-03-21 NOTE — Assessment & Plan Note (Signed)
We are purposely pacing the right ventricle in an effort to reduce obstructive symptoms. Because of the complex pathophysiology it is hard to say how successful we are being in mitigating hemodynamic complications. By exam she has a loud systolic murmur but  it is difficult to separate the murmur of subvalvular dynamic obstruction from that of eccentric mitral insufficiency dueto ruptured chordae tendineae

## 2013-03-21 NOTE — Assessment & Plan Note (Signed)
Currently asymptomatic without angina or other signs of coronary insufficiency. She has remote history of stents placed to the diagonal artery and circumflex coronary artery in 2010, patent by cardiac catheterization September 2012.

## 2013-03-21 NOTE — Assessment & Plan Note (Signed)
She is doing much better on Amiodarone therapy. When she has atrial fibrillation she has very difficult rate control. She also tolerates arrhythmia poorly with worsening congestive heart failure. Check thyroid function tests and liver function tests twice yearly. Monitor for pulmonary, skin and ophthalmological complications. Continue warfarin anticoagulation which is almost always in the therapeutic range.

## 2013-03-21 NOTE — Progress Notes (Signed)
Patient ID: Carla Black, female   DOB: 1928-02-25, 79 y.o.   MRN: [id number]     Reason for office visit Followup congestive heart failure, hypertrophic obstructive cardiomyopathy, mitral insufficiency, paroxysmal atrial fibrillation, coronary artery disease.  Carla Black has a very complex cardiac pathophysiology. She has hypertrophic obstructive cardiomyopathy and at times has had severe LV outflow tract gradients. She also has severe mitral insufficiency. This was felt to possibly secondary to systolic anterior motion of the mitral valve, but by transesophageal echocardiography she was found to have ruptured chordae tendinae and a flail leaflet. A couple of years ago she had a protracted hospitalization secondary to atrial fibrillation with rapid ventricular response that led to congestive heart failure.  Eventually we achieved a semblance of compensation with amiodarone therapy but this led to symptomatic bradycardia she subsequently received a dual-chamber permanent pacemaker in January of this year.  Also has coronary disease but this has not been a real problem since 2010.   She is very sedentary, but despite this still has shortness of breath. She often has to sleep upright. She spends most of her nights in a recliner and has bilateral ankle edema. She only takes furosemide occasionally, on the average 2-3 times a week 20 mg at a time. She is very wary of using the diuretic because of a history of renal insufficiency.    Allergies  Allergen Reactions  . Metoprolol Other (See Comments)    Bradycardia  . Pseudoephedrine Other (See Comments)    Insomnia (took 1 tablet and could not sleep for 3 days)  . Statins Other (See Comments)    myalgia  . Codeine Nausea Only and Other (See Comments)    dizziness  . Seldane [Terfenadine] Nausea Only and Other (See Comments)    dizziness    Current Outpatient Prescriptions  Medication Sig Dispense Refill  . acetaminophen (TYLENOL) 325  MG tablet Take 1-2 tablets (325-650 mg total) by mouth every 4 (four) hours as needed.      Marland Kitchen amiodarone (PACERONE) 200 MG tablet Take 200 mg by mouth every morning.       Marland Kitchen aspirin 81 MG tablet Take 81 mg by mouth daily.       Marland Kitchen diltiazem (CARDIZEM CD) 180 MG 24 hr capsule Take 180 mg by mouth every evening.       Marland Kitchen guaiFENesin (MUCINEX) 600 MG 12 hr tablet Take 1,200 mg by mouth 2 (two) times daily.      Marland Kitchen levalbuterol (XOPENEX) 0.31 MG/3ML nebulizer solution Take 1 ampule by nebulization 3 (three) times daily.      Marland Kitchen levothyroxine (SYNTHROID, LEVOTHROID) 100 MCG tablet Take 1 tablet (100 mcg total) by mouth daily before breakfast. TAKE ONE TABLET BY MOUTH DAILY.  90 tablet  3  . meclizine (ANTIVERT) 25 MG tablet Take 25 mg by mouth as needed.      . metoprolol (LOPRESSOR) 100 MG tablet Take 150 mg by mouth 2 (two) times daily.       . mometasone (NASONEX) 50 MCG/ACT nasal spray Place 2 sprays into the nose 2 (two) times daily.      . Multiple Vitamins-Minerals (ICAPS AREDS FORMULA PO) Take 1 capsule by mouth 2 (two) times daily. PER PATIENT THIS IS ICAPS AREDS #2      . nitroGLYCERIN (NITROSTAT) 0.4 MG SL tablet Place 0.4 mg under the tongue every 5 (five) minutes as needed. For chest pain      . Omega 3-6-9 Fatty Acids (OMEGA 3-6-9 COMPLEX  PO) Take 1 capsule by mouth 2 (two) times daily.       . potassium chloride SA (K-DUR,KLOR-CON) 20 MEQ tablet Take 10 mEq by mouth daily. When taking furosemide       . PROAIR HFA 108 (90 BASE) MCG/ACT inhaler INHALE TWO PUFFS BY MOUTH EVERY 6 HOURS AS NEEDED  9 each  0  . Probiotic Product (ALIGN PO) Take 1 capsule by mouth every morning.       . quinapril (ACCUPRIL) 20 MG tablet Take 1 tablet (20 mg total) by mouth daily.  90 tablet  1  . warfarin (COUMADIN) 2.5 MG tablet Take 1-1.5 tablets by mouth daily as directed by coumadin clinic  135 tablet  2  . furosemide (LASIX) 40 MG tablet Take 1 tablet (40 mg total) by mouth as directed. Take 1 tablet on  Monday, Wednesday, Friday or as directed.  90 tablet  3   No current facility-administered medications for this visit.    Past Medical History  Diagnosis Date  . Hypertension   . Hyperlipidemia   . Heart murmur   . GERD (gastroesophageal reflux disease)   . Joint pain     left knee  . Dizziness   . Chest tightness 02/09/2008  . Hiatal hernia   . CHF (congestive heart failure)   . Angina   . Coronary artery disease   . Hypertrophic cardiomyopathy     subvalvular gradient 45-mm  . Dysrhythmia     hx of atrial fibrilation  . On home oxygen therapy     "2L at rest; 3L w/activity" (06/24/2012)  . Pacemaker 06/24/2012  . Melanoma of back 1961  . Complication of anesthesia   . Claustrophobia     "severe" (06/24/2012)  . Carotid artery occlusion   . Pneumonia     "lastest was 04/26/2011; multiple times before that" (06/24/2012)  . Chronic bronchitis     "used to get it often" (06/24/2012)  . Shortness of breath     "all the time" (06/24/2012)  . Hypothyroidism   . Stroke 2012    hx of TIA  . Arthritis     "2 fingers" (06/24/2012)  . PAF (paroxysmal atrial fibrillation) 06/24/2012  . Symptomatic bradycardia 06/24/2012  . S/P cardiac pacemaker procedure, insertion Medtronic device 06/24/12 06/24/2012  . Carotid artery disease 09/21/2011    40-59% R ICA stenosis; patent L carotid endarterectomy w/minimal restenosis; bilateral vertebral arteries are antegrade  . Cataract     Past Surgical History  Procedure Laterality Date  . 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 Microlaryngoscopy with co2 laser and excision of vocal cord lesion  1983  . Carotid endarterectomy  1990's    "left" (06/24/2012)  . Cardiac catheterization  03/14/2011    Stent CX 40% with nml ffr  . Tee without cardioversion  05/01/2011    Procedure: TRANSESOPHAGEAL ECHOCARDIOGRAM (TEE);  Surgeon: Thurmon Fair;  Location: [facility];  Service: Cardiovascular;  Laterality: N/A;  .  Cardioversion  05/01/2011    Procedure: CARDIOVERSION;  Surgeon: Rachelle Hora Chelbie Jarnagin;  Location: [facility];  Service: Cardiovascular;  Laterality: N/A;  . Cardioversion  06/30/2011    Procedure: CARDIOVERSION;  Surgeon: Thurmon Fair, MD;  Location: [facility] OR;  Service: Cardiovascular;  Laterality: N/A;  . Insert / replace / remove pacemaker  06/24/2012    dual chamber (06/24/2012)  . Tonsillectomy  1940's?  . Appendectomy  1940's  . Abdominal hysterectomy  1970's  .  Dilation and curettage of uterus  1960's - 1970's    "alot" (06/24/2012)  . Cataract extraction w/ intraocular lens  implant, bilateral  2000's  . Melanoma excision  1961    "off my back" (06/24/2012)  . Microlaryngoscopy with co2 laser and excision of vocal cord lesion    . Transthoracic echocardiogram  05/17/2012    see Procedures tab  . Cardiovascular stress test  09/13/2007    R/P MV - EF 56%; mild ischemia in basal and mid inferolateral regions; global LV systolic function normal; EKG negative for ischemia; pt experienced chest pain during stress test, resolved spontaneously  . Eye surgery      Family History  Problem Relation Age of Onset  . Heart disease Mother   . Stroke Mother   . Other Father     aneurysm  . Coronary artery disease Other   . Stroke Other     History   Social History  . Marital Status: Married    Spouse Name: N/A    Number of Children: N/A  . Years of Education: N/A   Occupational History  . Not on file.   Social History Main Topics  . Smoking status: Former Smoker -- 1.00 packs/day for 35 years    Types: Cigarettes    Quit date: 06/22/1989  . Smokeless tobacco: Never Used  . Alcohol Use: 0.0 oz/week     Comment: 06/24/2012 "suppose to drink 6oz wine qd; haven't had a glass in > 1 month"  . Drug Use: No  . Sexual Activity: No   Other Topics Concern  . Not on file   Social History Narrative  . No narrative on file    Review of systems: She has NYHA functional class IIIB dyspnea,  occasionally class IV. She denies syncope or palpitations. She did not have chest pain either at rest or with activity. She has not had for chronological complaints. She has chronic lower extremity edema that waxes and wanes in severity. She has not had focal neurological complaints or any bleeding problems. She denies abdominal pain nausea vomiting, dysphagia, change in bowel pattern, major weight changes.  PHYSICAL EXAM BP 138/72  Pulse 65  Ht 5' 3.5" (1.613 m)  Wt 198 lb 8 oz (90.039 kg)  BMI 34.61 kg/m2  General: Alert, oriented x3, no distress, moderately obese Head: no evidence of trauma, PERRL, EOMI, no exophtalmos or lid lag, no myxedema, no xanthelasma; normal ears, nose and oropharynx Neck: normal jugular venous pulsations and no hepatojugular reflux; brisk carotid pulses without delay and no carotid bruits Chest: clear to auscultation, no signs of consolidation by percussion or palpation, normal fremitus, symmetrical and full respiratory excursions; of a left subclavian pacemaker site Cardiovascular: normal position and quality of the apical impulse, regular rhythm, normal first and paradoxically split second heart sounds, no rubs or gallops, grade 2-3/6 systolic ejection murmur is heard throughout the anterior precordium from the aortic focus although it did the apex and worsens with a Valsalva maneuver. Heart is separated the whole systolic murmur of MR from the ejection murmur. Abdomen: no tenderness or distention, no masses by palpation, no abnormal pulsatility or arterial bruits, normal bowel sounds, no hepatosplenomegaly Extremities: no clubbing, cyanosis or edema; 2+ radial, ulnar and brachial pulses bilaterally; 2+ right femoral, posterior tibial and dorsalis pedis pulses; 2+ left femoral, posterior tibial and dorsalis pedis pulses; no subclavian or femoral bruits Neurological: grossly nonfocal   EKG: Atrial ventricular sequential  Lipid Panel     Component  Value Date/Time     CHOL 209* 11/25/2011 0530   TRIG 91 11/25/2011 0530   HDL 54 11/25/2011 0530   CHOLHDL 3.9 11/25/2011 0530   VLDL 18 11/25/2011 0530   LDLCALC 137* 11/25/2011 0530    BMET    Component Value Date/Time   NA 141 12/21/2012 1706   K 4.2 12/21/2012 1706   CL 100 12/21/2012 1706   CO2 33* 12/21/2012 1706   GLUCOSE 100* 12/21/2012 1706   BUN 30* 12/21/2012 1706   CREATININE 1.38* 12/21/2012 1706   CREATININE 1.28* 11/27/2011 0630   CALCIUM 8.8 12/21/2012 1706   GFRNONAA 38* 11/27/2011 0630   GFRAA 44* 11/27/2011 0630     ASSESSMENT AND PLAN Mitral insufficiency due to HOCM and 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 She has NYHA functional class IIIB with occasional exacerbation to class IV. I have recommended that she increase her furosemide dose to 20 mg scheduled to 3 days a week Monday Wednesday and Friday. She's very concerned about the potential of worsening renal function but I reassured her that this is still a low dose of diuretic and that we will periodically monitor her kidney problem. In fact her most recent creatinine was better than before. She is neither a good candidate for surgery, nor does she wish to undergo any surgical procedures for her mitral valve.  PAF (paroxysmal atrial fibrillation) She is doing much better on Amiodarone therapy. When she has atrial fibrillation she has very difficult rate control. She also tolerates arrhythmia poorly with worsening congestive heart failure. Check thyroid function tests and liver function tests twice yearly. Monitor for pulmonary, skin and ophthalmological complications. Continue warfarin anticoagulation which is almost always in the therapeutic range.  Hypertrophic obstructive cardiomyopathy(425.11)  We are purposely pacing the right ventricle in an effort to reduce obstructive symptoms. Because of the complex pathophysiology it is hard to say how successful we are being  in mitigating hemodynamic complications. By exam she has a loud systolic murmur but  it is difficult to separate the murmur of subvalvular dynamic obstruction from that of eccentric mitral insufficiency dueto ruptured chordae tendineae  S/P cardiac pacemaker procedure, insertion Medtronic device 06/24/12 Dual-chamber Medtronic pacemaker. Pacemaker check in clinic. Normal device function. Thresholds, sensing, impedances consistent with previous measurements. Device programmed to maximize longevity. No mode switch or high ventricular rates noted.  Histogram distribution appropriate for patient activity level. Device programmed to promote RV pacing, with a short AV delay, for HOCM. Estimated longevity 11 years. Patient will follow up remotely on 06-19-2013, and with [facility] in 4 months.   Coronary artery disease, last cath 9/12-medical Rx Currently asymptomatic without angina or other signs of coronary insufficiency. She has remote history of stents placed to the diagonal artery and circumflex coronary artery in 2010, patent by cardiac catheterization September 2012.  Hyperlipidemia Despite attempts with multiple agents she has not tolerated any statin therapy  Chronic diastolic heart failure, NYHA class 3b     Orders Placed This Encounter  Procedures  . Comp Met (CMET)  . EKG 12-Lead   Meds ordered this encounter  Medications  . furosemide (LASIX) 40 MG tablet    Sig: Take 1 tablet (40 mg total) by mouth as directed. Take 1 tablet on Monday, Wednesday, Friday or as directed.    Dispense:  90 tablet    Refill:  3    Devone Bonilla  Thurmon Fair, MD, [facility]  [facility] [phone] office [phone] pager

## 2013-04-04 IMAGING — CR DG CHEST 2V
2 series · 2 of 2 positions shown · non-contrast
Comparison: 06/20/2012

CLINICAL DATA: Status post AICD placement.  Shortness of breath.

CHEST - 2 VIEW

[w chest pa]
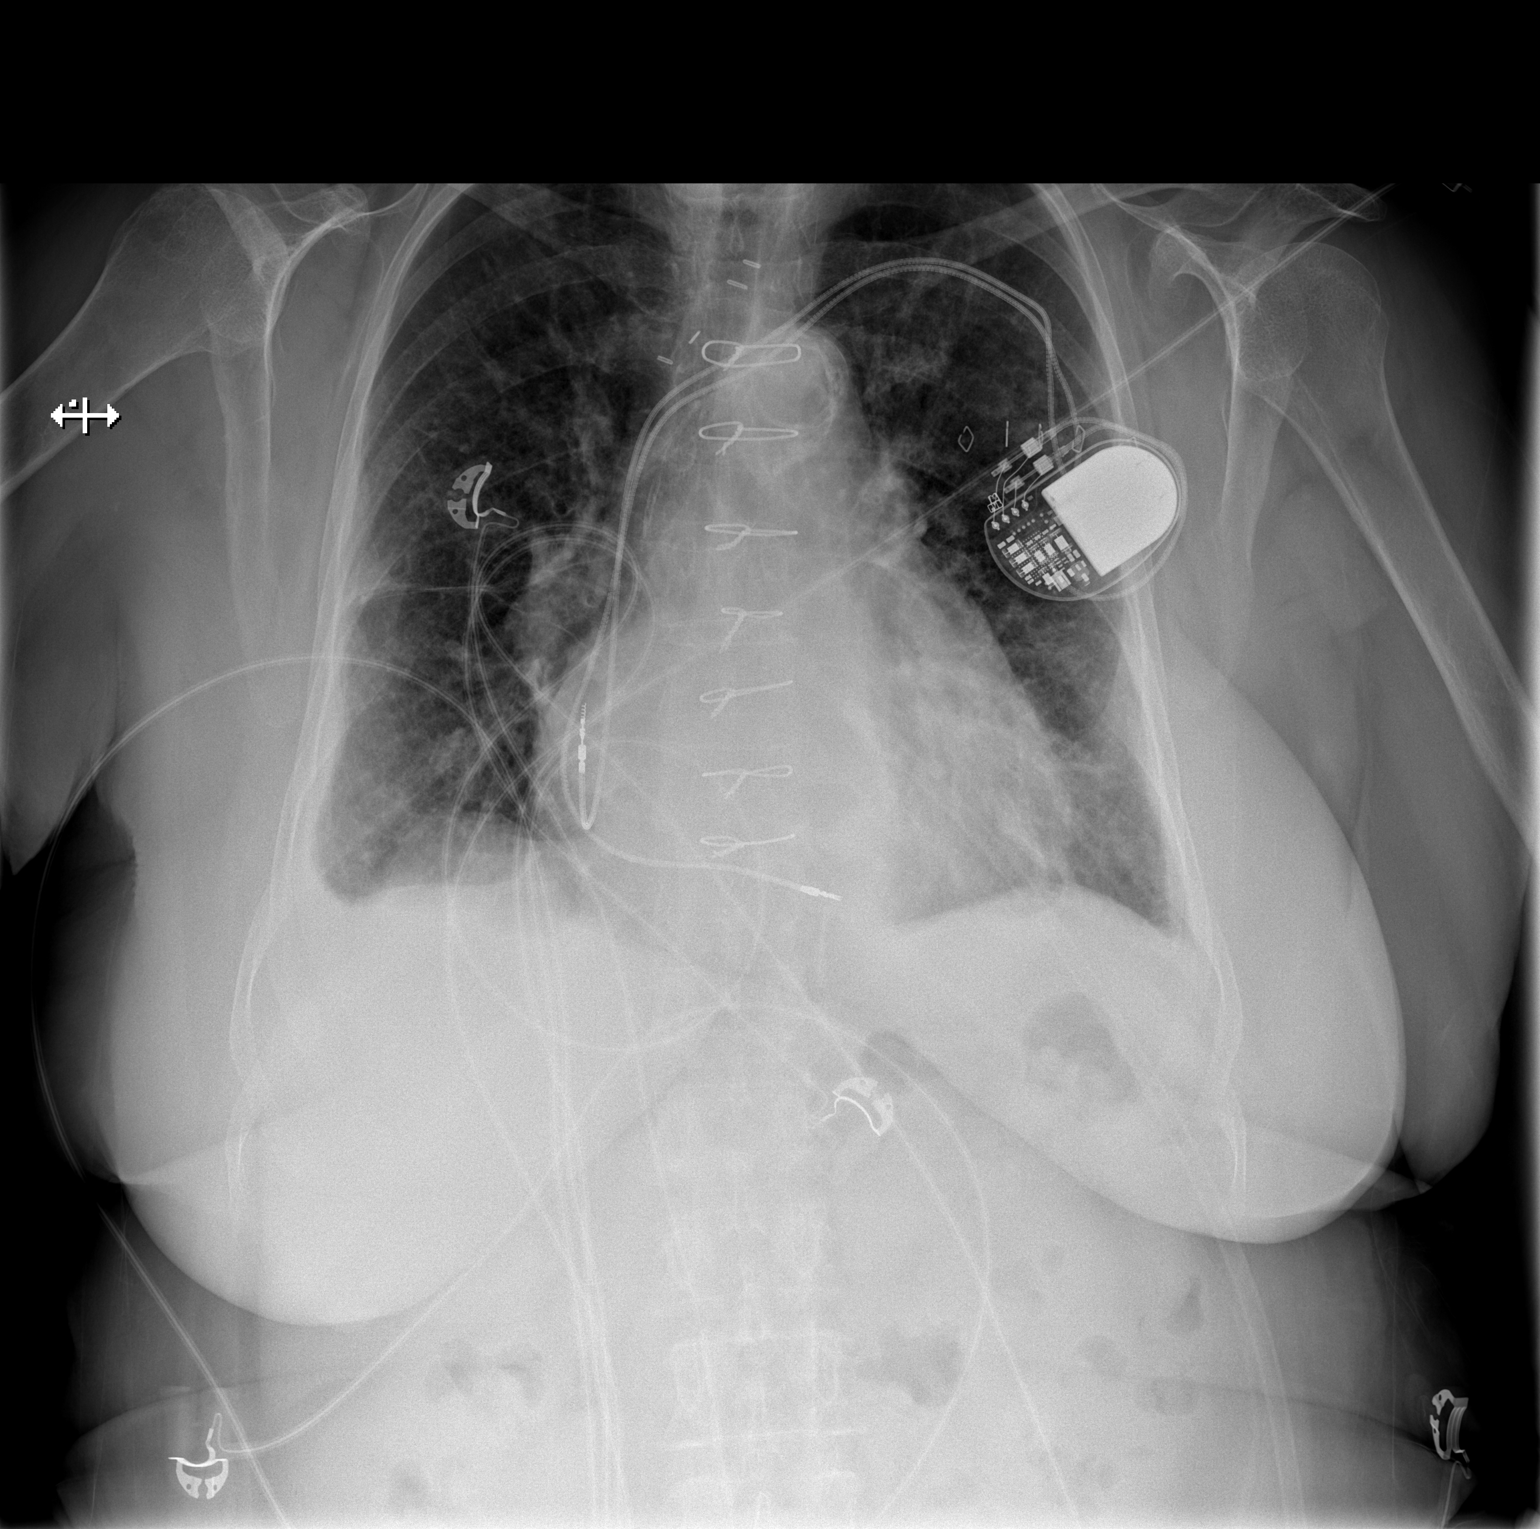

[w chest lat]
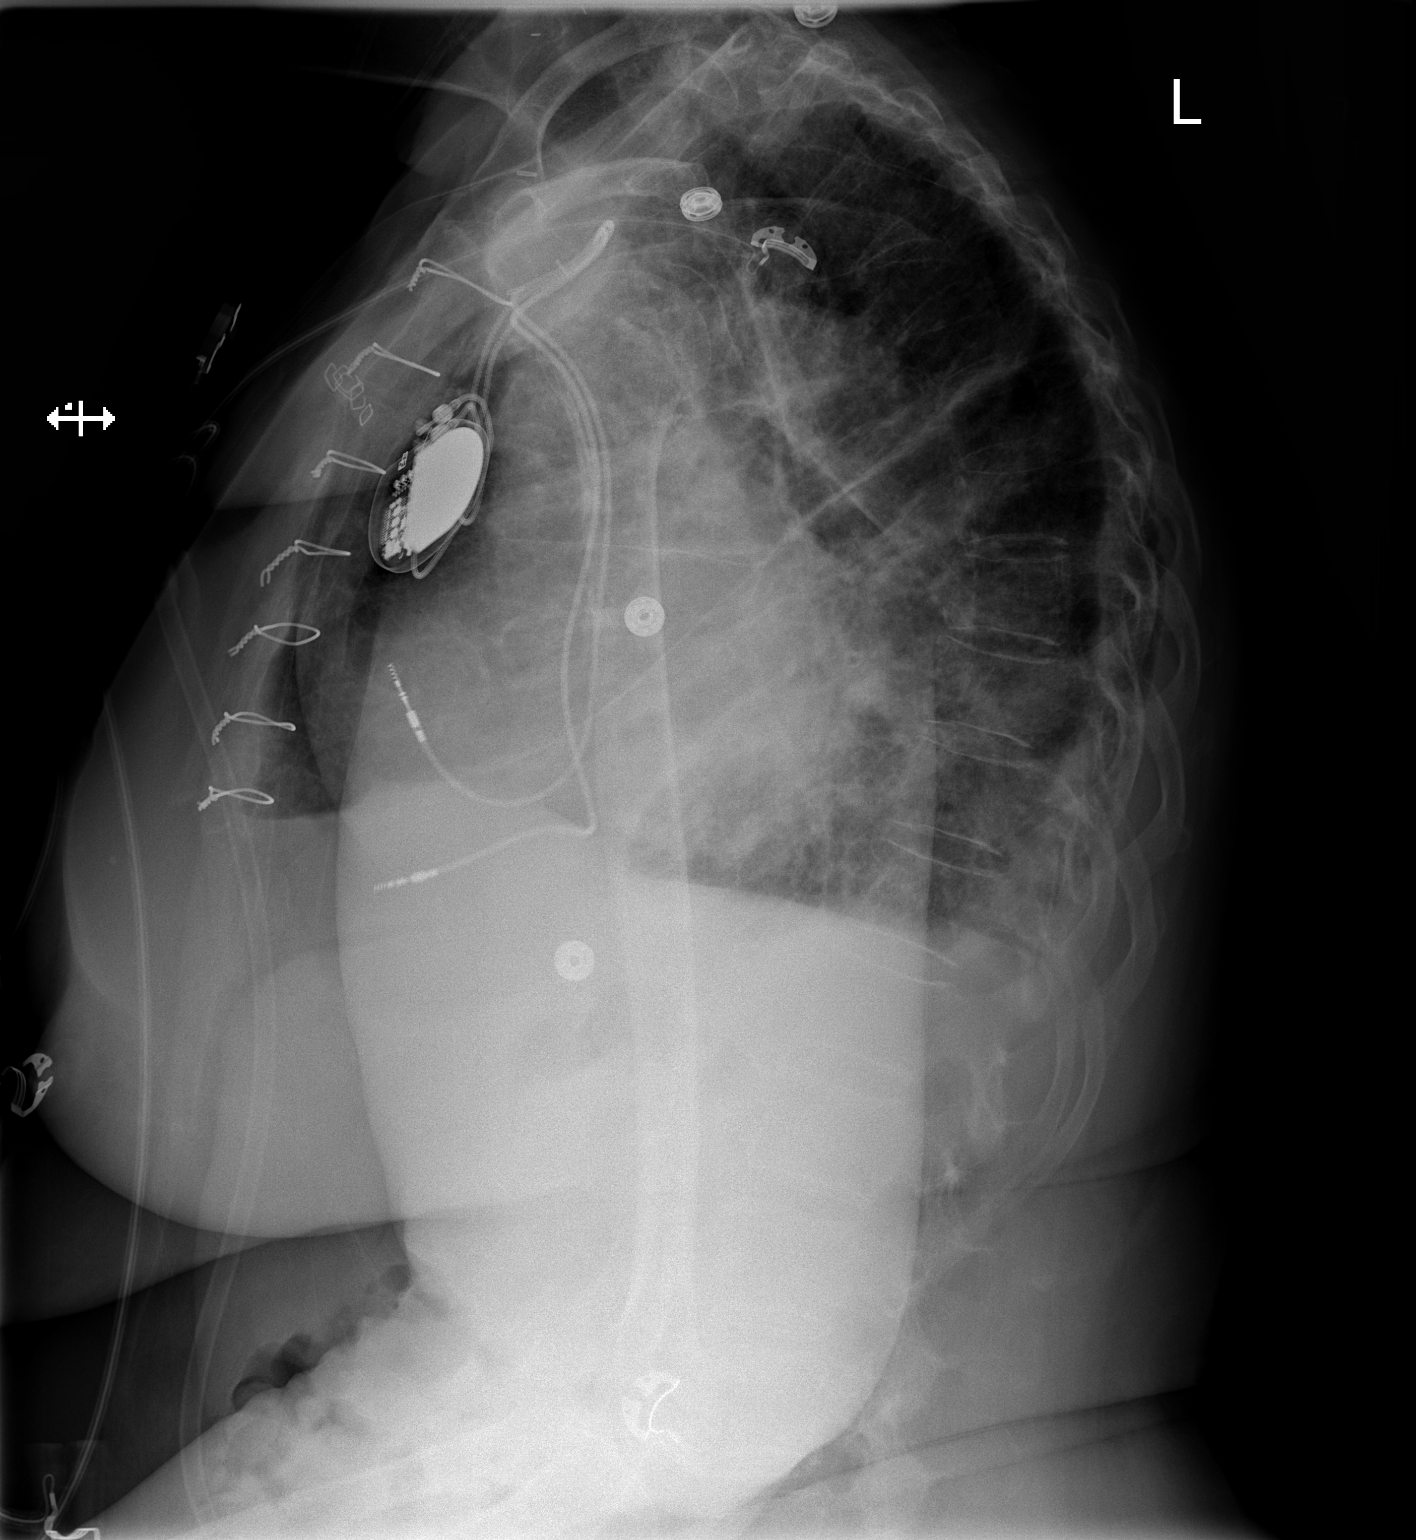

[2 of 2 positions shown; findings below may reference images not displayed]

FINDINGS: New AICD is seen in expected position with leads in the
right atrium and right ventricle.  No pneumothorax identified.

Cardiomegaly stable.  Mild diffuse interstitial infiltrates are
again seen, suspicious for mild interstitial edema.  A tiny right
pleural effusion is less likely increased in size.  Mild left
basilar atelectasis or scarring again demonstrated.
IMPRESSION: 1.  AICD in appropriate position.  No evidence of pneumothorax.
2.  Mild congestive heart failure, with slight increase in size of
tiny right pleural effusion.

## 2013-04-07 LAB — COMPREHENSIVE METABOLIC PANEL
BUN: 33 mg/dL — ABNORMAL HIGH (ref 6–23)
CO2: 32 mEq/L (ref 19–32)
Creat: 1.36 mg/dL — ABNORMAL HIGH (ref 0.50–1.10)
Glucose, Bld: 94 mg/dL (ref 70–99)
Total Bilirubin: 0.5 mg/dL (ref 0.3–1.2)
Total Protein: 7 g/dL (ref 6.0–8.3)

## 2013-04-10 ENCOUNTER — Telehealth: Payer: Self-pay | Admitting: *Deleted

## 2013-04-10 DIAGNOSIS — R6889 Other general symptoms and signs: Secondary | ICD-10-CM

## 2013-04-10 NOTE — Telephone Encounter (Signed)
Message copied by Vita Barley on Mon Apr 10, 2013  1:02 PM ------      Message from: Thurmon Fair      Created: Fri Apr 07, 2013  3:08 PM       Kidney function and K look good. Repeat CMET in one month for very mildly abnormal LFTs on amiodarone ------

## 2013-04-10 NOTE — Telephone Encounter (Signed)
Lab results called to patient.  Will have CMET rechecked in one month.  Voiced understanding.  Lab request mailed to patient.

## 2013-04-26 ENCOUNTER — Ambulatory Visit (INDEPENDENT_AMBULATORY_CARE_PROVIDER_SITE_OTHER): Payer: Medicare Other | Admitting: Pharmacist Clinician (PhC)/ Clinical Pharmacy Specialist

## 2013-04-26 VITALS — BP 134/58 | HR 76

## 2013-04-26 DIAGNOSIS — Z7901 Long term (current) use of anticoagulants: Secondary | ICD-10-CM

## 2013-04-27 ENCOUNTER — Ambulatory Visit: Payer: Medicare Other | Admitting: Pharmacist Clinician (PhC)/ Clinical Pharmacy Specialist

## 2013-05-12 LAB — COMPREHENSIVE METABOLIC PANEL
Alkaline Phosphatase: 72 U/L (ref 39–117)
Glucose, Bld: 87 mg/dL (ref 70–99)
Sodium: 141 mEq/L (ref 135–145)
Total Bilirubin: 0.6 mg/dL (ref 0.3–1.2)
Total Protein: 6.9 g/dL (ref 6.0–8.3)

## 2013-05-24 ENCOUNTER — Ambulatory Visit (INDEPENDENT_AMBULATORY_CARE_PROVIDER_SITE_OTHER): Payer: Medicare Other

## 2013-05-24 DIAGNOSIS — I4891 Unspecified atrial fibrillation: Secondary | ICD-10-CM

## 2013-05-24 LAB — PACEMAKER DEVICE OBSERVATION

## 2013-06-03 ENCOUNTER — Encounter: Payer: Self-pay | Admitting: *Deleted

## 2013-06-05 LAB — MDC_IDC_ENUM_SESS_TYPE_REMOTE
Battery Remaining Longevity: 11.5
Battery Voltage: 2.79 V
Lead Channel Impedance Value: 510 Ohm
Lead Channel Pacing Threshold Amplitude: 0.875 V
Lead Channel Pacing Threshold Pulse Width: 0.4 ms
Lead Channel Setting Pacing Amplitude: 2 V
Lead Channel Setting Sensing Sensitivity: 0.5 mV

## 2013-06-09 ENCOUNTER — Ambulatory Visit (INDEPENDENT_AMBULATORY_CARE_PROVIDER_SITE_OTHER): Payer: Medicare Other | Admitting: Pharmacist Clinician (PhC)/ Clinical Pharmacy Specialist

## 2013-06-09 VITALS — BP 128/84 | HR 76

## 2013-06-09 DIAGNOSIS — Z7901 Long term (current) use of anticoagulants: Secondary | ICD-10-CM

## 2013-07-21 ENCOUNTER — Ambulatory Visit (INDEPENDENT_AMBULATORY_CARE_PROVIDER_SITE_OTHER): Payer: Medicare HMO | Admitting: Pharmacist Clinician (PhC)/ Clinical Pharmacy Specialist

## 2013-07-21 ENCOUNTER — Encounter: Payer: Self-pay | Admitting: Cardiovascular Disease

## 2013-07-21 ENCOUNTER — Ambulatory Visit (INDEPENDENT_AMBULATORY_CARE_PROVIDER_SITE_OTHER): Payer: Medicare HMO | Admitting: Cardiovascular Disease

## 2013-07-21 VITALS — BP 142/70 | HR 61 | Ht 63.5 in | Wt 196.3 lb

## 2013-07-21 DIAGNOSIS — Z86018 Personal history of other benign neoplasm: Secondary | ICD-10-CM

## 2013-07-21 DIAGNOSIS — I5033 Acute on chronic diastolic (congestive) heart failure: Secondary | ICD-10-CM

## 2013-07-21 DIAGNOSIS — I509 Heart failure, unspecified: Secondary | ICD-10-CM

## 2013-07-21 DIAGNOSIS — I421 Obstructive hypertrophic cardiomyopathy: Secondary | ICD-10-CM

## 2013-07-21 DIAGNOSIS — Z7901 Long term (current) use of anticoagulants: Secondary | ICD-10-CM

## 2013-07-21 DIAGNOSIS — I340 Nonrheumatic mitral (valve) insufficiency: Secondary | ICD-10-CM

## 2013-07-21 DIAGNOSIS — Z87898 Personal history of other specified conditions: Secondary | ICD-10-CM

## 2013-07-21 DIAGNOSIS — I480 Paroxysmal atrial fibrillation: Secondary | ICD-10-CM

## 2013-07-21 DIAGNOSIS — Z950 Presence of cardiac pacemaker: Secondary | ICD-10-CM

## 2013-07-21 DIAGNOSIS — I059 Rheumatic mitral valve disease, unspecified: Secondary | ICD-10-CM

## 2013-07-21 DIAGNOSIS — R0602 Shortness of breath: Secondary | ICD-10-CM

## 2013-07-21 DIAGNOSIS — Z79899 Other long term (current) drug therapy: Secondary | ICD-10-CM

## 2013-07-21 DIAGNOSIS — I4891 Unspecified atrial fibrillation: Secondary | ICD-10-CM

## 2013-07-21 LAB — POCT INR: INR: 3.5

## 2013-07-21 MED ORDER — FUROSEMIDE 40 MG PO TABS: ORAL_TABLET | ORAL | Status: DC

## 2013-07-21 NOTE — Patient Instructions (Addendum)
Remote monitoring is used to monitor your pacemaker from home. This monitoring reduces the number of office visits required to check your device to one time per year. It allows [place] to keep an eye on the functioning of your device to ensure it is working properly. You are scheduled for a device check from home on 08-24-2013. You may send your transmission at any time that day. If you have a wireless device, the transmission will be sent automatically. After your physician reviews your transmission, you will receive a postcard with your next transmission date.  Your physician recommends that you schedule a follow-up appointment in: 3 weeks  Increase furosemide to a whole 40 mg tablet every day. Continue taking this dose until the weight on your home scale is less than 190 pounds. After that go back to the previous schedule of 40 mg on Monday, Wednesday and Friday only. Check labs next Thursday, February 5

## 2013-07-26 DIAGNOSIS — I5033 Acute on chronic diastolic (congestive) heart failure: Secondary | ICD-10-CM | POA: Insufficient documentation

## 2013-07-26 NOTE — Progress Notes (Signed)
Patient ID: Carla Black, female   DOB: 12/29/1927, 80 y.o.   MRN: [id number]      Reason for office visit Worsening shortness of breath  Since her last visit 4 months ago she has increased dyspnea and edema, especially over the last several days. Not taking Lasix regularly because she is concerned about her kidneys.  Takes 1/2 of a 40mg  tablet 3 times a week.  Weights running 191.2 - 198.4. O2 Sat 92% wearing supplemental oxygen. It is hard to estimate her true dry weight but it is definitely less than 190 pounds.  Mrs. Yager has a very complex cardiac pathophysiology. She has hypertrophic obstructive cardiomyopathy and at times has had severe LV outflow tract gradients. She also has severe mitral insufficiency. This was felt to be possibly secondary to systolic anterior motion of the mitral valve, but by transesophageal echocardiography she was found to have ruptured chordae tendinae and a flail leaflet. A couple of years ago she had a protracted hospitalization secondary to atrial fibrillation with rapid ventricular response that led to congestive heart failure.  Eventually we achieved a semblance of compensation with amiodarone therapy but this led to symptomatic bradycardia she subsequently received a dual-chamber permanent pacemaker in January of this year.  Also has coronary disease but this has not been a real problem since 2010.  She is very sedentary, but despite this still has shortness of breath. She often has to sleep upright. She spends most of her nights in a recliner and has bilateral ankle edema. She only takes furosemide occasionally, on the average 2-3 times a week 20 mg at a time. She is very wary of using the diuretic because of a history of renal insufficiency, but her last creatinine level was actually better than before.    Allergies  Allergen Reactions  . Metoprolol Other (See Comments)    Bradycardia  . Pseudoephedrine Other (See Comments)    Insomnia (took 1  tablet and could not sleep for 3 days)  . Statins Other (See Comments)    myalgia  . Codeine Nausea Only and Other (See Comments)    dizziness  . Seldane [Terfenadine] Nausea Only and Other (See Comments)    dizziness    Current Outpatient Prescriptions  Medication Sig Dispense Refill  . acetaminophen (TYLENOL) 325 MG tablet Take 1-2 tablets (325-650 mg total) by mouth every 4 (four) hours as needed.      Marland Kitchen amiodarone (PACERONE) 200 MG tablet Take 200 mg by mouth every morning.       Marland Kitchen aspirin 81 MG tablet Take 81 mg by mouth daily.       Marland Kitchen diltiazem (CARDIZEM CD) 180 MG 24 hr capsule Take 180 mg by mouth every evening.       . furosemide (LASIX) 40 MG tablet Take one tablet daily until weight is below 190, then one tablet 3 times a week (Mon/Wed/Fri.  90 tablet  3  . guaiFENesin (MUCINEX) 600 MG 12 hr tablet Take 1,200 mg by mouth 2 (two) times daily.      Marland Kitchen levalbuterol (XOPENEX) 0.31 MG/3ML nebulizer solution Take 1 ampule by nebulization 3 (three) times daily.      Marland Kitchen levothyroxine (SYNTHROID, LEVOTHROID) 100 MCG tablet Take 1 tablet (100 mcg total) by mouth daily before breakfast. TAKE ONE TABLET BY MOUTH DAILY.  90 tablet  3  . meclizine (ANTIVERT) 25 MG tablet Take 25 mg by mouth as needed.      . metoprolol (LOPRESSOR) 100 MG tablet  Take 150 mg by mouth 2 (two) times daily.       . mometasone (NASONEX) 50 MCG/ACT nasal spray Place 2 sprays into the nose 2 (two) times daily.      . Multiple Vitamins-Minerals (ICAPS AREDS FORMULA PO) Take 1 capsule by mouth 2 (two) times daily. PER PATIENT THIS IS ICAPS AREDS #2      . nitroGLYCERIN (NITROSTAT) 0.4 MG SL tablet Place 0.4 mg under the tongue every 5 (five) minutes as needed. For chest pain      . Omega 3-6-9 Fatty Acids (OMEGA 3-6-9 COMPLEX PO) Take 1 capsule by mouth 2 (two) times daily.       . potassium chloride SA (K-DUR,KLOR-CON) 20 MEQ tablet Take 10 mEq by mouth daily. When taking furosemide       . PROAIR HFA 108 (90 BASE)  MCG/ACT inhaler INHALE TWO PUFFS BY MOUTH EVERY 6 HOURS AS NEEDED  9 each  0  . Probiotic Product (ALIGN PO) Take 1 capsule by mouth every morning.       . quinapril (ACCUPRIL) 20 MG tablet Take 1 tablet (20 mg total) by mouth daily.  90 tablet  1  . warfarin (COUMADIN) 2.5 MG tablet Take 1-1.5 tablets by mouth daily as directed by coumadin clinic  135 tablet  2   No current facility-administered medications for this visit.    Past Medical History  Diagnosis Date  . Hypertension   . Hyperlipidemia   . Heart murmur   . GERD (gastroesophageal reflux disease)   . Joint pain     left knee  . Dizziness   . Chest tightness 02/09/2008  . Hiatal hernia   . CHF (congestive heart failure)   . Angina   . Coronary artery disease   . Hypertrophic cardiomyopathy     subvalvular gradient 45-mm  . Dysrhythmia     hx of atrial fibrilation  . On home oxygen therapy     "2L at rest; 3L w/activity" (06/24/2012)  . Pacemaker 06/24/2012  . Melanoma of back 1961  . Complication of anesthesia   . Claustrophobia     "severe" (06/24/2012)  . Carotid artery occlusion   . Pneumonia     "lastest was 04/26/2011; multiple times before that" (06/24/2012)  . Chronic bronchitis     "used to get it often" (06/24/2012)  . Shortness of breath     "all the time" (06/24/2012)  . Hypothyroidism   . Stroke 2012    hx of TIA  . Arthritis     "2 fingers" (06/24/2012)  . PAF (paroxysmal atrial fibrillation) 06/24/2012  . Symptomatic bradycardia 06/24/2012  . S/P cardiac pacemaker procedure, insertion [employer] device 06/24/12 06/24/2012  . Carotid artery disease 09/21/2011    40-59% R ICA stenosis; patent L carotid endarterectomy w/minimal restenosis; bilateral vertebral arteries are antegrade  . Cataract     Past Surgical History  Procedure Laterality Date  . 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 Microlaryngoscopy with co2 laser and excision of vocal cord lesion  1983  . Carotid  endarterectomy  1990's    "left" (06/24/2012)  . Cardiac catheterization  03/14/2011    Stent CX 40% with nml ffr  . Tee without cardioversion  05/01/2011    Procedure: TRANSESOPHAGEAL ECHOCARDIOGRAM (TEE);  Surgeon: Sanda Klein;  Location: [place];  Service: Cardiovascular;  Laterality: N/A;  . Cardioversion  05/01/2011    Procedure: CARDIOVERSION;  Surgeon: Dani Gobble Skiler Olden;  Location: [place];  Service: Cardiovascular;  Laterality: N/A;  . Cardioversion  06/30/2011    Procedure: CARDIOVERSION;  Surgeon: Sanda Klein, MD;  Location: [place];  Service: Cardiovascular;  Laterality: N/A;  . Insert / replace / remove pacemaker  06/24/2012    dual chamber (06/24/2012)  . Tonsillectomy  1940's?  . Appendectomy  1940's  . Abdominal hysterectomy  1970's  . Dilation and curettage of uterus  1960's - 1970's    "alot" (06/24/2012)  . Cataract extraction w/ intraocular lens  implant, bilateral  2000's  . Melanoma excision  1961    "off my back" (06/24/2012)  . Microlaryngoscopy with co2 laser and excision of vocal cord lesion    . Transthoracic echocardiogram  05/17/2012    see Procedures tab  . Cardiovascular stress test  09/13/2007    R/P MV - EF 56%; mild ischemia in basal and mid inferolateral regions; global LV systolic function normal; EKG negative for ischemia; pt experienced chest pain during stress test, resolved spontaneously  . Eye surgery      Family History  Problem Relation Age of Onset  . Heart disease Mother   . Stroke Mother   . Other Father     aneurysm  . Coronary artery disease Other   . Stroke Other     History   Social History  . Marital Status: Married    Spouse Name: N/A    Number of Children: N/A  . Years of Education: N/A   Occupational History  . Not on file.   Social History Main Topics  . Smoking status: Former Smoker -- 1.00 packs/day for 35 years    Types: Cigarettes    Quit date: 06/22/1989  . Smokeless tobacco: Never Used  . Alcohol Use: 0.0  oz/week     Comment: 06/24/2012 "suppose to drink 6oz wine qd; haven't had a glass in > 1 month"  . Drug Use: No  . Sexual Activity: No   Other Topics Concern  . Not on file   Social History Narrative  . No narrative on file    Review of systems: Chronic shortness of breath at rest and with minimal activity, worsening edema, no angina. The patient specifically denies any chest pain at rest or with exertion, orthopnea, paroxysmal nocturnal dyspnea, syncope, palpitations, focal neurological deficits, intermittent claudication, unexplained weight gain, cough, hemoptysis or wheezing.  The patient is abdominal fullness and anorexia but  denies abdominal pain, nausea, vomiting, dysphagia, diarrhea, constipation, polyuria, polydipsia, dysuria, hematuria, frequency, urgency, abnormal bleeding or bruising, fever, chills, unexpected weight changes, mood swings, change in skin or hair texture, change in voice quality, auditory or visual problems, allergic reactions or rashes, new musculoskeletal complaints other than usual "aches and pains".   PHYSICAL EXAM BP 142/70  Pulse 61  Ht 5' 3.5" (1.613 m)  Wt 89.041 kg (196 lb 4.8 oz)  BMI 34.22 kg/m2  SpO2 92% General: Alert, oriented x3, no distress, moderately obese  Head: no evidence of trauma, PERRL, EOMI, no exophtalmos or lid lag, no myxedema, no xanthelasma; normal ears, nose and oropharynx  Neck: 8-10 cm elevation in jugular venous pulsations and prompt hepatojugular reflux; brisk carotid pulses without delay and no carotid bruits  Chest: clear to auscultation, no signs of consolidation by percussion or palpation, normal fremitus, symmetrical and full respiratory excursions; of a left subclavian pacemaker site  Cardiovascular: normal position and quality of the apical impulse, regular rhythm, normal first and paradoxically split second heart sounds, no rubs or  gallops, grade 2-3/6 systolic ejection murmur is heard throughout the anterior  precordium from the aortic focus although it did the apex and worsens with a Valsalva maneuver. Heart is separated the whole systolic murmur of MR from the ejection murmur.  Abdomen: no tenderness or distention, no masses by palpation, no abnormal pulsatility or arterial bruits, normal bowel sounds, no hepatosplenomegaly  Extremities: no clubbing, cyanosis ; 3+ bilateral calf edema; 2+ radial, ulnar and brachial pulses bilaterally; 2+ right femoral, posterior tibial and dorsalis pedis pulses; 2+ left femoral, posterior tibial and dorsalis pedis pulses; no subclavian or femoral bruits  Neurological: grossly nonfocal   EKG: AV sequential pacing  Lipid Panel     Component Value Date/Time   CHOL 209* 11/25/2011 0530   TRIG 91 11/25/2011 0530   HDL 54 11/25/2011 0530   CHOLHDL 3.9 11/25/2011 0530   VLDL 18 11/25/2011 0530   LDLCALC 137* 11/25/2011 0530    BMET    Component Value Date/Time   NA 141 05/12/2013 0940   K 4.4 05/12/2013 0940   CL 99 05/12/2013 0940   CO2 31 05/12/2013 0940   GLUCOSE 87 05/12/2013 0940   BUN 19 05/12/2013 0940   CREATININE 1.16* 05/12/2013 0940   CREATININE 1.28* 11/27/2011 0630   CALCIUM 8.5 05/12/2013 0940   GFRNONAA 38* 11/27/2011 0630   GFRAA 44* 11/27/2011 0630     ASSESSMENT AND PLAN  Mrs Whitelow has clear evidence of acute exacerbation of congestive heart failure with overt hypervolemia. Been taking tiny doses of loop diuretic which are clearly insufficient. Her cardiac pathophysiology is complicated with the presence of hypertrophic obstructive cardiomyopathy with systolic anterior motion but also severe mitral insufficiency due to ruptured chordae and a flail mitral valve. Suspected mitral insufficiency is the worst hemodynamic stressor at this time. We have previously discussed cardiac surgery and decided that this was high risk because of her age and due to the fact that she has previously had sternotomy for noncardiac surgery. Her current exacerbation cannot be  blamed on atrial fibrillation as her rhythm is currently atrial paced. At her last pacemaker check in December no atrial fibrillation had been detected for several months. Will increase loop diuretics. I reassured her that we'll monitor her renal function. Target for diuresis is to bring her weight at least under 190 pounds.  Patient Instructions  Remote monitoring is used to monitor your pacemaker from home. This monitoring reduces the number of office visits required to check your device to one time per year. It allows [place] to keep an eye on the functioning of your device to ensure it is working properly. You are scheduled for a device check from home on 08-24-2013. You may send your transmission at any time that day. If you have a wireless device, the transmission will be sent automatically. After your physician reviews your transmission, you will receive a postcard with your next transmission date.  Your physician recommends that you schedule a follow-up appointment in: 3 weeks  Increase furosemide to a whole 40 mg tablet every day. Continue taking this dose until the weight on your home scale is less than 190 pounds. After that go back to the previous schedule of 40 mg on Monday, Wednesday and Friday only. Check labs next Thursday, February 5      Orders Placed This Encounter  Procedures  . Basic Metabolic Panel (BMET)  . B Nat Peptide  . EKG 12-Lead   Meds ordered this encounter  Medications  . furosemide (LASIX)  40 MG tablet    Sig: Take one tablet daily until weight is below 190, then one tablet 3 times a week (Mon/Wed/Fri.    Dispense:  90 tablet    Refill:  [place] Abbee Cremeens, MD, [facility] [phone] office [phone] pager

## 2013-07-27 LAB — BASIC METABOLIC PANEL
BUN: 27 mg/dL — ABNORMAL HIGH (ref 6–23)
CO2: 37 mEq/L — ABNORMAL HIGH (ref 19–32)
Calcium: 9 mg/dL (ref 8.4–10.5)
Chloride: 96 mEq/L (ref 96–112)
Creat: 1.36 mg/dL — ABNORMAL HIGH (ref 0.50–1.10)
Glucose, Bld: 94 mg/dL (ref 70–99)
POTASSIUM: 4.4 meq/L (ref 3.5–5.3)
SODIUM: 140 meq/L (ref 135–145)

## 2013-07-28 LAB — BRAIN NATRIURETIC PEPTIDE: BRAIN NATRIURETIC PEPTIDE: 641.8 pg/mL — AB (ref 0.0–100.0)

## 2013-08-01 ENCOUNTER — Other Ambulatory Visit: Payer: Self-pay | Admitting: Cardiovascular Disease

## 2013-08-02 ENCOUNTER — Other Ambulatory Visit: Payer: Self-pay | Admitting: *Deleted

## 2013-08-02 MED ORDER — POTASSIUM CHLORIDE CRYS ER 20 MEQ PO TBCR: 10.0000 meq | EXTENDED_RELEASE_TABLET | Freq: Every day | ORAL | Status: DC

## 2013-08-02 NOTE — Telephone Encounter (Signed)
Rx was sent to pharmacy electronically. 

## 2013-08-10 ENCOUNTER — Other Ambulatory Visit: Payer: Self-pay | Admitting: *Deleted

## 2013-08-10 MED ORDER — DILTIAZEM HCL ER COATED BEADS 180 MG PO CP24: 180.0000 mg | ORAL_CAPSULE | Freq: Every evening | ORAL | Status: DC

## 2013-08-10 NOTE — Telephone Encounter (Signed)
Rx was sent to pharmacy electronically. 

## 2013-08-11 ENCOUNTER — Ambulatory Visit: Payer: Medicare Other | Admitting: Physician Assistant

## 2013-08-11 ENCOUNTER — Ambulatory Visit: Payer: Medicare Other | Admitting: Pharmacist Clinician (PhC)/ Clinical Pharmacy Specialist

## 2013-08-24 ENCOUNTER — Ambulatory Visit (INDEPENDENT_AMBULATORY_CARE_PROVIDER_SITE_OTHER): Payer: Medicare HMO | Admitting: *Deleted

## 2013-08-24 ENCOUNTER — Encounter: Payer: Self-pay | Admitting: Cardiovascular Disease

## 2013-08-24 DIAGNOSIS — I480 Paroxysmal atrial fibrillation: Secondary | ICD-10-CM

## 2013-08-24 DIAGNOSIS — Z950 Presence of cardiac pacemaker: Secondary | ICD-10-CM

## 2013-08-24 DIAGNOSIS — I4891 Unspecified atrial fibrillation: Secondary | ICD-10-CM

## 2013-08-28 ENCOUNTER — Ambulatory Visit (INDEPENDENT_AMBULATORY_CARE_PROVIDER_SITE_OTHER): Payer: Medicare HMO | Admitting: *Deleted

## 2013-08-28 ENCOUNTER — Other Ambulatory Visit: Payer: Self-pay | Admitting: *Deleted

## 2013-08-28 ENCOUNTER — Encounter (INDEPENDENT_AMBULATORY_CARE_PROVIDER_SITE_OTHER): Payer: Medicare HMO | Admitting: Cardiology

## 2013-08-28 DIAGNOSIS — Z7901 Long term (current) use of anticoagulants: Secondary | ICD-10-CM

## 2013-08-28 LAB — POCT INR: INR: 1.6

## 2013-08-28 MED ORDER — DILTIAZEM HCL ER COATED BEADS 180 MG PO CP24: 180.0000 mg | ORAL_CAPSULE | Freq: Every evening | ORAL | Status: AC

## 2013-08-28 NOTE — Telephone Encounter (Signed)
Diltiazem refilled for 90 days supply w/2 refills.

## 2013-08-28 NOTE — Patient Instructions (Signed)
Your physician recommends that you schedule a follow-up appointment in: 3 months , continue your same medications

## 2013-09-12 NOTE — Progress Notes (Signed)
This encounter was created in error - please disregard.  This encounter was created in error - please disregard.

## 2013-09-20 ENCOUNTER — Ambulatory Visit (INDEPENDENT_AMBULATORY_CARE_PROVIDER_SITE_OTHER): Payer: Medicare HMO | Admitting: Pharmacist Clinician (PhC)/ Clinical Pharmacy Specialist

## 2013-09-20 VITALS — HR 88

## 2013-09-20 DIAGNOSIS — Z7901 Long term (current) use of anticoagulants: Secondary | ICD-10-CM

## 2013-09-20 LAB — POCT INR: INR: 3

## 2013-09-22 LAB — MDC_IDC_ENUM_SESS_TYPE_REMOTE
Battery Impedance: 134 Ohm
Battery Remaining Longevity: 135 mo
Battery Voltage: 2.79 V
Brady Statistic AP VP Percent: 100 %
Brady Statistic AP VS Percent: 0 %
Brady Statistic AS VP Percent: 0 %
Date Time Interrogation Session: 20150305191704
Lead Channel Impedance Value: 503 Ohm
Lead Channel Pacing Threshold Amplitude: 0.625 V
Lead Channel Setting Pacing Amplitude: 1.5 V
Lead Channel Setting Pacing Amplitude: 2 V
Lead Channel Setting Pacing Pulse Width: 0.4 ms
Lead Channel Setting Sensing Sensitivity: 2.8 mV
MDC IDC MSMT LEADCHNL RA PACING THRESHOLD PULSEWIDTH: 0.4 ms
MDC IDC MSMT LEADCHNL RV IMPEDANCE VALUE: 519 Ohm
MDC IDC MSMT LEADCHNL RV PACING THRESHOLD AMPLITUDE: 0.875 V
MDC IDC MSMT LEADCHNL RV PACING THRESHOLD PULSEWIDTH: 0.4 ms
MDC IDC STAT BRADY AS VS PERCENT: 0 %

## 2013-10-11 ENCOUNTER — Ambulatory Visit (INDEPENDENT_AMBULATORY_CARE_PROVIDER_SITE_OTHER): Payer: Medicare HMO | Admitting: Pharmacist Clinician (PhC)/ Clinical Pharmacy Specialist

## 2013-10-11 VITALS — BP 122/60 | HR 80

## 2013-10-11 DIAGNOSIS — Z7901 Long term (current) use of anticoagulants: Secondary | ICD-10-CM

## 2013-10-11 LAB — POCT INR: INR: 2.4

## 2013-10-11 MED ORDER — WARFARIN SODIUM 2.5 MG PO TABS: ORAL_TABLET | ORAL | Status: DC

## 2013-10-13 ENCOUNTER — Encounter: Payer: Self-pay | Admitting: *Deleted

## 2013-10-21 ENCOUNTER — Ambulatory Visit (INDEPENDENT_AMBULATORY_CARE_PROVIDER_SITE_OTHER): Payer: Medicare HMO | Admitting: Internal Medicine

## 2013-10-21 VITALS — BP 130/60 | HR 68 | Temp 97.3°F | Resp 16

## 2013-10-21 DIAGNOSIS — J309 Allergic rhinitis, unspecified: Secondary | ICD-10-CM

## 2013-10-21 DIAGNOSIS — J301 Allergic rhinitis due to pollen: Secondary | ICD-10-CM

## 2013-10-21 DIAGNOSIS — J019 Acute sinusitis, unspecified: Secondary | ICD-10-CM

## 2013-10-21 DIAGNOSIS — E039 Hypothyroidism, unspecified: Secondary | ICD-10-CM

## 2013-10-21 DIAGNOSIS — H6120 Impacted cerumen, unspecified ear: Secondary | ICD-10-CM

## 2013-10-21 MED ORDER — TRIAMCINOLONE ACETONIDE 55 MCG/ACT NA AERO: INHALATION_SPRAY | NASAL | Status: AC

## 2013-10-21 MED ORDER — LEVOTHYROXINE SODIUM 100 MCG PO TABS: 100.0000 ug | ORAL_TABLET | Freq: Every day | ORAL | Status: DC

## 2013-10-21 MED ORDER — AMOXICILLIN 875 MG PO TABS: 875.0000 mg | ORAL_TABLET | Freq: Two times a day (BID) | ORAL | Status: DC

## 2013-10-23 NOTE — Progress Notes (Signed)
   Subjective:    Patient ID: Carla Black, female    DOB: 08/07/1927, 80 y.o.   MRN: [id number]  HPIf/u hypothyr-needs ref Also c/o fullness R ear 1 month--decr hearing No pain,fever,uri but has had AR sxtoms 3-4 weeks getting worse til last 4-5 days having yellow d/c plus sinus pressure  cardiopulm stable recently on meds/O2  Review of Systems noncontr    Objective:   Physical Exam BP 130/60  Pulse 68  Temp(Src) 97.3 F (36.3 C) (Oral)  Resp 16  SpO2 87% HEENT clear x boogy turbs w/ purulent d/c, tender max areas to perc, and wax impaction R ear  Procedure:rear irrig after colace successful Tm and canal normal after       Lab Results  Component Value Date   TSH 0.826 12/21/2012  has been stable at this dose  Assessment & Plan:  Unspecified hypothyroidism - Plan: levothyroxine (SYNTHROID, LEVOTHROID) 100 MCG tablet  Allergic rhinitis causing Sinusitis--otc meds//Amox//add nasocort  Meds ordered this encounter  Medications  . levothyroxine (SYNTHROID, LEVOTHROID) 100 MCG tablet    Sig: Take 1 tablet (100 mcg total) by mouth daily before breakfast.    Dispense:  90 tablet    Refill:  1  . triamcinolone (NASACORT AQ) 55 MCG/ACT AERO nasal inhaler    Sig: 1 spray each nostril twice a day    Dispense:  1 Inhaler    Refill:  12    3 month supply OK  . amoxicillin (AMOXIL) 875 MG tablet    Sig: Take 1 tablet (875 mg total) by mouth 2 (two) times daily.    Dispense:  20 tablet    Refill:  0

## 2013-11-10 ENCOUNTER — Ambulatory Visit (INDEPENDENT_AMBULATORY_CARE_PROVIDER_SITE_OTHER): Payer: Medicare HMO | Admitting: Pharmacist Clinician (PhC)/ Clinical Pharmacy Specialist

## 2013-11-10 DIAGNOSIS — Z7901 Long term (current) use of anticoagulants: Secondary | ICD-10-CM

## 2013-11-10 LAB — POCT INR: INR: 2.1

## 2013-12-15 ENCOUNTER — Ambulatory Visit (INDEPENDENT_AMBULATORY_CARE_PROVIDER_SITE_OTHER): Payer: Medicare HMO | Admitting: Pharmacist Clinician (PhC)/ Clinical Pharmacy Specialist

## 2013-12-15 DIAGNOSIS — Z7901 Long term (current) use of anticoagulants: Secondary | ICD-10-CM

## 2013-12-15 LAB — POCT INR: INR: 2.2

## 2014-01-02 ENCOUNTER — Encounter: Payer: Self-pay | Admitting: Cardiovascular Disease

## 2014-01-02 ENCOUNTER — Ambulatory Visit (INDEPENDENT_AMBULATORY_CARE_PROVIDER_SITE_OTHER): Payer: Medicare HMO | Admitting: Cardiovascular Disease

## 2014-01-02 VITALS — BP 152/66 | HR 66 | Resp 16 | Ht 63.5 in | Wt 195.2 lb

## 2014-01-02 DIAGNOSIS — N183 Chronic kidney disease, stage 3 unspecified: Secondary | ICD-10-CM

## 2014-01-02 DIAGNOSIS — R5381 Other malaise: Secondary | ICD-10-CM

## 2014-01-02 DIAGNOSIS — I059 Rheumatic mitral valve disease, unspecified: Secondary | ICD-10-CM

## 2014-01-02 DIAGNOSIS — I5033 Acute on chronic diastolic (congestive) heart failure: Secondary | ICD-10-CM

## 2014-01-02 DIAGNOSIS — I442 Atrioventricular block, complete: Secondary | ICD-10-CM

## 2014-01-02 DIAGNOSIS — R001 Bradycardia, unspecified: Secondary | ICD-10-CM

## 2014-01-02 DIAGNOSIS — Z79899 Other long term (current) drug therapy: Secondary | ICD-10-CM

## 2014-01-02 DIAGNOSIS — R5383 Other fatigue: Secondary | ICD-10-CM

## 2014-01-02 DIAGNOSIS — I421 Obstructive hypertrophic cardiomyopathy: Secondary | ICD-10-CM

## 2014-01-02 DIAGNOSIS — I340 Nonrheumatic mitral (valve) insufficiency: Secondary | ICD-10-CM

## 2014-01-02 DIAGNOSIS — I480 Paroxysmal atrial fibrillation: Secondary | ICD-10-CM

## 2014-01-02 DIAGNOSIS — I498 Other specified cardiac arrhythmias: Secondary | ICD-10-CM

## 2014-01-02 DIAGNOSIS — I4891 Unspecified atrial fibrillation: Secondary | ICD-10-CM

## 2014-01-02 DIAGNOSIS — Z950 Presence of cardiac pacemaker: Secondary | ICD-10-CM

## 2014-01-02 LAB — MDC_IDC_ENUM_SESS_TYPE_INCLINIC
Battery Remaining Longevity: 135 mo
Battery Voltage: 2.79 V
Brady Statistic AP VS Percent: 0 %
Brady Statistic AS VS Percent: 0 %
Date Time Interrogation Session: 20150714145347
Lead Channel Impedance Value: 482 Ohm
Lead Channel Impedance Value: 521 Ohm
Lead Channel Pacing Threshold Pulse Width: 0.4 ms
Lead Channel Sensing Intrinsic Amplitude: 2.8 mV
Lead Channel Setting Pacing Amplitude: 1.5 V
Lead Channel Setting Pacing Amplitude: 2 V
MDC IDC MSMT BATTERY IMPEDANCE: 134 Ohm
MDC IDC MSMT LEADCHNL RA PACING THRESHOLD AMPLITUDE: 0.5 V
MDC IDC MSMT LEADCHNL RV PACING THRESHOLD AMPLITUDE: 0.75 V
MDC IDC MSMT LEADCHNL RV PACING THRESHOLD PULSEWIDTH: 0.4 ms
MDC IDC SET LEADCHNL RV PACING PULSEWIDTH: 0.4 ms
MDC IDC SET LEADCHNL RV SENSING SENSITIVITY: 2.8 mV
MDC IDC STAT BRADY AP VP PERCENT: 100 %
MDC IDC STAT BRADY AS VP PERCENT: 0 %

## 2014-01-02 LAB — PACEMAKER DEVICE OBSERVATION

## 2014-01-02 MED ORDER — METOPROLOL TARTRATE 100 MG PO TABS: 150.0000 mg | ORAL_TABLET | Freq: Two times a day (BID) | ORAL | Status: AC

## 2014-01-02 MED ORDER — FUROSEMIDE 40 MG PO TABS: 40.0000 mg | ORAL_TABLET | Freq: Every day | ORAL | Status: DC

## 2014-01-02 NOTE — Patient Instructions (Addendum)
Dr Sallyanne Kuster has recommended making the following medication changes:  FUROSEMIDE - take 1 tablet (40 mg total) by mouth daily  Your physician has ordered you to have to blood work done in 10 days. (On or around Friday, July 24th)  Remote monitoring is used to monitor your pacemaker from home. This monitoring reduces the number of office visits required to check your device to one time per year. It allows [place] to keep an eye on the functioning of your device to ensure it is working properly. You are scheduled for a device check from home on 04-05-2014. You may send your transmission at any time that day. If you have a wireless device, the transmission will be sent automatically. After your physician reviews your transmission, you will receive a postcard with your next transmission date.  Your physician recommends that you schedule a follow-up appointment in 6 months with Dr Sallyanne Kuster.

## 2014-01-03 ENCOUNTER — Encounter: Payer: Self-pay | Admitting: Cardiovascular Disease

## 2014-01-03 ENCOUNTER — Ambulatory Visit (INDEPENDENT_AMBULATORY_CARE_PROVIDER_SITE_OTHER): Payer: Medicare HMO | Admitting: Pharmacist

## 2014-01-03 DIAGNOSIS — N183 Chronic kidney disease, stage 3 unspecified: Secondary | ICD-10-CM | POA: Insufficient documentation

## 2014-01-03 DIAGNOSIS — I442 Atrioventricular block, complete: Secondary | ICD-10-CM | POA: Insufficient documentation

## 2014-01-03 DIAGNOSIS — Z7901 Long term (current) use of anticoagulants: Secondary | ICD-10-CM

## 2014-01-03 LAB — POCT INR: INR: 2

## 2014-01-03 NOTE — Assessment & Plan Note (Signed)
Pacemaker dependent today.

## 2014-01-03 NOTE — Assessment & Plan Note (Signed)
The murmur of obstruction is barely audible today. Continue high dose beta blocker and committed V pacing.

## 2014-01-03 NOTE — Assessment & Plan Note (Signed)
Renal function appears to be at baseline. Recheck labs after 10 days on increased diuretic dose.

## 2014-01-03 NOTE — Assessment & Plan Note (Signed)
This is likely a major contributor to her CHF. Her age, obesity, history of prior sternotomy and numerous comorbid conditions make her a poor surgical candidate.

## 2014-01-03 NOTE — Progress Notes (Signed)
Patient ID: Almond Lint, female   DOB: June 10, 1928, 80 y.o.   MRN: [id number]      Reason for office visit Pacemaker f/u, atrial fibrillation, HOCM, severe MR, diastolic CHF, CAD  Mrs. Juniel is generally doing similar to her last appointment, but has slightly worse exertional dyspnea and slightly increased calf edema compared to last month. Her weight is well above the target dry weight of 190 lb (which is likely a conservative estimate). She is wary of diuretics since she also has CKD. Pacemaker function is normal. She has developed complete heart block.  Mrs. Hedding has a very complex cardiac pathophysiology.  She has hypertrophic obstructive cardiomyopathy and at times has had severe LV outflow tract gradients.  She also has severe mitral insufficiency. This was felt to be possibly secondary to systolic anterior motion of the mitral valve, but by transesophageal echocardiography she was found to have ruptured chordae tendinae and a flail leaflet.  In 2012, she had a protracted hospitalization secondary to atrial fibrillation with rapid ventricular response that led to congestive heart failure. Eventually, we achieved compensation with amiodarone therapy but this led to symptomatic bradycardia. She subsequently received a dual-chamber permanent pacemaker in January 2014 (Medtronic). It is programmed with a short AV delay to promote V pacing for HOCM. She appears to now have developed complete heart block on the current meds and should be considered pacemaker dependent. She does have a history of CVA in 2011 and takes warfarin. Also has coronary disease with stents in the first diagonal artery and left circumflex artery, most recently in 2009. In 2010 she had cutting balloon angioplasty to the proximal LCX stent and to the nonstented ostium of the OM2 and to the ostium of the first diagonal, without new stent placement. Cardiac cath in September 2012 showed subtotally occluded nondominant RCA,  several 40-60% stenoses in LCX (not significant by FFR), 40% second diagonal artery stenosis. She has had a previous sternotomy for thymectomy in 1991.   Allergies  Allergen Reactions  . Metoprolol Other (See Comments)    Bradycardia  . Pseudoephedrine Other (See Comments)    Insomnia (took 1 tablet and could not sleep for 3 days)  . Statins Other (See Comments)    myalgia  . Codeine Nausea Only and Other (See Comments)    dizziness  . Seldane [Terfenadine] Nausea Only and Other (See Comments)    dizziness    Current Outpatient Prescriptions  Medication Sig Dispense Refill  . acetaminophen (TYLENOL) 325 MG tablet Take 1-2 tablets (325-650 mg total) by mouth every 4 (four) hours as needed.      Marland Kitchen aspirin 81 MG tablet Take 81 mg by mouth daily.       Marland Kitchen diltiazem (CARDIZEM CD) 180 MG 24 hr capsule Take 1 capsule (180 mg total) by mouth every evening.  90 capsule  2  . fexofenadine (ALLEGRA) 180 MG tablet Take 180 mg by mouth daily as needed for allergies or rhinitis.      . furosemide (LASIX) 40 MG tablet Take 1 tablet (40 mg total) by mouth daily. Take one tablet daily until weight is below 190, then one tablet 3 times a week (Mon/Wed/Fri.  90 tablet  3  . guaiFENesin (MUCINEX) 600 MG 12 hr tablet Take 1,200 mg by mouth 2 (two) times daily.      Marland Kitchen levalbuterol (XOPENEX) 0.31 MG/3ML nebulizer solution Take 1 ampule by nebulization 3 (three) times daily.      Marland Kitchen levothyroxine (SYNTHROID, LEVOTHROID) 100  MCG tablet Take 1 tablet (100 mcg total) by mouth daily before breakfast.  90 tablet  1  . meclizine (ANTIVERT) 25 MG tablet Take 25 mg by mouth as needed.      . metoprolol (LOPRESSOR) 100 MG tablet Take 1.5 tablets (150 mg total) by mouth 2 (two) times daily.  270 tablet  3  . mometasone (NASONEX) 50 MCG/ACT nasal spray Place 2 sprays into the nose 2 (two) times daily.      . Multiple Vitamins-Minerals (ICAPS AREDS FORMULA PO) Take 1 capsule by mouth 2 (two) times daily. PER PATIENT THIS IS  ICAPS AREDS #2      . Omega 3-6-9 Fatty Acids (OMEGA 3-6-9 COMPLEX PO) Take 1 capsule by mouth 2 (two) times daily.       Marland Kitchen PACERONE 200 MG tablet TAKE ONE TABLET BY MOUTH DAILY.  90 tablet  3  . potassium chloride SA (K-DUR,KLOR-CON) 20 MEQ tablet Take 0.5 tablets (10 mEq total) by mouth daily. When taking furosemide  90 tablet  3  . PROAIR HFA 108 (90 BASE) MCG/ACT inhaler INHALE TWO PUFFS BY MOUTH EVERY 6 HOURS AS NEEDED  9 each  0  . Probiotic Product (ALIGN PO) Take 1 capsule by mouth every morning.       . quinapril (ACCUPRIL) 20 MG tablet TAKE ONE TABLET BY MOUTH ONCE DAILY  90 tablet  3  . triamcinolone (NASACORT AQ) 55 MCG/ACT AERO nasal inhaler 1 spray each nostril twice a day  1 Inhaler  12  . warfarin (COUMADIN) 2.5 MG tablet Take 1-1.5 tablets by mouth daily as directed by coumadin clinic  135 tablet  2   No current facility-administered medications for this visit.    Past Medical History  Diagnosis Date  . Hypertension   . Hyperlipidemia   . Heart murmur   . GERD (gastroesophageal reflux disease)   . Joint pain     left knee  . Dizziness   . Chest tightness 02/09/2008  . Hiatal hernia   . CHF (congestive heart failure)   . Angina   . Coronary artery disease   . Hypertrophic cardiomyopathy     subvalvular gradient 45-mm  . Dysrhythmia     hx of atrial fibrilation  . On home oxygen therapy     "2L at rest; 3L w/activity" (06/24/2012)  . Pacemaker 06/24/2012  . Melanoma of back 1961  . Complication of anesthesia   . Claustrophobia     "severe" (06/24/2012)  . Carotid artery occlusion   . Pneumonia     "lastest was 04/26/2011; multiple times before that" (06/24/2012)  . Chronic bronchitis     "used to get it often" (06/24/2012)  . Shortness of breath     "all the time" (06/24/2012)  . Hypothyroidism   . Stroke 2012    hx of TIA  . Arthritis     "2 fingers" (06/24/2012)  . PAF (paroxysmal atrial fibrillation) 06/24/2012  . Symptomatic bradycardia 06/24/2012  . S/P cardiac  pacemaker procedure, insertion Medtronic device 06/24/12 06/24/2012  . Carotid artery disease 09/21/2011    40-59% R ICA stenosis; patent L carotid endarterectomy w/minimal restenosis; bilateral vertebral arteries are antegrade  . Cataract     Past Surgical History  Procedure Laterality Date  . 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 Microlaryngoscopy with co2 laser and excision of vocal cord lesion  1983  . Carotid endarterectomy  1990's    "  left" (06/24/2012)  . Cardiac catheterization  03/14/2011    Stent CX 40% with nml ffr  . Tee without cardioversion  05/01/2011    Procedure: TRANSESOPHAGEAL ECHOCARDIOGRAM (TEE);  Surgeon: Sanda Klein;  Location: [facility];  Service: Cardiovascular;  Laterality: N/A;  . Cardioversion  05/01/2011    Procedure: CARDIOVERSION;  Surgeon: Dani Gobble Anora Schwenke;  Location: [facility];  Service: Cardiovascular;  Laterality: N/A;  . Cardioversion  06/30/2011    Procedure: CARDIOVERSION;  Surgeon: Sanda Klein, MD;  Location: [facility];  Service: Cardiovascular;  Laterality: N/A;  . Insert / replace / remove pacemaker  06/24/2012    dual chamber (06/24/2012)  . Tonsillectomy  1940's?  . Appendectomy  1940's  . Abdominal hysterectomy  1970's  . Dilation and curettage of uterus  1960's - 1970's    "alot" (06/24/2012)  . Cataract extraction w/ intraocular lens  implant, bilateral  2000's  . Melanoma excision  1961    "off my back" (06/24/2012)  . Microlaryngoscopy with co2 laser and excision of vocal cord lesion    . Transthoracic echocardiogram  05/17/2012    see Procedures tab  . Cardiovascular stress test  09/13/2007    R/P MV - EF 56%; mild ischemia in basal and mid inferolateral regions; global LV systolic function normal; EKG negative for ischemia; pt experienced chest pain during stress test, resolved spontaneously  . Eye surgery      Family History  Problem Relation Age of Onset  . Heart disease Mother   . Stroke  Mother   . Other Father     aneurysm  . Coronary artery disease Other   . Stroke Other     History   Social History  . Marital Status: Married    Spouse Name: N/A    Number of Children: N/A  . Years of Education: N/A   Occupational History  . Not on file.   Social History Main Topics  . Smoking status: Former Smoker -- 1.00 packs/day for 35 years    Types: Cigarettes    Quit date: 06/22/1989  . Smokeless tobacco: Never Used  . Alcohol Use: 0.0 oz/week     Comment: 06/24/2012 "suppose to drink 6oz wine qd; haven't had a glass in > 1 month"  . Drug Use: No  . Sexual Activity: No   Other Topics Concern  . Not on file   Social History Narrative  . No narrative on file    Review of systems: Chronic shortness of breath at rest and with minimal activity, worsening edema, no angina.  The patient specifically denies any chest pain at rest or with exertion, orthopnea, paroxysmal nocturnal dyspnea, syncope, palpitations, focal neurological deficits, intermittent claudication, unexplained weight gain, cough, hemoptysis or wheezing.  The patient is abdominal fullness and anorexia but denies abdominal pain, nausea, vomiting, dysphagia, diarrhea, constipation, polyuria, polydipsia, dysuria, hematuria, frequency, urgency, abnormal bleeding or bruising, fever, chills, unexpected weight changes, mood swings, change in skin or hair texture, change in voice quality, auditory or visual problems, allergic reactions or rashes, new musculoskeletal complaints other than usual "aches and pains".   PHYSICAL EXAM BP 152/66  Pulse 66  Resp 16  Ht 5' 3.5" (1.613 m)  Wt 195 lb 3.2 oz (88.542 kg)  BMI 34.03 kg/m2 General: Alert, oriented x3, no distress, moderately obese  Head: no evidence of trauma, PERRL, EOMI, no exophtalmos or lid lag, no myxedema, no xanthelasma; normal ears, nose and oropharynx  Neck: 8-10 cm elevation in jugular venous pulsations and prompt  hepatojugular reflux; brisk carotid  pulses without delay and no carotid bruits  Chest: clear to auscultation, no signs of consolidation by percussion or palpation, normal fremitus, symmetrical and full respiratory excursions; healthy left subclavian pacemaker site  Cardiovascular: normal position and quality of the apical impulse, regular rhythm, normal first and paradoxically split second heart sounds, no rubs or gallops, grade 1/6 systolic ejection murmur is heard throughout the anterior precordium from the aortic focus all the way to the apex and worsens with a Valsalva maneuver. Hard to separate the holosystolic murmur of MR from the ejection murmur.  Abdomen: no tenderness or distention, no masses by palpation, no abnormal pulsatility or arterial bruits, normal bowel sounds, no hepatosplenomegaly  Extremities: no clubbing, cyanosis ; 3+ bilateral calf edema; 2+ radial, ulnar and brachial pulses bilaterally; 2+ right femoral, posterior tibial and dorsalis pedis pulses; 2+ left femoral, posterior tibial and dorsalis pedis pulses; no subclavian or femoral bruits  Neurological: grossly nonfocal   EKG: 100% AV sequential pacemaker  Lipid Panel     Component Value Date/Time   CHOL 209* 11/25/2011 0530   TRIG 91 11/25/2011 0530   HDL 54 11/25/2011 0530   CHOLHDL 3.9 11/25/2011 0530   VLDL 18 11/25/2011 0530   LDLCALC 137* 11/25/2011 0530    BMET    Component Value Date/Time   NA 140 07/27/2013 0856   K 4.4 07/27/2013 0856   CL 96 07/27/2013 0856   CO2 37* 07/27/2013 0856   GLUCOSE 94 07/27/2013 0856   BUN 27* 07/27/2013 0856   CREATININE 1.36* 07/27/2013 0856   CREATININE 1.28* 11/27/2011 0630   CALCIUM 9.0 07/27/2013 0856   GFRNONAA 38* 11/27/2011 0630   GFRAA 44* 11/27/2011 0630     ASSESSMENT AND PLAN Acute on chronic diastolic heart failure Worsening symptoms of CHF and signs of hypervolemia. Increase daily diuretic dose. Avoid excessive diuresis to prevent worsening of CKD and LV outflow obstruction.  Hypertrophic obstructive  cardiomyopathy(425.11) The murmur of obstruction is barely audible today. Continue high dose beta blocker and committed V pacing.  PAF (paroxysmal atrial fibrillation) None has occurred since her last pacemaker check. Continue warfarin (+ve history of CVA) and amiodarone. Periodic reevaluation of LFTs and TFTs.  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 Her age, obesity, history of prior sternotomy and numerous comorbid conditions make her a poor surgical candidate.  S/P cardiac pacemaker procedure, insertion Medtronic device 06/24/12 Normal device function. 100% AV paced. No changes were made today.  Complete heart block Pacemaker dependent today.  CKD (chronic kidney disease) stage 3, GFR 30-59 ml/min Renal function appears to be at baseline. Recheck labs after 10 days on increased diuretic dose.   Patient Instructions  Dr Sallyanne Kuster has recommended making the following medication changes:  FUROSEMIDE - take 1 tablet (40 mg total) by mouth daily  Your physician has ordered you to have to blood work done in 10 days. (On or around Friday, July 24th)  Remote monitoring is used to monitor your pacemaker from home. This monitoring reduces the number of office visits required to check your device to one time per year. It allows [place] to keep an eye on the functioning of your device to ensure it is working properly. You are scheduled for a device check from home on 04-05-2014. You may send your transmission at any time that day. If you have a wireless device, the transmission will be sent  automatically. After your physician reviews your transmission, you will receive a postcard with your next transmission date.  Your physician recommends that you schedule a follow-up appointment in 6 months with Dr Sallyanne Kuster.    Orders Placed This Encounter  Procedures  . Comprehensive metabolic panel  . TSH  .  Implantable device check  . EKG 12-Lead   Meds ordered this encounter  Medications  . furosemide (LASIX) 40 MG tablet    Sig: Take 1 tablet (40 mg total) by mouth daily. Take one tablet daily until weight is below 190, then one tablet 3 times a week (Mon/Wed/Fri.    Dispense:  90 tablet    Refill:  3  . metoprolol (LOPRESSOR) 100 MG tablet    Sig: Take 1.5 tablets (150 mg total) by mouth 2 (two) times daily.    Dispense:  270 tablet    Refill:  Covina Shaun Zuccaro, MD, [facility] [phone] office [phone] pager

## 2014-01-03 NOTE — Assessment & Plan Note (Signed)
None has occurred since her last pacemaker check. Continue warfarin (+ve history of CVA) and amiodarone. Periodic reevaluation of LFTs and TFTs.

## 2014-01-03 NOTE — Assessment & Plan Note (Signed)
Normal device function. 100% AV paced. No changes were made today.

## 2014-01-03 NOTE — Assessment & Plan Note (Signed)
Worsening symptoms of CHF and signs of hypervolemia. Increase daily diuretic dose. Avoid excessive diuresis to prevent worsening of CKD and LV outflow obstruction.

## 2014-01-09 ENCOUNTER — Telehealth: Payer: Self-pay | Admitting: Cardiovascular Disease

## 2014-01-09 NOTE — Telephone Encounter (Signed)
Pt called and said she have not been able to double up on her Lasix.  She have had an accident and is unable to get to the bathroom. Please call her and advise what to do about the Lasix.

## 2014-01-09 NOTE — Telephone Encounter (Signed)
Returned call to patient no answer.Unable to leave a message no answering machine.

## 2014-01-10 NOTE — Telephone Encounter (Signed)
Took 40mg  of Lasix last Thursday and sat on the toilet so long her left foot and leg became numb.  When she got up to walk it off she lost her balance and fell in the bathroom bruising her ribs.  Now she can't hurry to go when she has the urge to urinate so she has not increase the Lasix to 40mg  daily.  Wants to know what to do.  Suggested she get adult diapers (underpants) but she is not too keen on the idea.  Also suggested she get up hourly even if she doesn't have the urge to urinate. Told her not to wait for the urge to go to the bathroom since she can't hurry and empty her bladder hourly or so. Informed her swelling will not go down until she increases the Lasix.  She will get her labs done when she starts the Lasix 40mg  qd.  Patient voiced understanding and will try the above techniques.

## 2014-01-16 ENCOUNTER — Encounter: Payer: Self-pay | Admitting: Cardiovascular Disease

## 2014-01-26 LAB — TSH: TSH: 1.266 u[IU]/mL (ref 0.350–4.500)

## 2014-01-26 LAB — COMPREHENSIVE METABOLIC PANEL
ALK PHOS: 82 U/L (ref 39–117)
ALT: 30 U/L (ref 0–35)
AST: 39 U/L — AB (ref 0–37)
Albumin: 3.7 g/dL (ref 3.5–5.2)
BILIRUBIN TOTAL: 0.6 mg/dL (ref 0.2–1.2)
BUN: 32 mg/dL — ABNORMAL HIGH (ref 6–23)
CO2: 35 mEq/L — ABNORMAL HIGH (ref 19–32)
Calcium: 9 mg/dL (ref 8.4–10.5)
Chloride: 98 mEq/L (ref 96–112)
Creat: 1.5 mg/dL — ABNORMAL HIGH (ref 0.50–1.10)
Glucose, Bld: 93 mg/dL (ref 70–99)
Potassium: 4.9 mEq/L (ref 3.5–5.3)
SODIUM: 142 meq/L (ref 135–145)
TOTAL PROTEIN: 7.1 g/dL (ref 6.0–8.3)

## 2014-01-29 ENCOUNTER — Telehealth: Payer: Self-pay | Admitting: *Deleted

## 2014-01-29 DIAGNOSIS — Z79899 Other long term (current) drug therapy: Secondary | ICD-10-CM

## 2014-01-29 NOTE — Telephone Encounter (Signed)
Message copied by Tressa Busman on Mon Jan 29, 2014  8:46 AM ------      Message from: Sanda Klein      Created: Sun Jan 28, 2014  7:26 PM       Labs OK, largely unchanged from before. Very slight bump in creatinine with extra diuretic is not worrisome. Repeat CMET in 3 months please ------

## 2014-01-29 NOTE — Telephone Encounter (Signed)
Lab results called to patient.   Weight this AM 185 lbs.  Repeated instructions for Lasix to take daily if weight is 190 to greater and 3 times a week if weight is less than 190.  Will have labs rechecked in 3 months. Patient voiced understanding.

## 2014-02-13 ENCOUNTER — Ambulatory Visit: Payer: Medicare HMO | Admitting: Pharmacist Clinician (PhC)/ Clinical Pharmacy Specialist

## 2014-02-14 ENCOUNTER — Encounter: Payer: Self-pay | Admitting: Physician Assistant

## 2014-02-14 ENCOUNTER — Ambulatory Visit (INDEPENDENT_AMBULATORY_CARE_PROVIDER_SITE_OTHER): Payer: Medicare HMO | Admitting: Pharmacist Clinician (PhC)/ Clinical Pharmacy Specialist

## 2014-02-14 ENCOUNTER — Ambulatory Visit (INDEPENDENT_AMBULATORY_CARE_PROVIDER_SITE_OTHER): Payer: Medicare HMO | Admitting: Physician Assistant

## 2014-02-14 ENCOUNTER — Other Ambulatory Visit: Payer: Self-pay | Admitting: *Deleted

## 2014-02-14 VITALS — BP 174/86 | HR 60 | Ht 63.0 in | Wt 183.0 lb

## 2014-02-14 DIAGNOSIS — M549 Dorsalgia, unspecified: Secondary | ICD-10-CM | POA: Insufficient documentation

## 2014-02-14 DIAGNOSIS — R079 Chest pain, unspecified: Secondary | ICD-10-CM

## 2014-02-14 DIAGNOSIS — M546 Pain in thoracic spine: Secondary | ICD-10-CM

## 2014-02-14 DIAGNOSIS — I10 Essential (primary) hypertension: Secondary | ICD-10-CM

## 2014-02-14 DIAGNOSIS — Z7901 Long term (current) use of anticoagulants: Secondary | ICD-10-CM

## 2014-02-14 LAB — POCT INR: INR: 3.3

## 2014-02-14 NOTE — Patient Instructions (Signed)
1.  Follow up with Dr. Loletha Grayer as scheduled.

## 2014-02-14 NOTE — Assessment & Plan Note (Signed)
Patient is being seen in the Coumadin clinic and stated she developed severe back pain last night.  During exam the patient's pain is reproducible. She also said that she had gone to the chiropractor earlier today and after that visit her pain was considerably better. She is noncardiac in nature. It has improved. She'll followup as scheduled with Dr. Loletha Grayer.

## 2014-02-14 NOTE — Progress Notes (Signed)
Date:  02/14/2014   ID:  Almond Lint, DOB 1927/07/10, MRN [id number]  PCP:  Leandrew Koyanagi, MD  Primary Cardiologist:  Croitoru     History of Present Illness: Carla Black is a 80 y.o. female with hypertrophic obstructive cardiomyopathy and at times has had severe LV outflow tract gradients.  She also has severe mitral insufficiency. This was felt to be possibly secondary to systolic anterior motion of the mitral valve, but by transesophageal echocardiography she was found to have ruptured chordae tendinae and a flail leaflet.   In 2012, she had a protracted hospitalization secondary to atrial fibrillation with rapid ventricular response that led to congestive heart failure. Eventually, we achieved compensation with amiodarone therapy but this led to symptomatic bradycardia.   She subsequently received a dual-chamber permanent pacemaker in January 2014 (Medtronic). It is programmed with a short AV delay to promote V pacing for HOCM. She appears to now have developed complete heart block on the current meds and should be considered pacemaker dependent.  She does have a history of CVA in 2011 and takes warfarin.   Also has coronary disease with stents in the first diagonal artery and left circumflex artery, most recently in 2009. In 2010 she had cutting balloon angioplasty to the proximal LCX stent and to the nonstented ostium of the OM2 and to the ostium of the first diagonal, without new stent placement. Cardiac cath in September 2012 showed subtotally occluded nondominant RCA, several 40-60% stenoses in LCX (not significant by FFR), 40% second diagonal artery stenosis. She has had a previous sternotomy for thymectomy in 1991.   The patient is being seen in Coumadin clinic admission she developed severe back pain last night. It was 10 out of 10, sharp stabbing. It was worse with left arm movement. She denies nausea, vomiting, shortness of breath, diaphoresis, worsening lower  extremity edema.   She says she went to the chiropractor this morning pain is considerably better.   The patient currently denies nausea, vomiting, fever, chest pain, shortness of breath, orthopnea, dizziness, PND, cough, congestion, abdominal pain, hematochezia, melena  Wt Readings from Last 3 Encounters:  02/14/14 183 lb (83.008 kg)  01/02/14 195 lb 3.2 oz (88.542 kg)  08/28/13 195 lb (88.451 kg)     Past Medical History  Diagnosis Date  . Hypertension   . Hyperlipidemia   . Heart murmur   . GERD (gastroesophageal reflux disease)   . Joint pain     left knee  . Dizziness   . Chest tightness 02/09/2008  . Hiatal hernia   . CHF (congestive heart failure)   . Angina   . Coronary artery disease   . Hypertrophic cardiomyopathy     subvalvular gradient 45-mm  . Dysrhythmia     hx of atrial fibrilation  . On home oxygen therapy     "2L at rest; 3L w/activity" (06/24/2012)  . Pacemaker 06/24/2012  . Melanoma of back 1961  . Complication of anesthesia   . Claustrophobia     "severe" (06/24/2012)  . Carotid artery occlusion   . Pneumonia     "lastest was 04/26/2011; multiple times before that" (06/24/2012)  . Chronic bronchitis     "used to get it often" (06/24/2012)  . Shortness of breath     "all the time" (06/24/2012)  . Hypothyroidism   . Stroke 2012    hx of TIA  . Arthritis     "2 fingers" (06/24/2012)  . PAF (paroxysmal atrial  fibrillation) 06/24/2012  . Symptomatic bradycardia 06/24/2012  . S/P cardiac pacemaker procedure, insertion Medtronic device 06/24/12 06/24/2012  . Carotid artery disease 09/21/2011    40-59% R ICA stenosis; patent L carotid endarterectomy w/minimal restenosis; bilateral vertebral arteries are antegrade  . Cataract     Current Outpatient Prescriptions  Medication Sig Dispense Refill  . acetaminophen (TYLENOL) 325 MG tablet Take 1-2 tablets (325-650 mg total) by mouth every 4 (four) hours as needed.      Marland Kitchen aspirin 81 MG tablet Take 81 mg by mouth daily.       Marland Kitchen  diltiazem (CARDIZEM CD) 180 MG 24 hr capsule Take 1 capsule (180 mg total) by mouth every evening.  90 capsule  2  . fexofenadine (ALLEGRA) 180 MG tablet Take 180 mg by mouth daily as needed for allergies or rhinitis.      . furosemide (LASIX) 40 MG tablet Take 1 tablet (40 mg total) by mouth daily. Take one tablet daily until weight is below 190, then one tablet 3 times a week (Mon/Wed/Fri.  90 tablet  3  . guaiFENesin (MUCINEX) 600 MG 12 hr tablet Take 1,200 mg by mouth 2 (two) times daily.      Marland Kitchen levalbuterol (XOPENEX) 0.31 MG/3ML nebulizer solution Take 1 ampule by nebulization 3 (three) times daily.      Marland Kitchen levothyroxine (SYNTHROID, LEVOTHROID) 100 MCG tablet Take 1 tablet (100 mcg total) by mouth daily before breakfast.  90 tablet  1  . meclizine (ANTIVERT) 25 MG tablet Take 25 mg by mouth as needed.      . metoprolol (LOPRESSOR) 100 MG tablet Take 1.5 tablets (150 mg total) by mouth 2 (two) times daily.  270 tablet  3  . mometasone (NASONEX) 50 MCG/ACT nasal spray Place 2 sprays into the nose 2 (two) times daily.      . Multiple Vitamins-Minerals (ICAPS AREDS FORMULA PO) Take 1 capsule by mouth 2 (two) times daily. PER PATIENT THIS IS ICAPS AREDS #2      . Omega 3-6-9 Fatty Acids (OMEGA 3-6-9 COMPLEX PO) Take 1 capsule by mouth 2 (two) times daily.       Marland Kitchen PACERONE 200 MG tablet TAKE ONE TABLET BY MOUTH DAILY.  90 tablet  3  . potassium chloride SA (K-DUR,KLOR-CON) 20 MEQ tablet Take 0.5 tablets (10 mEq total) by mouth daily. When taking furosemide  90 tablet  3  . PROAIR HFA 108 (90 BASE) MCG/ACT inhaler INHALE TWO PUFFS BY MOUTH EVERY 6 HOURS AS NEEDED  9 each  0  . Probiotic Product (ALIGN PO) Take 1 capsule by mouth every morning.       . quinapril (ACCUPRIL) 20 MG tablet TAKE ONE TABLET BY MOUTH ONCE DAILY  90 tablet  3  . triamcinolone (NASACORT AQ) 55 MCG/ACT AERO nasal inhaler 1 spray each nostril twice a day  1 Inhaler  12  . warfarin (COUMADIN) 2.5 MG tablet Take 1-1.5 tablets by  mouth daily as directed by coumadin clinic  135 tablet  2   No current facility-administered medications for this visit.    Allergies:    Allergies  Allergen Reactions  . Metoprolol Other (See Comments)    Bradycardia  . Pseudoephedrine Other (See Comments)    Insomnia (took 1 tablet and could not sleep for 3 days)  . Statins Other (See Comments)    myalgia  . Codeine Nausea Only and Other (See Comments)    dizziness  . Seldane [Terfenadine] Nausea Only and Other (  See Comments)    dizziness    Social History:  The patient  reports that she quit smoking about 24 years ago. Her smoking use included Cigarettes. She has a 35 pack-year smoking history. She has never used smokeless tobacco. She reports that she drinks alcohol. She reports that she does not use illicit drugs.   Family history:   Family History  Problem Relation Age of Onset  . Heart disease Mother   . Stroke Mother   . Other Father     aneurysm  . Coronary artery disease Other   . Stroke Other     ROS:  Please see the history of present illness.  All other systems reviewed and negative.   PHYSICAL EXAM: VS:  BP 174/86  Pulse 60  Ht 5\' 3"  (1.6 m)  Wt 183 lb (83.008 kg)  BMI 32.43 kg/m2 Obese, well developed, in no acute distress HEENT: Pupils are equal round react to light accommodation extraocular movements are intact.  Neck: No cervical lymphadenopathy. Cardiac: Regular rate and rhythm without murmurs rubs or gallops. Lungs:  clear to auscultation bilaterally, no wheezing, rhonchi or rales MS: Patient is mildly tender midthoracic line left side. This is exacerbated with abduction Ext: 1+ lower extremity edema.  2+ radial and dorsalis pedis pulses. Skin: warm and dry Neuro:  Grossly normal  EKG:  Paced rhythm   ASSESSMENT AND PLAN:  Problem List Items Addressed This Visit   Hypertension (Chronic)     Blood pressure is elevated this morning. I rechecked it and it has improved mildly to 174/86.  No  changes at this time the current medication regimen    Back pain     Patient is being seen in the Coumadin clinic and stated she developed severe back pain last night.  During exam the patient's pain is reproducible. She also said that she had gone to the chiropractor earlier today and after that visit her pain was considerably better. She is noncardiac in nature. It has improved. She'll followup as scheduled with Dr. Loletha Grayer.     Other Visit Diagnoses   Chest pain, unspecified chest pain type    -  Primary

## 2014-02-14 NOTE — Assessment & Plan Note (Signed)
Blood pressure is elevated this morning. I rechecked it and it has improved mildly to 174/86.  No changes at this time the current medication regimen

## 2014-03-07 ENCOUNTER — Ambulatory Visit (INDEPENDENT_AMBULATORY_CARE_PROVIDER_SITE_OTHER): Payer: Medicare HMO | Admitting: Pharmacist Clinician (PhC)/ Clinical Pharmacy Specialist

## 2014-03-07 DIAGNOSIS — Z7901 Long term (current) use of anticoagulants: Secondary | ICD-10-CM

## 2014-03-07 LAB — POCT INR: INR: 2.5

## 2014-03-07 MED ORDER — WARFARIN SODIUM 2.5 MG PO TABS: ORAL_TABLET | ORAL | Status: DC

## 2014-03-19 ENCOUNTER — Ambulatory Visit (INDEPENDENT_AMBULATORY_CARE_PROVIDER_SITE_OTHER): Payer: Medicare HMO | Admitting: Internal Medicine

## 2014-03-19 VITALS — BP 180/68 | HR 67 | Temp 97.3°F | Resp 20 | Ht 63.5 in | Wt 197.2 lb

## 2014-03-19 DIAGNOSIS — H8110 Benign paroxysmal vertigo, unspecified ear: Secondary | ICD-10-CM

## 2014-03-19 DIAGNOSIS — Z9981 Dependence on supplemental oxygen: Secondary | ICD-10-CM

## 2014-03-19 DIAGNOSIS — J3089 Other allergic rhinitis: Secondary | ICD-10-CM

## 2014-03-19 DIAGNOSIS — R51 Headache: Secondary | ICD-10-CM

## 2014-03-19 DIAGNOSIS — R609 Edema, unspecified: Secondary | ICD-10-CM

## 2014-03-19 DIAGNOSIS — R519 Headache, unspecified: Secondary | ICD-10-CM

## 2014-03-19 MED ORDER — FLUTICASONE PROPIONATE 50 MCG/ACT NA SUSP: 2.0000 | Freq: Every day | NASAL | Status: AC

## 2014-03-19 MED ORDER — AMOXICILLIN 875 MG PO TABS: 875.0000 mg | ORAL_TABLET | Freq: Two times a day (BID) | ORAL | Status: DC

## 2014-03-19 MED ORDER — MECLIZINE HCL 25 MG PO TABS: 25.0000 mg | ORAL_TABLET | Freq: Every day | ORAL | Status: AC

## 2014-03-19 NOTE — Progress Notes (Addendum)
Subjective:   This chart was scribed for Tami Lin, MD by Forrestine Him, [facility] Scribe. This patient was seen in room 1 and the patient's care was started 5:21 PM.    Patient ID: Carla Black, female    DOB: October 12, 1927, 80 y.o.   MRN: [id number]  Chief Complaint  Patient presents with  . fluid in both ears    pt states she was told by her doc she had fluid in both ears; pt states she is not in pain but more of discomfort; pt states she can feel the movement of the fluid  . balance    pt states her right ear is worse and she states her balance is off    HPI  HPI Comments: Carla Black is a 80 y.o. female with a PMHx of multiple medical problems who presents to [facility] complaining of bilateral ear problems x 2-3 days. Pt states she feels as though she may have fluid in her ears. States she can feel the fluid moving from left to right in her ears as she changes positions. Mrs. Blass admits to nasal congestion at night time only with intermittent rhinorrhea throughout the day. Mrs. Hallgren often wakes in the middle of night secondary to nasal congestion. Pt mentions new onset throbbing R sided HA without any associated symptoms x past week. States HA only comes on at night time when she is sleeping in her recliner. Pt states she also feels her balance is "off" constantly for these past few days. States she is unable to ambulate comfortably without holding onto something to help keep her balance. She denies any fever or chills. No falls/no tremors/no weakness. No chg in hearing   Pt was seen by her eye doctor today and had her pupils dilated. Mrs. Bohnsack states she was then advised to come [facility] today for evaluation of her ears.  Home o2was at 4L when arrived and PO2 [phone] At 3L here she was 98% No new respir sxtoms  Has felt so bad she has skipped lasix 3days so wouldn't have to keep getting up to go to [facility]  Patient  Active Problem List   Diagnosis Date Noted  . Back pain 02/14/2014  . Complete heart block 01/03/2014  . CKD (chronic kidney disease) stage 3, GFR 30-59 ml/min 01/03/2014  . Acute on chronic diastolic heart failure 28/41/3244  . Chronic diastolic heart failure, NYHA class 3b 03/21/2013  . Hypoxemia 12/24/2012  . Cardiac pacemaker in situ 12/21/2012  . H/O thymoma 11/27/2012  . Chronic diastolic heart failure 06/24/7251  . Long term (current) use of anticoagulants 11/22/2012  . PAF (paroxysmal atrial fibrillation) 06/24/2012  . Symptomatic bradycardia 06/24/2012  . S/P cardiac pacemaker procedure, insertion Medtronic device 06/24/12 06/24/2012  . Hypertrophic obstructive cardiomyopathy(425.11) 11/24/2011  . Chronic anticoagulation 11/24/2011  . Hypothyroid 06/02/2011  . Mitral insufficiency due to HOCM and ruptured chorda tendina to the posterior leaflet 05/02/2011  . Hypertension   . Hyperlipidemia   . GERD (gastroesophageal reflux disease)   . Hyperthyroidism   . Coronary artery disease, last cath 9/12-medical Rx    Past Medical History  Diagnosis Date  . Hypertension   . Hyperlipidemia   . Heart murmur   . GERD (gastroesophageal reflux disease)   . Joint pain     left knee  . Dizziness   . Chest tightness 02/09/2008  . Hiatal hernia   . CHF (congestive heart failure)   .  Angina   . Coronary artery disease   . Hypertrophic cardiomyopathy     subvalvular gradient 45-mm  . Dysrhythmia     hx of atrial fibrilation  . On home oxygen therapy     "2L at rest; 3L w/activity" (06/24/2012)  . Pacemaker 06/24/2012  . Melanoma of back 1961  . Complication of anesthesia   . Claustrophobia     "severe" (06/24/2012)  . Carotid artery occlusion   . Pneumonia     "lastest was 04/26/2011; multiple times before that" (06/24/2012)  . Chronic bronchitis     "used to get it often" (06/24/2012)  . Shortness of breath     "all the time" (06/24/2012)  . Hypothyroidism   . Stroke 2012    hx of TIA   . Arthritis     "2 fingers" (06/24/2012)  . PAF (paroxysmal atrial fibrillation) 06/24/2012  . Symptomatic bradycardia 06/24/2012  . S/P cardiac pacemaker procedure, insertion Medtronic device 06/24/12 06/24/2012  . Carotid artery disease 09/21/2011    40-59% R ICA stenosis; patent L carotid endarterectomy w/minimal restenosis; bilateral vertebral arteries are antegrade  . Cataract    Past Surgical History  Procedure Laterality Date  . 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 Microlaryngoscopy with co2 laser and excision of vocal cord lesion  1983  . Carotid endarterectomy  1990's    "left" (06/24/2012)  . Cardiac catheterization  03/14/2011    Stent CX 40% with nml ffr  . Tee without cardioversion  05/01/2011    Procedure: TRANSESOPHAGEAL ECHOCARDIOGRAM (TEE);  Surgeon: Sanda Klein;  Location: [place];  Service: Cardiovascular;  Laterality: N/A;  . Cardioversion  05/01/2011    Procedure: CARDIOVERSION;  Surgeon: Dani Gobble Croitoru;  Location: [place];  Service: Cardiovascular;  Laterality: N/A;  . Cardioversion  06/30/2011    Procedure: CARDIOVERSION;  Surgeon: Sanda Klein, MD;  Location: [place];  Service: Cardiovascular;  Laterality: N/A;  . Insert / replace / remove pacemaker  06/24/2012    dual chamber (06/24/2012)  . Tonsillectomy  1940's?  . Appendectomy  1940's  . Abdominal hysterectomy  1970's  . Dilation and curettage of uterus  1960's - 1970's    "alot" (06/24/2012)  . Cataract extraction w/ intraocular lens  implant, bilateral  2000's  . Melanoma excision  1961    "off my back" (06/24/2012)  . Microlaryngoscopy with co2 laser and excision of vocal cord lesion    . Transthoracic echocardiogram  05/17/2012    see Procedures tab  . Cardiovascular stress test  09/13/2007    R/P MV - EF 56%; mild ischemia in basal and mid inferolateral regions; global LV systolic function normal; EKG negative for ischemia; pt experienced chest pain during  stress test, resolved spontaneously  . Eye surgery     Allergies  Allergen Reactions  . Metoprolol Other (See Comments)    Bradycardia  . Pseudoephedrine Other (See Comments)    Insomnia (took 1 tablet and could not sleep for 3 days)  . Statins Other (See Comments)    myalgia  . Codeine Nausea Only and Other (See Comments)    dizziness  . Seldane [Terfenadine] Nausea Only and Other (See Comments)    dizziness   Prior to Admission medications   Medication Sig Start Date End Date Taking? Authorizing Provider  acetaminophen (TYLENOL) 325 MG tablet Take 1-2 tablets (325-650 mg total) by mouth every 4 (four) hours as needed. 06/25/12  Yes Otilio Carpen  Dorene Ar, NP  aspirin 81 MG tablet Take 81 mg by mouth daily.    Yes Historical Provider, MD  diltiazem (CARDIZEM CD) 180 MG 24 hr capsule Take 1 capsule (180 mg total) by mouth every evening. 08/28/13  Yes Mihai Croitoru, MD  fexofenadine (ALLEGRA) 180 MG tablet Take 180 mg by mouth daily as needed for allergies or rhinitis.   Yes Historical Provider, MD  furosemide (LASIX) 40 MG tablet Take 1 tablet (40 mg total) by mouth daily. Take one tablet daily until weight is below 190, then one tablet 3 times a week (Mon/Wed/Fri. 01/02/14  Yes Mihai Croitoru, MD  guaiFENesin (MUCINEX) 600 MG 12 hr tablet Take 1,200 mg by mouth 2 (two) times daily.   Yes Historical Provider, MD  levalbuterol (XOPENEX) 0.31 MG/3ML nebulizer solution Take 1 ampule by nebulization 3 (three) times daily.   Yes Historical Provider, MD  levothyroxine (SYNTHROID, LEVOTHROID) 100 MCG tablet Take 1 tablet (100 mcg total) by mouth daily before breakfast. 10/21/13  Yes Leandrew Koyanagi, MD  meclizine (ANTIVERT) 25 MG tablet Take 25 mg by mouth as needed.   Yes Historical Provider, MD  metoprolol (LOPRESSOR) 100 MG tablet Take 1.5 tablets (150 mg total) by mouth 2 (two) times daily. 01/02/14  Yes Mihai Croitoru, MD  mometasone (NASONEX) 50 MCG/ACT nasal spray Place 2 sprays into the nose 2 (two)  times daily.   Yes Historical Provider, MD  Multiple Vitamins-Minerals (ICAPS AREDS FORMULA PO) Take 1 capsule by mouth 2 (two) times daily. PER PATIENT THIS IS ICAPS AREDS #2   Yes Historical Provider, MD  Omega 3-6-9 Fatty Acids (OMEGA 3-6-9 COMPLEX PO) Take 1 capsule by mouth 2 (two) times daily.    Yes Historical Provider, MD  PACERONE 200 MG tablet TAKE ONE TABLET BY MOUTH DAILY.   Yes Mihai Croitoru, MD  potassium chloride SA (K-DUR,KLOR-CON) 20 MEQ tablet Take 0.5 tablets (10 mEq total) by mouth daily. When taking furosemide 08/02/13  Yes Mihai Croitoru, MD  PROAIR HFA 108 (90 BASE) MCG/ACT inhaler INHALE TWO PUFFS BY MOUTH EVERY 6 HOURS AS NEEDED 12/28/12  Yes Eleanore E Egan, PA-C  Probiotic Product (ALIGN PO) Take 1 capsule by mouth every morning.    Yes Historical Provider, MD  quinapril (ACCUPRIL) 20 MG tablet TAKE ONE TABLET BY MOUTH ONCE DAILY   Yes Mihai Croitoru, MD  triamcinolone (NASACORT AQ) 55 MCG/ACT AERO nasal inhaler 1 spray each nostril twice a day 10/21/13  Yes Leandrew Koyanagi, MD  warfarin (COUMADIN) 2.5 MG tablet Take 1-1.5 tablets by mouth daily as directed by coumadin clinic 03/07/14  Yes Mihai Croitoru, MD    Review of Systems  Constitutional: Negative for fever and chills.  HENT: Positive for congestion and rhinorrhea.        Fluid in ears bilaterally  Neurological: Positive for dizziness and headaches.  no chest pain No change in SOB other than to note home O2 delivery not as good recently Ankle swelling has incr off lasix 3d She decribes constant problems with nasal congestion, difficulty breathing and then copious PND or nasal rhinorrhea-usu clear tho recntly thicker and yellow at times--has to sleep in recliner and breathing at night is an issue  Triage Vitals: BP 180/68  Pulse 67  Temp(Src) 97.3 F (36.3 C) (Oral)  Resp 20  Ht 5' 3.5" (1.613 m)  Wt 197 lb 3.2 oz (89.449 kg)  BMI 34.38 kg/m2  SpO2 78% on 4L of home machine   Objective:  Physical Exam  Nursing note and vitals reviewed. Constitutional: She is oriented to person, place, and time. She appears well-developed and well-nourished. No distress.  Changed to 4L with our supply and she decr to 3L cause she felt"too much"  And pulse ox at 3L was 98%  HENT:  Head: Normocephalic and atraumatic.  Right Ear: External ear normal.  Left Ear: External ear normal.  Mouth/Throat: Oropharynx is clear and moist.  turbs boggy  Eyes: Conjunctivae and EOM are normal. Pupils are equal, round, and reactive to light.  Neck: Normal range of motion. No thyromegaly present.  Cardiovascular: Normal rate and regular rhythm.   Pulmonary/Chest: Effort normal. No respiratory distress. She has no wheezes.  decr BS//no EtoA  Musculoskeletal:  3+pitting lower extr  Lymphadenopathy:    She has no cervical adenopathy.  Neurological: She is alert and oriented to person, place, and time. She has normal reflexes. No cranial nerve deficit.  No nyst EOM don't create sxtoms FTN and FTO intact Romberg negative No sens or motor losses in extr  Psychiatric: She has a normal mood and affect.    Assessment & Plan:   I personally performed the services described in this documentation, which was scribed in my presence. The recorded information has been reviewed and is accurate.   Positional vertigo ? Etiology-(assoc w/ ear sxt and finding of congestion make treatment a good first alternative)  Headache-new R occiput and temple/postural symptoms  Oxygen dependent  Edema--rec restarting lasix before CHF precipitated--this makes her have inconvenient ur freq for 10 hrs or so  Other allergic rhinitis  Meds ordered this encounter  Medications  . amoxicillin (AMOXIL) 875 MG tablet    Sig: Take 1 tablet (875 mg total) by mouth 2 (two) times daily.    Dispense:  20 tablet    Refill:  0  . fluticasone (FLONASE) 50 MCG/ACT nasal spray    Sig: Place 2 sprays into both nostrils daily. Bid for 1 week then qd 1  month    Dispense:  15.8 g    Refill:  2  . meclizine (ANTIVERT) 25 MG tablet    Sig: Take 1 tablet (25 mg total) by mouth at bedtime.    Dispense:  30 tablet    Refill:  0   If no change in 4-5 days will CT for further eval F/u sooner if worse

## 2014-03-19 NOTE — Patient Instructions (Signed)
Afrin 1 spray in each nostril at bedtime daily for 5 days Meds ordered this encounter  Medications  . amoxicillin (AMOXIL) 875 MG tablet    Sig: Take 1 tablet (875 mg total) by mouth 2 (two) times daily.    Dispense:  20 tablet    Refill:  0  . fluticasone (FLONASE) 50 MCG/ACT nasal spray    Sig: Place 2 sprays into both nostrils daily. Bid for 1 week then qd 1 month    Dispense:  15.8 g    Refill:  2  . meclizine (ANTIVERT) 25 MG tablet    Sig: Take 1 tablet (25 mg total) by mouth at bedtime.    Dispense:  30 tablet    Refill:  0

## 2014-03-27 ENCOUNTER — Telehealth: Payer: Self-pay | Admitting: *Deleted

## 2014-03-27 NOTE — Telephone Encounter (Signed)
Pt would like Dr. Laney Pastor to call her at [phone] to talk to her about the Antivert medication. She is confused and only wants to talk to DR. Doolittle.

## 2014-03-28 MED ORDER — AMOXICILLIN 875 MG PO TABS: 875.0000 mg | ORAL_TABLET | Freq: Two times a day (BID) | ORAL | Status: DC

## 2014-03-28 NOTE — Addendum Note (Signed)
Addended by: Leandrew Koyanagi on: 03/28/2014 06:20 PM   Modules accepted: Orders

## 2014-03-28 NOTE — Telephone Encounter (Signed)
See 9/29-better ---dizziness gone//HA better but not resolved--"fliud" sens in ears still espec with yawning Able to go play bridge this week  Plan another 10d amox/continu flonase as presc Stop antivert F/u 10-14 d

## 2014-04-04 ENCOUNTER — Ambulatory Visit (INDEPENDENT_AMBULATORY_CARE_PROVIDER_SITE_OTHER): Payer: Medicare HMO | Admitting: Pharmacist Clinician (PhC)/ Clinical Pharmacy Specialist

## 2014-04-04 VITALS — BP 128/60 | HR 68

## 2014-04-04 DIAGNOSIS — Z7901 Long term (current) use of anticoagulants: Secondary | ICD-10-CM

## 2014-04-04 LAB — POCT INR: INR: 2.2

## 2014-04-04 LAB — CBC
HCT: 38.9 % (ref 36.0–46.0)
Hemoglobin: 11.9 g/dL — ABNORMAL LOW (ref 12.0–15.0)
MCH: 28.8 pg (ref 26.0–34.0)
MCHC: 30.6 g/dL (ref 30.0–36.0)
MCV: 94.2 fL (ref 78.0–100.0)
Platelets: 195 10*3/uL (ref 150–400)
RBC: 4.13 MIL/uL (ref 3.87–5.11)
RDW: 17 % — ABNORMAL HIGH (ref 11.5–15.5)
WBC: 5.5 10*3/uL (ref 4.0–10.5)

## 2014-04-05 ENCOUNTER — Telehealth: Payer: Self-pay | Admitting: *Deleted

## 2014-04-05 ENCOUNTER — Ambulatory Visit (INDEPENDENT_AMBULATORY_CARE_PROVIDER_SITE_OTHER): Payer: Medicare HMO | Admitting: *Deleted

## 2014-04-05 DIAGNOSIS — D509 Iron deficiency anemia, unspecified: Secondary | ICD-10-CM

## 2014-04-05 DIAGNOSIS — I480 Paroxysmal atrial fibrillation: Secondary | ICD-10-CM

## 2014-04-05 LAB — MDC_IDC_ENUM_SESS_TYPE_REMOTE
Battery Impedance: 158 Ohm
Battery Voltage: 2.79 V
Brady Statistic AP VP Percent: 100 %
Brady Statistic AP VS Percent: 0 %
Brady Statistic AS VP Percent: 0 %
Brady Statistic AS VS Percent: 0 %
Date Time Interrogation Session: 20151015192430
Lead Channel Impedance Value: 487 Ohm
Lead Channel Pacing Threshold Pulse Width: 0.4 ms
Lead Channel Pacing Threshold Pulse Width: 0.4 ms
Lead Channel Setting Pacing Amplitude: 2 V
Lead Channel Setting Sensing Sensitivity: 2.8 mV
MDC IDC MSMT BATTERY REMAINING LONGEVITY: 128 mo
MDC IDC MSMT LEADCHNL RA IMPEDANCE VALUE: 461 Ohm
MDC IDC MSMT LEADCHNL RA PACING THRESHOLD AMPLITUDE: 0.625 V
MDC IDC MSMT LEADCHNL RV PACING THRESHOLD AMPLITUDE: 0.875 V
MDC IDC SET LEADCHNL RA PACING AMPLITUDE: 1.5 V
MDC IDC SET LEADCHNL RV PACING PULSEWIDTH: 0.4 ms

## 2014-04-05 NOTE — Telephone Encounter (Signed)
Lab results called to patient.  Instructed to continue meds and recheck lab in 1 month.  Order mailed to patient.

## 2014-04-06 ENCOUNTER — Telehealth: Payer: Self-pay | Admitting: Cardiovascular Disease

## 2014-04-06 MED ORDER — FERROUS SULFATE 325 (65 FE) MG PO TABS: 325.0000 mg | ORAL_TABLET | Freq: Every day | ORAL | Status: DC

## 2014-04-06 MED ORDER — FOLIC ACID 1 MG PO TABS: 1.0000 mg | ORAL_TABLET | Freq: Every day | ORAL | Status: AC

## 2014-04-06 NOTE — Telephone Encounter (Signed)
Please call asap.She said she talked to you earlier.Says her husband is waiting at the pharmacy.

## 2014-04-06 NOTE — Progress Notes (Signed)
Remote pacemaker transmission.   

## 2014-04-06 NOTE — Telephone Encounter (Signed)
Spoke with patient. Sent in Rx for folic acid 1mg  and ferrous sulfate 325mg . OTC folic acid was only 400mg  and 800mg .

## 2014-04-06 NOTE — Telephone Encounter (Signed)
Spoke with patient.   Sanda Klein, MD Fidel Levy, RN            Please ask her to restart folic acid 1 mg daily and ferrous sulfate 325 mg daily and recheck CBC in 1 month  Thanks      Informed patient of med recommendations per Dr. Loletha Grayer. Patient aware to have labs rechecked in 1 month. Med list updated.

## 2014-04-11 ENCOUNTER — Telehealth: Payer: Self-pay | Admitting: Pharmacist Clinician (PhC)/ Clinical Pharmacy Specialist

## 2014-04-11 NOTE — Telephone Encounter (Signed)
LMOM that wanted to talk about stool color.  States some darker stools recently, knows that they can be black with addition of iron supplements.  She will watch for changes and let us know at next INR check.

## 2014-05-02 ENCOUNTER — Ambulatory Visit: Payer: Medicare HMO | Admitting: Pharmacist Clinician (PhC)/ Clinical Pharmacy Specialist

## 2014-05-03 ENCOUNTER — Other Ambulatory Visit: Payer: Self-pay | Admitting: Internal Medicine

## 2014-05-09 ENCOUNTER — Ambulatory Visit (INDEPENDENT_AMBULATORY_CARE_PROVIDER_SITE_OTHER): Payer: Medicare HMO | Admitting: Pharmacist Clinician (PhC)/ Clinical Pharmacy Specialist

## 2014-05-09 DIAGNOSIS — Z7901 Long term (current) use of anticoagulants: Secondary | ICD-10-CM

## 2014-05-09 LAB — CBC
HCT: 40.7 % (ref 36.0–46.0)
HEMOGLOBIN: 12.5 g/dL (ref 12.0–15.0)
MCH: 29.8 pg (ref 26.0–34.0)
MCHC: 30.7 g/dL (ref 30.0–36.0)
MCV: 96.9 fL (ref 78.0–100.0)
PLATELETS: 177 10*3/uL (ref 150–400)
RBC: 4.2 MIL/uL (ref 3.87–5.11)
RDW: 16.5 % — ABNORMAL HIGH (ref 11.5–15.5)
WBC: 5.2 10*3/uL (ref 4.0–10.5)

## 2014-05-09 LAB — COMPREHENSIVE METABOLIC PANEL
ALT: 41 U/L — AB (ref 0–35)
AST: 35 U/L (ref 0–37)
Albumin: 3.6 g/dL (ref 3.5–5.2)
Alkaline Phosphatase: 79 U/L (ref 39–117)
BUN: 16 mg/dL (ref 6–23)
CALCIUM: 9.1 mg/dL (ref 8.4–10.5)
CHLORIDE: 100 meq/L (ref 96–112)
CO2: 35 meq/L — AB (ref 19–32)
CREATININE: 1.17 mg/dL — AB (ref 0.50–1.10)
Glucose, Bld: 106 mg/dL — ABNORMAL HIGH (ref 70–99)
POTASSIUM: 4.9 meq/L (ref 3.5–5.3)
Sodium: 140 mEq/L (ref 135–145)
Total Bilirubin: 0.7 mg/dL (ref 0.2–1.2)
Total Protein: 7 g/dL (ref 6.0–8.3)

## 2014-05-09 LAB — POCT INR: INR: 2.3

## 2014-05-11 ENCOUNTER — Encounter: Payer: Self-pay | Admitting: Cardiology

## 2014-05-16 ENCOUNTER — Encounter: Payer: Self-pay | Admitting: Cardiovascular Disease

## 2014-05-31 ENCOUNTER — Encounter (HOSPITAL_COMMUNITY): Payer: Self-pay | Admitting: Cardiovascular Disease

## 2014-06-06 ENCOUNTER — Ambulatory Visit (INDEPENDENT_AMBULATORY_CARE_PROVIDER_SITE_OTHER): Payer: Medicare HMO | Admitting: Pharmacist Clinician (PhC)/ Clinical Pharmacy Specialist

## 2014-06-06 DIAGNOSIS — Z7901 Long term (current) use of anticoagulants: Secondary | ICD-10-CM

## 2014-06-06 LAB — POCT INR: INR: 3.4

## 2014-06-20 ENCOUNTER — Other Ambulatory Visit: Payer: Self-pay | Admitting: Physician Assistant

## 2014-06-27 ENCOUNTER — Ambulatory Visit (INDEPENDENT_AMBULATORY_CARE_PROVIDER_SITE_OTHER): Payer: PPO | Admitting: Pharmacist Clinician (PhC)/ Clinical Pharmacy Specialist

## 2014-06-27 DIAGNOSIS — Z7901 Long term (current) use of anticoagulants: Secondary | ICD-10-CM

## 2014-06-27 LAB — POCT INR: INR: 3.8

## 2014-07-04 ENCOUNTER — Encounter: Payer: Self-pay | Admitting: Internal Medicine

## 2014-07-04 ENCOUNTER — Ambulatory Visit (INDEPENDENT_AMBULATORY_CARE_PROVIDER_SITE_OTHER): Payer: PPO | Admitting: Internal Medicine

## 2014-07-04 VITALS — BP 120/70 | HR 73 | Temp 97.6°F | Resp 28 | Ht 64.5 in | Wt 181.0 lb

## 2014-07-04 DIAGNOSIS — R0902 Hypoxemia: Secondary | ICD-10-CM

## 2014-07-04 DIAGNOSIS — E872 Acidosis: Secondary | ICD-10-CM

## 2014-07-04 DIAGNOSIS — R5383 Other fatigue: Secondary | ICD-10-CM

## 2014-07-04 DIAGNOSIS — Z0000 Encounter for general adult medical examination without abnormal findings: Secondary | ICD-10-CM

## 2014-07-04 DIAGNOSIS — Z23 Encounter for immunization: Secondary | ICD-10-CM

## 2014-07-04 DIAGNOSIS — E039 Hypothyroidism, unspecified: Secondary | ICD-10-CM

## 2014-07-04 DIAGNOSIS — R0602 Shortness of breath: Secondary | ICD-10-CM

## 2014-07-04 DIAGNOSIS — J9612 Chronic respiratory failure with hypercapnia: Secondary | ICD-10-CM

## 2014-07-04 DIAGNOSIS — R42 Dizziness and giddiness: Secondary | ICD-10-CM

## 2014-07-04 DIAGNOSIS — M7989 Other specified soft tissue disorders: Secondary | ICD-10-CM

## 2014-07-04 DIAGNOSIS — I10 Essential (primary) hypertension: Secondary | ICD-10-CM

## 2014-07-04 DIAGNOSIS — R630 Anorexia: Secondary | ICD-10-CM

## 2014-07-04 DIAGNOSIS — N184 Chronic kidney disease, stage 4 (severe): Secondary | ICD-10-CM

## 2014-07-04 LAB — CBC WITH DIFFERENTIAL/PLATELET
BASOS ABS: 0 10*3/uL (ref 0.0–0.1)
Basophils Relative: 0 % (ref 0–1)
EOS PCT: 4 % (ref 0–5)
Eosinophils Absolute: 0.2 10*3/uL (ref 0.0–0.7)
HCT: 41.1 % (ref 36.0–46.0)
HEMOGLOBIN: 13 g/dL (ref 12.0–15.0)
LYMPHS PCT: 24 % (ref 12–46)
Lymphs Abs: 1.2 10*3/uL (ref 0.7–4.0)
MCH: 30.9 pg (ref 26.0–34.0)
MCHC: 31.6 g/dL (ref 30.0–36.0)
MCV: 97.6 fL (ref 78.0–100.0)
MONOS PCT: 10 % (ref 3–12)
MPV: 11.3 fL (ref 8.6–12.4)
Monocytes Absolute: 0.5 10*3/uL (ref 0.1–1.0)
Neutro Abs: 3 10*3/uL (ref 1.7–7.7)
Neutrophils Relative %: 62 % (ref 43–77)
Platelets: 189 10*3/uL (ref 150–400)
RBC: 4.21 MIL/uL (ref 3.87–5.11)
RDW: 15.2 % (ref 11.5–15.5)
WBC: 4.8 10*3/uL (ref 4.0–10.5)

## 2014-07-04 LAB — COMPLETE METABOLIC PANEL WITH GFR
ALBUMIN: 3.8 g/dL (ref 3.5–5.2)
ALK PHOS: 92 U/L (ref 39–117)
ALT: 38 U/L — ABNORMAL HIGH (ref 0–35)
AST: 38 U/L — ABNORMAL HIGH (ref 0–37)
BUN: 21 mg/dL (ref 6–23)
CHLORIDE: 96 meq/L (ref 96–112)
CO2: 40 mEq/L — ABNORMAL HIGH (ref 19–32)
Calcium: 9.2 mg/dL (ref 8.4–10.5)
Creat: 1.08 mg/dL (ref 0.50–1.10)
GFR, EST NON AFRICAN AMERICAN: 47 mL/min — AB
GFR, Est African American: 54 mL/min — ABNORMAL LOW
GLUCOSE: 94 mg/dL (ref 70–99)
POTASSIUM: 4.6 meq/L (ref 3.5–5.3)
SODIUM: 142 meq/L (ref 135–145)
TOTAL PROTEIN: 7.1 g/dL (ref 6.0–8.3)
Total Bilirubin: 0.8 mg/dL (ref 0.2–1.2)

## 2014-07-04 LAB — IRON AND TIBC
%SAT: 17 % — AB (ref 20–55)
IRON: 56 ug/dL (ref 42–145)
TIBC: 326 ug/dL (ref 250–470)
UIBC: 270 ug/dL (ref 125–400)

## 2014-07-04 LAB — T4, FREE: Free T4: 1.7 ng/dL (ref 0.80–1.80)

## 2014-07-04 LAB — TSH: TSH: 2.05 u[IU]/mL (ref 0.350–4.500)

## 2014-07-04 NOTE — Patient Instructions (Signed)
We have referred you to the [facility] for this ongoing dizziness and ear issues. We will refill the thyroid medication after we get the results of your thyroid function.   The compression stockings were ordered, and should be at your pharmacy.

## 2014-07-04 NOTE — Progress Notes (Addendum)
Subjective:    Patient ID: Carla Black, female    DOB: 1927/10/20, 81 y.o.   MRN: [id number]  HPI  Chief Complaint  Patient presents with  .   . Dizziness  . Fatigue   81 year old female with PMH listed below is here today for continued complaints of dizziness and fatigue.  Patient had dizziness approx. 4 months ago and was given 2 courses of amoxicillin suspecting sinusitis.  She improved but, she continues to have this dizziness episodically lasting several days at a time. Antivert not very helpful. When she blows her nose, she can feel her ear drum move.  This generally occurs right side>left.  She has noticed that her hearing has diminished some.  Husband agrees. She has a past history of vertigo but this dizziness feels different to her. She has a past history of being evaluated for dizzy spells like this as far back as 2013 but feels it is much worse and much more frequent. She has noticed that she has difficulty with deep inspiration, but she is unsure if this is a decline as she is already on 2.5-3L of oxygen.  She has runny nose, and will use Flonase, Nasacort, and nasonex sporadically without much improvement.   She has had decreased appetite as well.  Leg extremity swelling has continued despite 3 doses lasix weekly..  Elevating reduces the swelling.  She does not use compression stockings.   She has also noticed increased fatigability. Sleeps a lot. Husband says day and night.Still gets out to play bridge 2-3 times a week but not this past week because of dizziness.  Bending over to pick something up will cause her oxygen level to fall and her pulse to increase.     Patient Active Problem List   Diagnosis Date Noted  . Back pain----not recently limiting 02/14/2014  . Complete heart block S/P cardiac pacemaker procedure, insertion [employer] device 06/24/12 Hypertrophic obstructive cardiomyopathy(425.11) Mitral insufficiency due to HOCM and ruptured chorda tendina2012 PAF  (paroxysmal atrial fibrillation)-coumadin since at least 2012/[facility] Hypoxemia---O2 dependent Chronic diastolic heart failure, NYHA class 3b---Bblockade/lasix and KCL3x week   01/03/2014 Dr Sallyanne Kuster appt in Feb  Coumadin being readj.  . CKD (chronic kidney disease) stage 3, GFR 30-59 ml/min--stable>45yr 01/03/2014  . H/O thymoma removal 1991-benign 11/27/2012  . Hypothyroid--tsh wnl 8/15-Synthroid  06/02/2011  . Hypertension-cardizem/accupril 20/   . Hyperlipidemia---myalgias w/ statins   . GERD (gastroesophageal reflux disease)   . Coronary artery disease, last cath 9/12-medical Rx     -  Chronic anemia now stable on Fe/folate   -  AR--Allegra/nasal ster   -  Mildly abn LFTs since 2014   -  CDiff hosp 2012   -  Pneu hosp 2013--sent home on O2 for first time Also on:  Medication Sig Start Date End Date Taking? Authorizing Provider  acetaminophen (TYLENOL) 325 MG tablet Take 1-2 tablets (325-650 mg total) by mouth every 4 (four) hours as needed. 06/25/12  Yes LIsaiah Serge NP  aspirin 81 MG tablet Take 81 mg by mouth daily.    Yes Historical Provider, MD  levalbuterol (XOPENEX) 0.31 MG/3ML nebulizer solution Take 1 ampule by nebulization 3 (three) times daily.   Yes Historical Provider, MD  PROAIR HFA 108 (90 BASE) MCG/ACT inhaler INHALE TWO PUFFS BY MOUTH EVERY 6 HOURS AS NEEDED 12/28/12  Yes ETheda Sers PA-C    History   Social History  . Marital Status: Married    -  Lives at  friends home [place] Topics  . Smoking status: Former Smoker -- 1.00 packs/day for 35 years    Types: Cigarettes    Quit date: 06/22/1989  . Smokeless tobacco: Never Used  . Alcohol Use: 0.0 oz/week   Past Surgical History  Procedure Laterality Date  . 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 Microlaryngoscopy with co2 laser and excision of vocal cord lesion  1983  . Carotid endarterectomy  1990's    "left" (06/24/2012)  .  Cardiac catheterization  03/14/2011    Stent CX 40% with nml ffr  . Tee without cardioversion  05/01/2011    Procedure: TRANSESOPHAGEAL ECHOCARDIOGRAM (TEE);  Surgeon: Sanda Klein;  Location: [facility];  Service: Cardiovascular;  Laterality: N/A;  . Cardioversion  05/01/2011    Procedure: CARDIOVERSION;  Surgeon: Dani Gobble Croitoru;  Location: [place];  Service: Cardiovascular;  Laterality: N/A;  . Cardioversion  06/30/2011    Procedure: CARDIOVERSION;  Surgeon: Sanda Klein, MD;  Location: [facility];  Service: Cardiovascular;  Laterality: N/A;  . Insert / replace / remove pacemaker  06/24/2012    dual chamber (06/24/2012)  . Tonsillectomy  1940's?  . Appendectomy  1940's  . Abdominal hysterectomy  1970's  . Dilation and curettage of uterus  1960's - 1970's    "alot" (06/24/2012)  . Cataract extraction w/ intraocular lens  implant, bilateral  2000's  . Melanoma excision  1961    "off my back" (06/24/2012)  . Microlaryngoscopy with co2 laser and excision of vocal cord lesion    . Transthoracic echocardiogram  05/17/2012    see Procedures tab  . Cardiovascular stress test  09/13/2007    R/P MV - EF 56%; mild ischemia in basal and mid inferolateral regions; global LV systolic function normal; EKG negative for ischemia; pt experienced chest pain during stress test, resolved spontaneously  . Eye surgery    . Permanent pacemaker insertion N/A 06/24/2012    Procedure: PERMANENT PACEMAKER INSERTION;  Surgeon: Sanda Klein, MD;  Location: [place] [facility];  Service: Cardiovascular;  Laterality: N/A;    Review of Systems Has noted weight loss becoming decreased appetite since August No vision changes/no new muscular weakness No fever No purulent sinus drainage No cough or wheezing No chest pain No nausea or vomiting, constipation or diarrhea-continues align No GU complaints No recent change in memory/no headaches/uses a walker frequently with success No recent falls No depression/no sleep  disturbance    Objective:   Physical Exam  Constitutional: She is oriented to person, place, and time. She appears well-developed and well-nourished.  Using portable oxygen at 3 L She is certainly oriented to time person and place  HENT:  Right Ear: A middle ear effusion is present.  Left Ear: External ear normal.  Nose: Nose normal.  Mouth/Throat: Oropharynx is clear and moist.  Bubbles behind the tympanic membrane on the right  Eyes: Conjunctivae and EOM are normal. Pupils are equal, round, and reactive to light.  Neck: Neck supple. No thyromegaly present.  Cardiovascular: Normal rate and regular rhythm.   Rate 65 with a regular rhythm and no murmurs audible  Pulmonary/Chest: Effort normal.  The lungs are clear to auscultation without wheezing on forced expiration and without decreased breath sounds at the bases although there are some fine crackles at both bases.  Abdominal: She exhibits no distension.  Musculoskeletal: She exhibits edema.  She has pitting edema from the knee down bilaterally and has a  chronic appearance without extremity cyanosis or diminish in  pedal pulses  Lymphadenopathy:    She has no cervical adenopathy.  Neurological: She is alert and oriented to person, place, and time. No cranial nerve deficit.  Skin: No rash noted.  Psychiatric: She has a normal mood and affect. Her behavior is normal. Judgment and thought content normal.   BP 120/70 mmHg  Pulse 73  Temp(Src) 97.6 F (36.4 C) (Oral)  Resp 28  Ht 5' 4.5" (1.638 m)  Wt 181 lb (82.101 kg)  BMI 30.60 kg/m2  SpO2 80% Wt Readings from Last 3 Encounters:  07/04/14 181 lb (82.101 kg)  03/19/14 197 lb 3.2 oz (89.449 kg)  02/14/14 183 lb (83.008 kg)  was 184 in 2013  Results for orders placed or performed in visit on 07/04/14  COMPLETE METABOLIC PANEL WITH GFR  Result Value Ref Range   Sodium 142 135 - 145 mEq/L   Potassium 4.6 3.5 - 5.3 mEq/L   Chloride 96 96 - 112 mEq/L   CO2 40 (H) 19 - 32  mEq/L   Glucose, Bld 94 70 - 99 mg/dL   BUN 21 6 - 23 mg/dL   Creat 1.08 0.50 - 1.10 mg/dL   Total Bilirubin 0.8 0.2 - 1.2 mg/dL   Alkaline Phosphatase 92 39 - 117 U/L   AST 38 (H) 0 - 37 U/L   ALT 38 (H) 0 - 35 U/L   Total Protein 7.1 6.0 - 8.3 g/dL   Albumin 3.8 3.5 - 5.2 g/dL   Calcium 9.2 8.4 - 10.5 mg/dL   GFR, Est African American 54 (L) mL/min   GFR, Est Non African American 47 (L) mL/min  TSH  Result Value Ref Range   TSH 2.050 0.350 - 4.500 uIU/mL  CBC with Differential  Result Value Ref Range   WBC 4.8 4.0 - 10.5 K/uL   RBC 4.21 3.87 - 5.11 MIL/uL   Hemoglobin 13.0 12.0 - 15.0 g/dL   HCT 41.1 36.0 - 46.0 %   MCV 97.6 78.0 - 100.0 fL   MCH 30.9 26.0 - 34.0 pg   MCHC 31.6 30.0 - 36.0 g/dL   RDW 15.2 11.5 - 15.5 %   Platelets 189 150 - 400 K/uL   MPV 11.3 8.6 - 12.4 fL   Neutrophils Relative % 62 43 - 77 %   Neutro Abs 3.0 1.7 - 7.7 K/uL   Lymphocytes Relative 24 12 - 46 %   Lymphs Abs 1.2 0.7 - 4.0 K/uL   Monocytes Relative 10 3 - 12 %   Monocytes Absolute 0.5 0.1 - 1.0 K/uL   Eosinophils Relative 4 0 - 5 %   Eosinophils Absolute 0.2 0.0 - 0.7 K/uL   Basophils Relative 0 0 - 1 %   Basophils Absolute 0.0 0.0 - 0.1 K/uL   Smear Review Criteria for review not met   Iron and TIBC  Result Value Ref Range   Iron 56 42 - 145 ug/dL   UIBC 270 125 - 400 ug/dL   TIBC 326 250 - 470 ug/dL   %SAT 17 (L) 20 - 55 %  Brain natriuretic peptide  Result Value Ref Range   Brain Natriuretic Peptide---pending  0.0 - 100.0 pg/mL  T4, Free  Result Value Ref Range   Free T4 1.70 0.80 - 1.80 ng/dL    This office visit required 45 minutes face-to-face plus an additional 45 minutes for chart review assessment and plan     Assessment & Plan:  81 year old female with PMH listed above is here today for dizziness and fatigue and follow-up of her chronic conditions   Chronic hypoxemia requiring home oxygen supplementation since 2013 but with slowly escalating plasma bicarbonate  levels (? Chronic respiratory acidosis) -Oxygen was started during a hospital admission for pneumonia in 2013 and she has been on this since, assuming the hypoxemia secondary to her level of congestive failure -She has never had the diagnosis of chronic lung disease -She has never had the diagnosis of muscle weakness disorder although she had a thymoma removed in the early 1990s -Slowly elevating bicarbonate level over the last 2 years is of concern. Chronic oxygen therapy, some sort of respiratory acidosis, slowly developing pulmonary fibrosis secondary to Pacerone, chronic hypoventilation secondary to some sort of myasthenia or Rita Ohara syndrome all come to mind Pulmonary consultation is needed   Dizziness  -As this is most significantly interfering with her daily activities a consult is indicated --because this is episodic rather than permanent favors vestibular versus neurological origins unless associated with cardiopulmonary status and so she will be Referred to ENT because of the appearance of her right ear, her chronic allergies--Dr. Redmond Baseman is her choice because they are in the same church  Chronic edema secondary to congestive heart failure/other cardiac conditions as noted - Plan: Brain natriuretic peptide,  -PR COMPRESSION STOCKING [id number]  Loss of appetite with recent weight loss  Increase in easy fatigability over the last 6 months  Chronic kidney disease (CKD),-recently stable 2 yrs  Hypothyroidism, unspecified hypothyroidism type - Plan: TSH, T4, Free  Essential hypertension--BP wnl   Need for vaccination with 13-polyvalent pneumococcal conjugate vaccine - Plan: Pneumococcal conjugate vaccine 13-valent IM  Mildly abnormal LFTs -? Secondary to congestive failure versus steatosis versus side effects of Pacerone   Messaged to cardiology for comment lead to this response: Mrs. Byers has congestive heart failure not only due to diastolic dysfunction, but also LV  outflow obstruction from hypertrophic cardiomyopathy and severe mitral insufficiency due to a ruptured chord to the mitral valve.          The HOCM is pretty well treated with beta blockers and right ventricular pacing and I suspect that the mitral insufficiency is the major player in her heart failure. She is not interested in cardiac surgery. She is frequently hypervolemic. She is very worried about taking diuretics and the risk of kidney failure, so she is usually "too wet". If her dyspnea/hypoxia is worsening, I think she would first benefit from more diuretic.        Of course, amiodarone lung toxicity is possible, but less likely than CHF. She had a lot of difficulty with atrial fibrillation and very rapid ventricular rates and worsening CHF a few years ago, with intractable heart failure and a long hospitalization. In fact, atrial fibrillation should be sought out if she has worsening dyspnea/CHF. She should be due for a remote pacemaker check very soon (last done Oct 15, scheduled every 3 months) and the arrhythmia diagnosis would be immediately apparent.       From Dr Recardo Evangelist--- and greatly appreciated

## 2014-07-05 ENCOUNTER — Telehealth: Payer: Self-pay | Admitting: *Deleted

## 2014-07-05 ENCOUNTER — Telehealth: Payer: Self-pay

## 2014-07-05 LAB — BRAIN NATRIURETIC PEPTIDE: Brain Natriuretic Peptide: 976.3 pg/mL — ABNORMAL HIGH (ref 0.0–100.0)

## 2014-07-05 NOTE — Telephone Encounter (Signed)
Please be advised. [facility] called to report pt has a CO2 of 40.

## 2014-07-05 NOTE — Telephone Encounter (Signed)
This has been completed.

## 2014-07-05 NOTE — Telephone Encounter (Signed)
-----   Message from Leandrew Koyanagi, MD sent at 07/05/2014 10:43 AM EST ----- Send a copy of the rx for compr stockings and a copy of labs to her home address

## 2014-07-05 NOTE — Telephone Encounter (Signed)
thx-got it

## 2014-07-06 NOTE — Progress Notes (Addendum)
n

## 2014-07-13 ENCOUNTER — Ambulatory Visit: Payer: Self-pay | Admitting: Pharmacist Clinician (PhC)/ Clinical Pharmacy Specialist

## 2014-07-16 ENCOUNTER — Telehealth: Payer: Self-pay | Admitting: Cardiovascular Disease

## 2014-07-16 NOTE — Telephone Encounter (Signed)
Rx(s) sent to pharmacy electronically. OV 08/14/14

## 2014-07-16 NOTE — Telephone Encounter (Signed)
Pt states she never got her 90 day supply of her thyroid meds,please call as soon as possible,since she will be out of meds   Best phone for pt is [phone]   [facility] [address]

## 2014-07-16 NOTE — Telephone Encounter (Signed)
Rx(s) sent to pharmacy electronically.  

## 2014-07-17 ENCOUNTER — Other Ambulatory Visit: Payer: Self-pay | Admitting: Physician Assistant

## 2014-07-17 ENCOUNTER — Telehealth: Payer: Self-pay

## 2014-07-17 MED ORDER — LEVOTHYROXINE SODIUM 100 MCG PO TABS: ORAL_TABLET | ORAL | Status: DC

## 2014-07-17 NOTE — Telephone Encounter (Signed)
Refill sent to the pharmacy 

## 2014-07-17 NOTE — Telephone Encounter (Signed)
Pt called to state that [facility] has not received the rx refill for her thyroid medication. Please advise. CB # [phone]

## 2014-07-18 ENCOUNTER — Ambulatory Visit: Payer: Self-pay | Admitting: Pharmacist Clinician (PhC)/ Clinical Pharmacy Specialist

## 2014-07-18 ENCOUNTER — Institutional Professional Consult (permissible substitution): Payer: PPO | Admitting: Internal Medicine

## 2014-07-18 MED ORDER — LEVOTHYROXINE SODIUM 100 MCG PO TABS: ORAL_TABLET | ORAL | Status: AC

## 2014-07-18 NOTE — Addendum Note (Signed)
Addended by: Elwyn Reach A on: 07/18/2014 10:45 AM   Modules accepted: Orders

## 2014-07-18 NOTE — Telephone Encounter (Signed)
Pt LMOM that she would like this in 90 day supply. Re-sent Rx and notified pt.

## 2014-07-20 ENCOUNTER — Ambulatory Visit (INDEPENDENT_AMBULATORY_CARE_PROVIDER_SITE_OTHER): Payer: PPO | Admitting: Pharmacist Clinician (PhC)/ Clinical Pharmacy Specialist

## 2014-07-20 DIAGNOSIS — Z7901 Long term (current) use of anticoagulants: Secondary | ICD-10-CM

## 2014-07-20 LAB — POCT INR: INR: 3.6

## 2014-07-24 ENCOUNTER — Institutional Professional Consult (permissible substitution): Payer: PPO | Admitting: Internal Medicine

## 2014-08-03 ENCOUNTER — Ambulatory Visit: Payer: PPO | Admitting: Pharmacist Clinician (PhC)/ Clinical Pharmacy Specialist

## 2014-08-13 ENCOUNTER — Encounter (HOSPITAL_COMMUNITY): Payer: Self-pay | Admitting: Emergency Medicine

## 2014-08-13 ENCOUNTER — Inpatient Hospital Stay (HOSPITAL_COMMUNITY)
Admission: EM | Admit: 2014-08-13 | Discharge: 2014-08-22 | DRG: 438 | Disposition: A | Discharge status: Hospice | Payer: PPO | Attending: Internal Medicine | Admitting: Internal Medicine

## 2014-08-13 ENCOUNTER — Emergency Department (HOSPITAL_COMMUNITY): Payer: PPO

## 2014-08-13 ENCOUNTER — Other Ambulatory Visit (HOSPITAL_COMMUNITY): Payer: Self-pay

## 2014-08-13 ENCOUNTER — Inpatient Hospital Stay (HOSPITAL_COMMUNITY): Payer: PPO

## 2014-08-13 DIAGNOSIS — N179 Acute kidney failure, unspecified: Secondary | ICD-10-CM | POA: Diagnosis present

## 2014-08-13 DIAGNOSIS — Y92002 Bathroom of unspecified non-institutional (private) residence single-family (private) house as the place of occurrence of the external cause: Secondary | ICD-10-CM | POA: Diagnosis not present

## 2014-08-13 DIAGNOSIS — F411 Generalized anxiety disorder: Secondary | ICD-10-CM | POA: Diagnosis present

## 2014-08-13 DIAGNOSIS — R627 Adult failure to thrive: Secondary | ICD-10-CM | POA: Diagnosis present

## 2014-08-13 DIAGNOSIS — Z8249 Family history of ischemic heart disease and other diseases of the circulatory system: Secondary | ICD-10-CM | POA: Diagnosis not present

## 2014-08-13 DIAGNOSIS — R791 Abnormal coagulation profile: Secondary | ICD-10-CM

## 2014-08-13 DIAGNOSIS — R109 Unspecified abdominal pain: Secondary | ICD-10-CM | POA: Diagnosis present

## 2014-08-13 DIAGNOSIS — I272 Other secondary pulmonary hypertension: Secondary | ICD-10-CM | POA: Diagnosis present

## 2014-08-13 DIAGNOSIS — E43 Unspecified severe protein-calorie malnutrition: Secondary | ICD-10-CM | POA: Diagnosis present

## 2014-08-13 DIAGNOSIS — N183 Chronic kidney disease, stage 3 (moderate): Secondary | ICD-10-CM | POA: Diagnosis present

## 2014-08-13 DIAGNOSIS — I5043 Acute on chronic combined systolic (congestive) and diastolic (congestive) heart failure: Secondary | ICD-10-CM | POA: Diagnosis present

## 2014-08-13 DIAGNOSIS — I480 Paroxysmal atrial fibrillation: Secondary | ICD-10-CM | POA: Diagnosis present

## 2014-08-13 DIAGNOSIS — Z7982 Long term (current) use of aspirin: Secondary | ICD-10-CM

## 2014-08-13 DIAGNOSIS — R1010 Upper abdominal pain, unspecified: Secondary | ICD-10-CM | POA: Diagnosis not present

## 2014-08-13 DIAGNOSIS — Z79899 Other long term (current) drug therapy: Secondary | ICD-10-CM

## 2014-08-13 DIAGNOSIS — Z7901 Long term (current) use of anticoagulants: Secondary | ICD-10-CM | POA: Diagnosis not present

## 2014-08-13 DIAGNOSIS — R58 Hemorrhage, not elsewhere classified: Secondary | ICD-10-CM

## 2014-08-13 DIAGNOSIS — I421 Obstructive hypertrophic cardiomyopathy: Secondary | ICD-10-CM | POA: Diagnosis present

## 2014-08-13 DIAGNOSIS — R932 Abnormal findings on diagnostic imaging of liver and biliary tract: Secondary | ICD-10-CM | POA: Diagnosis not present

## 2014-08-13 DIAGNOSIS — K625 Hemorrhage of anus and rectum: Secondary | ICD-10-CM | POA: Diagnosis present

## 2014-08-13 DIAGNOSIS — M1990 Unspecified osteoarthritis, unspecified site: Secondary | ICD-10-CM | POA: Diagnosis present

## 2014-08-13 DIAGNOSIS — R945 Abnormal results of liver function studies: Secondary | ICD-10-CM

## 2014-08-13 DIAGNOSIS — E785 Hyperlipidemia, unspecified: Secondary | ICD-10-CM | POA: Diagnosis present

## 2014-08-13 DIAGNOSIS — Z9981 Dependence on supplemental oxygen: Secondary | ICD-10-CM | POA: Diagnosis not present

## 2014-08-13 DIAGNOSIS — Z8673 Personal history of transient ischemic attack (TIA), and cerebral infarction without residual deficits: Secondary | ICD-10-CM

## 2014-08-13 DIAGNOSIS — R7989 Other specified abnormal findings of blood chemistry: Secondary | ICD-10-CM

## 2014-08-13 DIAGNOSIS — I5033 Acute on chronic diastolic (congestive) heart failure: Secondary | ICD-10-CM | POA: Diagnosis present

## 2014-08-13 DIAGNOSIS — I129 Hypertensive chronic kidney disease with stage 1 through stage 4 chronic kidney disease, or unspecified chronic kidney disease: Secondary | ICD-10-CM | POA: Diagnosis present

## 2014-08-13 DIAGNOSIS — Z90710 Acquired absence of both cervix and uterus: Secondary | ICD-10-CM

## 2014-08-13 DIAGNOSIS — M25559 Pain in unspecified hip: Secondary | ICD-10-CM

## 2014-08-13 DIAGNOSIS — W010XXA Fall on same level from slipping, tripping and stumbling without subsequent striking against object, initial encounter: Secondary | ICD-10-CM | POA: Diagnosis present

## 2014-08-13 DIAGNOSIS — Z6832 Body mass index (BMI) 32.0-32.9, adult: Secondary | ICD-10-CM | POA: Diagnosis not present

## 2014-08-13 DIAGNOSIS — Z950 Presence of cardiac pacemaker: Secondary | ICD-10-CM

## 2014-08-13 DIAGNOSIS — K859 Acute pancreatitis without necrosis or infection, unspecified: Secondary | ICD-10-CM | POA: Diagnosis present

## 2014-08-13 DIAGNOSIS — K8060 Calculus of gallbladder and bile duct with cholecystitis, unspecified, without obstruction: Secondary | ICD-10-CM | POA: Diagnosis present

## 2014-08-13 DIAGNOSIS — I4891 Unspecified atrial fibrillation: Secondary | ICD-10-CM | POA: Diagnosis present

## 2014-08-13 DIAGNOSIS — R54 Age-related physical debility: Secondary | ICD-10-CM | POA: Diagnosis present

## 2014-08-13 DIAGNOSIS — Z66 Do not resuscitate: Secondary | ICD-10-CM | POA: Diagnosis present

## 2014-08-13 DIAGNOSIS — I2510 Atherosclerotic heart disease of native coronary artery without angina pectoris: Secondary | ICD-10-CM | POA: Diagnosis present

## 2014-08-13 DIAGNOSIS — E860 Dehydration: Secondary | ICD-10-CM | POA: Diagnosis present

## 2014-08-13 DIAGNOSIS — E039 Hypothyroidism, unspecified: Secondary | ICD-10-CM | POA: Diagnosis present

## 2014-08-13 DIAGNOSIS — Z87891 Personal history of nicotine dependence: Secondary | ICD-10-CM | POA: Diagnosis not present

## 2014-08-13 DIAGNOSIS — Z515 Encounter for palliative care: Secondary | ICD-10-CM | POA: Diagnosis not present

## 2014-08-13 DIAGNOSIS — K850 Idiopathic acute pancreatitis: Secondary | ICD-10-CM | POA: Diagnosis not present

## 2014-08-13 DIAGNOSIS — I340 Nonrheumatic mitral (valve) insufficiency: Secondary | ICD-10-CM | POA: Diagnosis present

## 2014-08-13 DIAGNOSIS — D72829 Elevated white blood cell count, unspecified: Secondary | ICD-10-CM | POA: Insufficient documentation

## 2014-08-13 DIAGNOSIS — J9611 Chronic respiratory failure with hypoxia: Secondary | ICD-10-CM | POA: Diagnosis present

## 2014-08-13 DIAGNOSIS — M549 Dorsalgia, unspecified: Secondary | ICD-10-CM

## 2014-08-13 DIAGNOSIS — K219 Gastro-esophageal reflux disease without esophagitis: Secondary | ICD-10-CM | POA: Diagnosis present

## 2014-08-13 DIAGNOSIS — K851 Biliary acute pancreatitis: Secondary | ICD-10-CM | POA: Diagnosis not present

## 2014-08-13 DIAGNOSIS — Z7189 Other specified counseling: Secondary | ICD-10-CM | POA: Insufficient documentation

## 2014-08-13 DIAGNOSIS — K819 Cholecystitis, unspecified: Secondary | ICD-10-CM

## 2014-08-13 HISTORY — DX: Acute on chronic diastolic (congestive) heart failure: I50.33

## 2014-08-13 HISTORY — DX: Unspecified atrial fibrillation: I48.91

## 2014-08-13 LAB — URINALYSIS, ROUTINE W REFLEX MICROSCOPIC
Bilirubin Urine: NEGATIVE
Glucose, UA: NEGATIVE mg/dL
KETONES UR: NEGATIVE mg/dL
NITRITE: NEGATIVE
PH: 5 (ref 5.0–8.0)
Protein, ur: 30 mg/dL — AB
Specific Gravity, Urine: 1.021 (ref 1.005–1.030)
Urobilinogen, UA: 1 mg/dL (ref 0.0–1.0)

## 2014-08-13 LAB — COMPREHENSIVE METABOLIC PANEL
ALK PHOS: 207 U/L — AB (ref 39–117)
ALT: 199 U/L — AB (ref 0–35)
ANION GAP: 9 (ref 5–15)
AST: 182 U/L — ABNORMAL HIGH (ref 0–37)
Albumin: 3.1 g/dL — ABNORMAL LOW (ref 3.5–5.2)
BUN: 63 mg/dL — ABNORMAL HIGH (ref 6–23)
CALCIUM: 8.4 mg/dL (ref 8.4–10.5)
CO2: 29 mmol/L (ref 19–32)
Chloride: 99 mmol/L (ref 96–112)
Creatinine, Ser: 2.98 mg/dL — ABNORMAL HIGH (ref 0.50–1.10)
GFR calc non Af Amer: 13 mL/min — ABNORMAL LOW (ref >90)
GFR, EST AFRICAN AMERICAN: 16 mL/min — AB (ref >90)
Glucose, Bld: 86 mg/dL (ref 70–99)
Potassium: 4.4 mmol/L (ref 3.5–5.1)
Sodium: 137 mmol/L (ref 135–145)
Total Bilirubin: 1.7 mg/dL — ABNORMAL HIGH (ref 0.3–1.2)
Total Protein: 7.1 g/dL (ref 6.0–8.3)

## 2014-08-13 LAB — CBC WITH DIFFERENTIAL/PLATELET
BASOS ABS: 0 10*3/uL (ref 0.0–0.1)
BASOS PCT: 0 % (ref 0–1)
EOS PCT: 2 % (ref 0–5)
Eosinophils Absolute: 0.1 10*3/uL (ref 0.0–0.7)
HCT: 39 % (ref 36.0–46.0)
Hemoglobin: 12.4 g/dL (ref 12.0–15.0)
LYMPHS PCT: 6 % — AB (ref 12–46)
Lymphs Abs: 0.5 10*3/uL — ABNORMAL LOW (ref 0.7–4.0)
MCH: 30.5 pg (ref 26.0–34.0)
MCHC: 31.8 g/dL (ref 30.0–36.0)
MCV: 95.8 fL (ref 78.0–100.0)
MONO ABS: 0.7 10*3/uL (ref 0.1–1.0)
Monocytes Relative: 9 % (ref 3–12)
Neutro Abs: 6.9 10*3/uL (ref 1.7–7.7)
Neutrophils Relative %: 83 % — ABNORMAL HIGH (ref 43–77)
PLATELETS: 221 10*3/uL (ref 150–400)
RBC: 4.07 MIL/uL (ref 3.87–5.11)
RDW: 15.9 % — AB (ref 11.5–15.5)
WBC: 8.2 10*3/uL (ref 4.0–10.5)

## 2014-08-13 LAB — URINE MICROSCOPIC-ADD ON

## 2014-08-13 LAB — TYPE AND SCREEN
ABO/RH(D): A POS
ANTIBODY SCREEN: NEGATIVE

## 2014-08-13 LAB — TROPONIN I: Troponin I: 0.05 ng/mL — ABNORMAL HIGH (ref <0.031)

## 2014-08-13 LAB — POC OCCULT BLOOD, ED: Fecal Occult Bld: POSITIVE — AB

## 2014-08-13 LAB — LIPASE, BLOOD: Lipase: 606 U/L — ABNORMAL HIGH (ref 11–59)

## 2014-08-13 LAB — BRAIN NATRIURETIC PEPTIDE: B Natriuretic Peptide: 1326.6 pg/mL — ABNORMAL HIGH (ref 0.0–100.0)

## 2014-08-13 LAB — ABO/RH: ABO/RH(D): A POS

## 2014-08-13 LAB — PROTIME-INR
INR: 4.3 — ABNORMAL HIGH (ref 0.00–1.49)
PROTHROMBIN TIME: 41.6 s — AB (ref 11.6–15.2)

## 2014-08-13 MED ORDER — FERROUS SULFATE 325 (65 FE) MG PO TABS
325.0000 mg | ORAL_TABLET | Freq: Every day | ORAL | Status: DC
  Administered 2014-08-15 – 2014-08-21 (×7): 325 mg via ORAL
  Filled 2014-08-13 (×8): qty 1

## 2014-08-13 MED ORDER — MORPHINE SULFATE 2 MG/ML IJ SOLN
2.0000 mg | INTRAMUSCULAR | Status: DC | PRN
  Administered 2014-08-16 – 2014-08-21 (×9): 2 mg via INTRAVENOUS
  Filled 2014-08-13 (×9): qty 1

## 2014-08-13 MED ORDER — ASPIRIN EC 81 MG PO TBEC
81.0000 mg | DELAYED_RELEASE_TABLET | Freq: Every day | ORAL | Status: DC
  Administered 2014-08-14 – 2014-08-21 (×8): 81 mg via ORAL
  Filled 2014-08-13 (×8): qty 1

## 2014-08-13 MED ORDER — CETYLPYRIDINIUM CHLORIDE 0.05 % MT LIQD
7.0000 mL | Freq: Two times a day (BID) | OROMUCOSAL | Status: DC
  Administered 2014-08-14 – 2014-08-20 (×12): 7 mL via OROMUCOSAL

## 2014-08-13 MED ORDER — OXYCODONE HCL 5 MG PO TABS
5.0000 mg | ORAL_TABLET | ORAL | Status: DC | PRN
  Administered 2014-08-16 – 2014-08-21 (×8): 5 mg via ORAL
  Filled 2014-08-13 (×8): qty 1

## 2014-08-13 MED ORDER — FOLIC ACID 1 MG PO TABS
1.0000 mg | ORAL_TABLET | Freq: Every day | ORAL | Status: DC
  Administered 2014-08-14 – 2014-08-21 (×8): 1 mg via ORAL
  Filled 2014-08-13 (×9): qty 1

## 2014-08-13 MED ORDER — SODIUM CHLORIDE 0.9 % IJ SOLN
3.0000 mL | Freq: Two times a day (BID) | INTRAMUSCULAR | Status: DC
  Administered 2014-08-14 – 2014-08-21 (×14): 3 mL via INTRAVENOUS

## 2014-08-13 MED ORDER — SODIUM CHLORIDE 0.9 % IV BOLUS (SEPSIS): 250.0000 mL | Freq: Once | INTRAVENOUS | Status: DC

## 2014-08-13 MED ORDER — FLUTICASONE PROPIONATE 50 MCG/ACT NA SUSP
2.0000 | Freq: Every day | NASAL | Status: DC
  Administered 2014-08-14 – 2014-08-21 (×7): 2 via NASAL
  Filled 2014-08-13: qty 16

## 2014-08-13 MED ORDER — MECLIZINE HCL 25 MG PO TABS
25.0000 mg | ORAL_TABLET | Freq: Every day | ORAL | Status: DC
  Administered 2014-08-14 – 2014-08-20 (×8): 25 mg via ORAL
  Filled 2014-08-13 (×11): qty 1

## 2014-08-13 MED ORDER — ONDANSETRON HCL 4 MG/2ML IJ SOLN
4.0000 mg | Freq: Four times a day (QID) | INTRAMUSCULAR | Status: DC | PRN
  Administered 2014-08-18: 4 mg via INTRAVENOUS
  Filled 2014-08-13: qty 2

## 2014-08-13 MED ORDER — TRIAMCINOLONE ACETONIDE 55 MCG/ACT NA AERO
1.0000 | INHALATION_SPRAY | Freq: Two times a day (BID) | NASAL | Status: DC
  Filled 2014-08-13: qty 10.8

## 2014-08-13 MED ORDER — CHLORHEXIDINE GLUCONATE 0.12 % MT SOLN
15.0000 mL | Freq: Two times a day (BID) | OROMUCOSAL | Status: DC
  Administered 2014-08-14 – 2014-08-22 (×16): 15 mL via OROMUCOSAL
  Filled 2014-08-13 (×17): qty 15

## 2014-08-13 MED ORDER — LEVALBUTEROL HCL 0.63 MG/3ML IN NEBU
0.3100 mg | INHALATION_SOLUTION | Freq: Three times a day (TID) | RESPIRATORY_TRACT | Status: DC
  Administered 2014-08-14 – 2014-08-15 (×6): 0.315 mg via RESPIRATORY_TRACT
  Administered 2014-08-15: 16:00:00 via RESPIRATORY_TRACT
  Administered 2014-08-16: 0.315 mg via RESPIRATORY_TRACT
  Administered 2014-08-16: 20:00:00 via RESPIRATORY_TRACT
  Administered 2014-08-16 – 2014-08-18 (×6): 0.315 mg via RESPIRATORY_TRACT
  Administered 2014-08-18: 09:00:00 via RESPIRATORY_TRACT
  Administered 2014-08-19 (×3): 0.315 mg via RESPIRATORY_TRACT
  Administered 2014-08-20: 20:00:00 via RESPIRATORY_TRACT
  Filled 2014-08-13 (×23): qty 3

## 2014-08-13 MED ORDER — LORATADINE 10 MG PO TABS
10.0000 mg | ORAL_TABLET | Freq: Every day | ORAL | Status: DC
  Administered 2014-08-14 – 2014-08-21 (×8): 10 mg via ORAL
  Filled 2014-08-13 (×9): qty 1

## 2014-08-13 MED ORDER — DILTIAZEM HCL ER COATED BEADS 180 MG PO CP24
180.0000 mg | ORAL_CAPSULE | Freq: Every evening | ORAL | Status: DC
  Administered 2014-08-14 – 2014-08-21 (×8): 180 mg via ORAL
  Filled 2014-08-13 (×8): qty 1

## 2014-08-13 MED ORDER — METOPROLOL TARTRATE 50 MG PO TABS
150.0000 mg | ORAL_TABLET | Freq: Two times a day (BID) | ORAL | Status: DC
  Administered 2014-08-14 – 2014-08-21 (×14): 150 mg via ORAL
  Filled 2014-08-13 (×16): qty 3

## 2014-08-13 MED ORDER — AMIODARONE HCL 200 MG PO TABS
200.0000 mg | ORAL_TABLET | Freq: Every day | ORAL | Status: DC
  Administered 2014-08-14 – 2014-08-21 (×8): 200 mg via ORAL
  Filled 2014-08-13 (×9): qty 1

## 2014-08-13 MED ORDER — ONDANSETRON HCL 4 MG PO TABS: 4.0000 mg | ORAL_TABLET | Freq: Four times a day (QID) | ORAL | Status: DC | PRN

## 2014-08-13 MED ORDER — LEVOTHYROXINE SODIUM 100 MCG PO TABS
100.0000 ug | ORAL_TABLET | Freq: Every day | ORAL | Status: DC
  Administered 2014-08-14 – 2014-08-21 (×8): 100 ug via ORAL
  Filled 2014-08-13 (×9): qty 1

## 2014-08-13 NOTE — H&P (Signed)
[facility] History and Physical  Patient: Carla Black  MRN: [id number]  DOB: [birth year]  DOS: the patient was seen and examined on 08/13/2014 PCP: Leandrew Koyanagi, MD  Chief Complaint: Abdominal pain  HPI: Carla Black is a 81 y.o. female with Past medical history of hypertension, dyslipidemia, HOCM, chronic combined CHF, chronic hypoxic respiratory failure, pacemaker implant, GERD, A. fib, peripheral vascular disease. The patient presented with complaints of abdominal pain and a fall. Patient mentions that she has been having progressively worsening generalized weakness with fatigue since last one week since she started having complaints of abdominal pain the pain is located on the upper abdominal area. Today while she was trying to walk around she tried dizzy and lightheaded. There was no vertigo No focal deficit. Denies any loss of consciousness.  She also had diarrhea yesterday with multiple bowel movements without any blood but later on she noticed some bright red blood per rectum. Did not have any pain with her bowel movements. No nausea no vomiting. Denies any fever or chills.  The patient is coming from home. And at her baseline independent for most of her ADL.  Review of Systems: as mentioned in the history of present illness.  A Comprehensive review of the other systems is negative.  Past Medical History  Diagnosis Date  . Hypertension   . Hyperlipidemia   . Heart murmur   . GERD (gastroesophageal reflux disease)   . Joint pain     left knee  . Dizziness   . Chest tightness 02/09/2008  . Hiatal hernia   . CHF (congestive heart failure)   . Angina   . Coronary artery disease   . Hypertrophic cardiomyopathy     subvalvular gradient 45-mm  . Dysrhythmia     hx of atrial fibrilation  . On home oxygen therapy     "2L at rest; 3L w/activity" (06/24/2012)  . Pacemaker 06/24/2012  . Melanoma of back 1961  . Complication of anesthesia   .  Claustrophobia     "severe" (06/24/2012)  . Carotid artery occlusion   . Pneumonia     "lastest was 04/26/2011; multiple times before that" (06/24/2012)  . Chronic bronchitis     "used to get it often" (06/24/2012)  . Shortness of breath     "all the time" (06/24/2012)  . Hypothyroidism   . Stroke 2012    hx of TIA  . Arthritis     "2 fingers" (06/24/2012)  . PAF (paroxysmal atrial fibrillation) 06/24/2012  . Symptomatic bradycardia 06/24/2012  . S/P cardiac pacemaker procedure, insertion [employer] device 06/24/12 06/24/2012  . Carotid artery disease 09/21/2011    40-59% R ICA stenosis; patent L carotid endarterectomy w/minimal restenosis; bilateral vertebral arteries are antegrade  . Cataract    Past Surgical History  Procedure Laterality Date  . 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 Microlaryngoscopy with co2 laser and excision of vocal cord lesion  1983  . Carotid endarterectomy  1990's    "left" (06/24/2012)  . Cardiac catheterization  03/14/2011    Stent CX 40% with nml ffr  . Tee without cardioversion  05/01/2011    Procedure: TRANSESOPHAGEAL ECHOCARDIOGRAM (TEE);  Surgeon: Sanda Klein;  Location: [place];  Service: Cardiovascular;  Laterality: N/A;  . Cardioversion  05/01/2011    Procedure: CARDIOVERSION;  Surgeon: Dani Gobble Croitoru;  Location: [facility];  Service: Cardiovascular;  Laterality: N/A;  . Cardioversion  06/30/2011  Procedure: CARDIOVERSION;  Surgeon: Sanda Klein, MD;  Location: [place];  Service: Cardiovascular;  Laterality: N/A;  . Insert / replace / remove pacemaker  06/24/2012    dual chamber (06/24/2012)  . Tonsillectomy  1940's?  . Appendectomy  1940's  . Abdominal hysterectomy  1970's  . Dilation and curettage of uterus  1960's - 1970's    "alot" (06/24/2012)  . Cataract extraction w/ intraocular lens  implant, bilateral  2000's  . Melanoma excision  1961    "off my back" (06/24/2012)  . Microlaryngoscopy with co2 laser and  excision of vocal cord lesion    . Transthoracic echocardiogram  05/17/2012    see Procedures tab  . Cardiovascular stress test  09/13/2007    R/P MV - EF 56%; mild ischemia in basal and mid inferolateral regions; global LV systolic function normal; EKG negative for ischemia; pt experienced chest pain during stress test, resolved spontaneously  . Eye surgery    . Permanent pacemaker insertion N/A 06/24/2012    Procedure: PERMANENT PACEMAKER INSERTION;  Surgeon: Sanda Klein, MD;  Location: [facility];  Service: Cardiovascular;  Laterality: N/A;   Social History:  reports that she quit smoking about 25 years ago. Her smoking use included Cigarettes. She has a 35 pack-year smoking history. She has never used smokeless tobacco. She reports that she drinks alcohol. She reports that she does not use illicit drugs.  Allergies  Allergen Reactions  . Metoprolol Other (See Comments)    Bradycardia  . Pseudoephedrine Other (See Comments)    Insomnia (took 1 tablet and could not sleep for 3 days)  . Statins Other (See Comments)    myalgia  . Codeine Nausea Only and Other (See Comments)    dizziness  . Seldane [Terfenadine] Nausea Only and Other (See Comments)    dizziness    Family History  Problem Relation Age of Onset  . Heart disease Mother   . Stroke Mother   . Other Father     aneurysm  . Coronary artery disease Other   . Stroke Other     Prior to Admission medications   Medication Sig Start Date End Date Taking? Authorizing Provider  acetaminophen (TYLENOL) 325 MG tablet Take 1-2 tablets (325-650 mg total) by mouth every 4 (four) hours as needed. 06/25/12  Yes Isaiah Serge, NP  warfarin (COUMADIN) 2.5 MG tablet Take 1-1.5 tablets by mouth daily as directed by coumadin clinic Patient taking differently: Take 1.25-2.5 mg by mouth daily. Take 1 tablet (2.5 mg) by mouth daily except on Mondays Take 1/2 tablet (1.25 mg) by mouth daily. 03/07/14  Yes Mihai Croitoru, MD  amoxicillin  (AMOXIL) 875 MG tablet Take 1 tablet (875 mg total) by mouth 2 (two) times daily. Patient not taking: Reported on 07/04/2014 03/28/14   Leandrew Koyanagi, MD  aspirin 81 MG tablet Take 81 mg by mouth daily.     Historical Provider, MD  diltiazem (CARDIZEM CD) 180 MG 24 hr capsule Take 1 capsule (180 mg total) by mouth every evening. 08/28/13   Mihai Croitoru, MD  ferrous sulfate 325 (65 FE) MG tablet Take 1 tablet (325 mg total) by mouth daily with breakfast. 04/06/14   Mihai Croitoru, MD  fexofenadine (ALLEGRA) 180 MG tablet Take 180 mg by mouth daily as needed for allergies or rhinitis.    Historical Provider, MD  FLUARIX QUADRIVALENT 0.5 ML injection  04/04/14   Historical Provider, MD  fluticasone (FLONASE) 50 MCG/ACT nasal spray Place 2 sprays into  both nostrils daily. Bid for 1 week then qd 1 month 03/19/14   Leandrew Koyanagi, MD  folic acid (FOLVITE) 1 MG tablet Take 1 tablet (1 mg total) by mouth daily. 04/06/14   Mihai Croitoru, MD  furosemide (LASIX) 40 MG tablet Take 1 tablet (40 mg total) by mouth daily. Take one tablet daily until weight is below 190, then one tablet 3 times a week (Mon/Wed/Fri. 01/02/14   Mihai Croitoru, MD  guaiFENesin (MUCINEX) 600 MG 12 hr tablet Take 1,200 mg by mouth 2 (two) times daily.    Historical Provider, MD  levalbuterol (XOPENEX) 0.31 MG/3ML nebulizer solution Take 1 ampule by nebulization 3 (three) times daily.    Historical Provider, MD  levothyroxine (SYNTHROID, LEVOTHROID) 100 MCG tablet Take one tablet by mouth once daily before breakfast 07/18/14   Orma Flaming, MD  meclizine (ANTIVERT) 25 MG tablet Take 1 tablet (25 mg total) by mouth at bedtime. 03/19/14   Leandrew Koyanagi, MD  metoprolol (LOPRESSOR) 100 MG tablet Take 1.5 tablets (150 mg total) by mouth 2 (two) times daily. 01/02/14   Mihai Croitoru, MD  mometasone (NASONEX) 50 MCG/ACT nasal spray Place 2 sprays into the nose 2 (two) times daily.    Historical Provider, MD  Multiple Vitamins-Minerals  (ICAPS AREDS FORMULA PO) Take 1 capsule by mouth 2 (two) times daily. PER PATIENT THIS IS ICAPS AREDS #2    Historical Provider, MD  Omega 3-6-9 Fatty Acids (OMEGA 3-6-9 COMPLEX PO) Take 1 capsule by mouth 2 (two) times daily.     Historical Provider, MD  PACERONE 200 MG tablet TAKE ONE TABLET BY MOUTH ONCE DAILY 07/16/14   Mihai Croitoru, MD  potassium chloride SA (K-DUR,KLOR-CON) 20 MEQ tablet Take 0.5 tablets (10 mEq total) by mouth daily. When taking furosemide 08/02/13   Mihai Croitoru, MD  PROAIR HFA 108 (90 BASE) MCG/ACT inhaler INHALE TWO PUFFS BY MOUTH EVERY 6 HOURS AS NEEDED 12/28/12   Theda Sers, PA-C  Probiotic Product (ALIGN PO) Take 1 capsule by mouth every morning.     Historical Provider, MD  quinapril (ACCUPRIL) 20 MG tablet TAKE ONE TABLET BY MOUTH ONCE DAILY    Mihai Croitoru, MD  triamcinolone (NASACORT AQ) 55 MCG/ACT AERO nasal inhaler 1 spray each nostril twice a day 10/21/13   Leandrew Koyanagi, MD    Physical Exam: Filed Vitals:   08/13/14 2100 08/13/14 2214 08/13/14 2235 08/13/14 2251  BP: 143/50 146/57 143/56   Pulse: 59 60 59   Temp:   97.5 F (36.4 C)   TempSrc:   Oral   Resp: 18 23 20    Height:    5\' 3"  (1.6 m)  Weight:    83.4 kg (183 lb 13.8 oz)  SpO2: 100% 96% 99%     General: Alert, Awake and Oriented to Time, Place and Person. Appear in mild distress Eyes: PERRL ENT: Oral Mucosa clear moist. Neck: Difficult to assess JVD Cardiovascular: S1 and S2 Present, aortic systolic Murmur, Peripheral Pulses Present Respiratory: Bilateral Air entry equal and Decreased, bilateral basal Crackles, no wheezes Abdomen: Bowel Sound present, Soft and sluggish, not tender Skin: No Rash Extremities: Bilateral Pedal edema, no calf tenderness Neurologic: Grossly no focal neuro deficit.  Labs on Admission:  CBC:  Recent Labs Lab 08/13/14 1733  WBC 8.2  NEUTROABS 6.9  HGB 12.4  HCT 39.0  MCV 95.8  PLT 221    CMP     Component Value Date/Time   NA 137  08/13/2014 1733   K 4.4 08/13/2014 1733   CL 99 08/13/2014 1733   CO2 29 08/13/2014 1733   GLUCOSE 86 08/13/2014 1733   BUN 63* 08/13/2014 1733   CREATININE 2.98* 08/13/2014 1733   CREATININE 1.08 07/04/2014 1332   CALCIUM 8.4 08/13/2014 1733   PROT 7.1 08/13/2014 1733   ALBUMIN 3.1* 08/13/2014 1733   AST 182* 08/13/2014 1733   ALT 199* 08/13/2014 1733   ALKPHOS 207* 08/13/2014 1733   BILITOT 1.7* 08/13/2014 1733   GFRNONAA 13* 08/13/2014 1733   GFRNONAA 47* 07/04/2014 1332   GFRAA 16* 08/13/2014 1733   GFRAA 54* 07/04/2014 1332     Recent Labs Lab 08/13/14 1733  LIPASE 606*     Recent Labs Lab 08/13/14 1733  TROPONINI 0.05*   BNP (last 3 results)  Recent Labs  08/13/14 1733  BNP 1326.6*    ProBNP (last 3 results) No results for input(s): PROBNP in the last 8760 hours.   Radiological Exams on Admission: Ct Abdomen Pelvis Wo Contrast  08/13/2014   CLINICAL DATA:  Bleeding.  No other indication provided.  EXAM: CT ABDOMEN AND PELVIS WITHOUT CONTRAST  TECHNIQUE: Multidetector CT imaging of the abdomen and pelvis was performed following the standard protocol without IV contrast.  COMPARISON:  08/10/2011  FINDINGS: Mild dependent changes in the lung bases. Diffuse cardiac enlargement. Postoperative changes in the mediastinum. Calcification in the mitral valve annulus.  Evaluation of solid organs and vascular structures is limited without IV contrast material. Circumscribed low-attenuation lesion in segment 4 of the liver measuring 2.3 x 3.6 cm. This is unchanged since prior study and probably represents a cyst. Appearance of the gallbladder is nonspecific. There is is suggestion of mild gallbladder wall thickening and infiltration. Single tiny stone identified. Early changes of cholecystitis not excluded. Unenhanced appearance of the pancreas, spleen, kidneys, adrenal glands, and inferior vena cava are unremarkable. Calcification of the abdominal aorta without aneurysm.  Low-attenuation lymph node between the aorta and IVC measures 12 mm in is likely reactive. Stomach, small bowel, and colon are decompressed. Scattered diverticula in the colon. No free air or free fluid in the abdomen. No abnormal retroperitoneal fluid collections.  Pelvis: Diverticulosis of the sigmoid colon without inflammatory change. Uterus appears surgically absent. No pelvic mass or lymphadenopathy. No free or loculated pelvic fluid collections. Appendix is not identified. Bladder wall is not thickened. Small left inguinal hernia containing fat. Degenerative changes in the spine and hips.  IMPRESSION: Possible inflammatory changes around the gallbladder. Correlate clinically for suspicion of cholecystitis. Cyst in the liver. No acute process otherwise identified.   Electronically Signed   By: Lucienne Capers M.D.   On: 08/13/2014 20:24   Ct Head Wo Contrast  08/13/2014   CLINICAL DATA:  Rectal bleeding. Anti coagulation. Bruising along the back of the neck.  EXAM: CT HEAD WITHOUT CONTRAST  CT CERVICAL SPINE WITHOUT CONTRAST  TECHNIQUE: Multidetector CT imaging of the head and cervical spine was performed following the standard protocol without intravenous contrast. Multiplanar CT image reconstructions of the cervical spine were also generated.  COMPARISON:  10/14/2009  FINDINGS: CT HEAD FINDINGS  The brainstem, cerebellum, cerebral peduncles, thalamus, basal ganglia, basilar cisterns, and ventricular system appear within normal limits. Periventricular white matter and corona radiata hypodensities favor chronic ischemic microvascular white matter disease. No intracranial hemorrhage, mass lesion, or acute CVA.  Mild chronic right sphenoid sinusitis. Right mastoid effusion. There is atherosclerotic calcification of the cavernous carotid arteries bilaterally.  CT CERVICAL SPINE  FINDINGS  No cervical spine fracture or significant abnormal subluxation. No prevertebral soft tissue swelling or significant bony  lesion observed. Clips are present in the left neck. No cervical spine  Clips are present in the left neck I do not observe a significant hematoma in the visualized portion of the neck, although please note that the entirety of the neck is not included. The spinous processes appear unremarkable.  IMPRESSION: 1. No acute intracranial findings or acute cervical spine findings. 2. Periventricular white matter and corona radiata hypodensities favor chronic ischemic microvascular white matter disease. 3. Right mastoid effusion with mild chronic right sphenoid sinusitis. 4. No significant hematoma is seen in the visualized portion of the the neck.   Electronically Signed   By: Van Clines M.D.   On: 08/13/2014 20:22   Ct Cervical Spine Wo Contrast  08/13/2014   CLINICAL DATA:  Rectal bleeding. Anti coagulation. Bruising along the back of the neck.  EXAM: CT HEAD WITHOUT CONTRAST  CT CERVICAL SPINE WITHOUT CONTRAST  TECHNIQUE: Multidetector CT imaging of the head and cervical spine was performed following the standard protocol without intravenous contrast. Multiplanar CT image reconstructions of the cervical spine were also generated.  COMPARISON:  10/14/2009  FINDINGS: CT HEAD FINDINGS  The brainstem, cerebellum, cerebral peduncles, thalamus, basal ganglia, basilar cisterns, and ventricular system appear within normal limits. Periventricular white matter and corona radiata hypodensities favor chronic ischemic microvascular white matter disease. No intracranial hemorrhage, mass lesion, or acute CVA.  Mild chronic right sphenoid sinusitis. Right mastoid effusion. There is atherosclerotic calcification of the cavernous carotid arteries bilaterally.  CT CERVICAL SPINE FINDINGS  No cervical spine fracture or significant abnormal subluxation. No prevertebral soft tissue swelling or significant bony lesion observed. Clips are present in the left neck. No cervical spine  Clips are present in the left neck I do not  observe a significant hematoma in the visualized portion of the neck, although please note that the entirety of the neck is not included. The spinous processes appear unremarkable.  IMPRESSION: 1. No acute intracranial findings or acute cervical spine findings. 2. Periventricular white matter and corona radiata hypodensities favor chronic ischemic microvascular white matter disease. 3. Right mastoid effusion with mild chronic right sphenoid sinusitis. 4. No significant hematoma is seen in the visualized portion of the the neck.   Electronically Signed   By: Van Clines M.D.   On: 08/13/2014 20:22   Dg Chest Port 1 View  08/13/2014   CLINICAL DATA:  Rectal bleeding. Dark colored urine for 3 days. Fall 10 days ago. Back pain.  EXAM: PORTABLE CHEST - 1 VIEW  COMPARISON:  06/25/2012  FINDINGS: Moderate enlargement of the cardiopericardial silhouette observed with indistinct pulmonary vasculature, cephalization of blood flow, and bilateral interstitial accentuation in the lungs. Dual lead pacer remains in place. Prior median sternotomy.  Atherosclerotic calcification of the aortic arch. Bandlike opacities in the retrocardiac region likely reflecting left lower lobe atelectasis. Prior blunting of the costophrenic angles appears improved although this may be positional. The overall degree of interstitial edema appears mildly increased.  IMPRESSION: 1. Moderate enlargement of the cardiopericardial silhouette with interstitial edema. Reduced conspicuity of prior pleural effusions. 2. Suspected bandlike atelectasis at the left lung base. 3. Dual lead pacer remains in place.   Electronically Signed   By: Van Clines M.D.   On: 08/13/2014 20:33   US Abdomen Limited Ruq  08/13/2014   CLINICAL DATA:  Abdominal pain. Recent CT with questionable inflammatory change about the  gallbladder.  EXAM: US ABDOMEN LIMITED - RIGHT UPPER QUADRANT  COMPARISON:  CT abdomen/ pelvis earlier this day.  FINDINGS: Gallbladder:   Elongated spanning 11.2 cm. Contains dependent sludge and small echogenic stones. Normal gallbladder wall thickness of 2 mm. No pericholecystic fluid. No sonographic Murphy sign noted.  Common bile duct:  Diameter: 5 mm.  Liver:  Two hepatic cysts again seen, largest measures 3.5 x 2.1 x 2.9 cm. Otherwise within normal limits in parenchymal echogenicity.  IMPRESSION: 1. Distended gallbladder containing dependent sludge and small stones. There are no secondary sonographic findings of acute cholecystitis. Wall thickness is normal, there is a negative sonographic Murphy sign. No biliary dilatation. 2. Hepatic cysts.   Electronically Signed   By: Jeb Levering M.D.   On: 08/13/2014 22:49    Assessment/Plan Principal Problem:   Pancreatitis, acute Active Problems:   Abdominal pain   AKI (acute kidney injury)   Elevated LFTs   Acute on chronic combined systolic and diastolic heart failure   Supratherapeutic INR   BRBPR (bright red blood per rectum)   Abnormal gall bladder diagnostic imaging   1. Pancreatitis, acute The patient is presenting with complaints of abdominal pain. She also had some diarrhea with nausea yesterday. She did have some nausea earlier in the morning as well. With this finding a CT of the abdomen is negative for any acute pancreatitis but she has is definitely elevation of lipase level. With this her findings are more consistent with acute pancreatitis. Along with that she also has evidence of organ damage with acute kidney injury, mildly elevated troponin, possible acute on chronic heart failure. With this she will be treated with conservative management with oral and IV pain medication as well as nausea medications. Closely monitor his intakes and outputs.  2. Acute kidney injury. Etiology currently unclear. BNP elevated. Patient's weight is stable. Thus possibility of cardiorenal hemodynamics is less likely. Patient has acute pancreatitis and has possible evidence of  dehydration with possibility of ATN secondary to dehydration versus infection and inflammation cannot be ruled out. We'll discuss with nephrology for creatinine continues to worsen.  3. Elevated LFTs with orthopedic INR. Next and continue close monitoring most likely secondary to ongoing pancreatitis. Surgery recommends GI consultation as well as hiatus scan in the morning. Patient will remain nothing by mouth overnight.  4. Bright red blood per rectum. H&H 2 is negative. Less likely red blood per rectum. Continue close monitoring.  5 possible CHF. HOCM, MR Continue close monitoring. Holding diuretics at present. If her CBC shows persistent elevation and we will discuss with cardiology in the morning.   Advance goals of care discussion: Full code   Consults: Gen. surgery  DVT Prophylaxis: subcutaneous Heparin  Family Communication: Family was present at bedside, opportunity was given to ask question and all questions were answered satisfactorily at the time of interview. Disposition: Admitted to inpatient in telemetry unit.  Author: Berle Mull, MD [facility] Pager: [phone] 08/13/2014, 11:07 PM    If 7PM-7AM, please contact night-coverage [email or web address] Password [email or web address]

## 2014-08-13 NOTE — ED Provider Notes (Signed)
CSN: [id number]     Arrival date & time 08/13/14  1641 History   First MD Initiated Contact with Patient 08/13/14 1729     Chief Complaint  Patient presents with  . Rectal Bleeding     (Consider location/radiation/quality/duration/timing/severity/associated sxs/prior Treatment) The history is provided by the patient. No language interpreter was used.  Carla Black is an 81 y/o F with PMHx of hypertension, hyperlipidemia, atrial fibrillation on anticoagulation therapy-Coumadin, hiatal hernia, CHF, coronary artery disease with stent placement in 2012, pacemaker, history of stroke in 2012-TIA, cataract surgery presenting to the ED with rectal bleeding that has been ongoing for approximately 2 days. Patient reports that she's been having bright red blood stools with each bowel movement. Reported that the blood is bright red with a mucus tinged. Patient reported that even when not having a bowel movement, she has blood from her rectum-reports that she's been wearing maxi pads. Stated that she had intense abdominal pain Thursday evening localized to the upper aspect of her abdomen describes a constant intense pain that went away the next morning. Patient reported intermittent nausea without episodes of emesis. Patient reported that she is on Coumadin and takes iron as well for anemia-reported that her last INR check was over a month ago. Patient reported that she missed her previous INR check secondary to weather. Stated that she had a fall this afternoon at approximate 2:00-3:00 PM while getting ready to go out. Patient reported that she felt weak in her legs and fell backwards hitting her head on the edge of the tub. Patient reported that she had a fall last week with a similar presentation. Husband reported that he heard the patient yell this afternoon, reported that the patient was alert and oriented-denies confusion or disorientation from the patient. Patient reported that she's been feeling weak for  the past couple of days. Denied back pain, neck pain, dizziness, blurred vision, sudden loss of vision, chest pain, shortness of breath, difficulty breathing, vomiting, numbness, tingling. PCP Dr. Laney Pastor  Past Medical History  Diagnosis Date  . Hypertension   . Hyperlipidemia   . Heart murmur   . GERD (gastroesophageal reflux disease)   . Joint pain     left knee  . Dizziness   . Chest tightness 02/09/2008  . Hiatal hernia   . CHF (congestive heart failure)   . Angina   . Coronary artery disease   . Hypertrophic cardiomyopathy     subvalvular gradient 45-mm  . Dysrhythmia     hx of atrial fibrilation  . On home oxygen therapy     "2L at rest; 3L w/activity" (06/24/2012)  . Pacemaker 06/24/2012  . Melanoma of back 1961  . Complication of anesthesia   . Claustrophobia     "severe" (06/24/2012)  . Carotid artery occlusion   . Pneumonia     "lastest was 04/26/2011; multiple times before that" (06/24/2012)  . Chronic bronchitis     "used to get it often" (06/24/2012)  . Shortness of breath     "all the time" (06/24/2012)  . Hypothyroidism   . Stroke 2012    hx of TIA  . Arthritis     "2 fingers" (06/24/2012)  . PAF (paroxysmal atrial fibrillation) 06/24/2012  . Symptomatic bradycardia 06/24/2012  . S/P cardiac pacemaker procedure, insertion [employer] device 06/24/12 06/24/2012  . Carotid artery disease 09/21/2011    40-59% R ICA stenosis; patent L carotid endarterectomy w/minimal restenosis; bilateral vertebral arteries are antegrade  . Cataract  Past Surgical History  Procedure Laterality Date  . 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 Microlaryngoscopy with co2 laser and excision of vocal cord lesion  1983  . Carotid endarterectomy  1990's    "left" (06/24/2012)  . Cardiac catheterization  03/14/2011    Stent CX 40% with nml ffr  . Tee without cardioversion  05/01/2011    Procedure: TRANSESOPHAGEAL ECHOCARDIOGRAM (TEE);  Surgeon: Sanda Klein;  Location: [facility];  Service: Cardiovascular;  Laterality: N/A;  . Cardioversion  05/01/2011    Procedure: CARDIOVERSION;  Surgeon: Dani Gobble Croitoru;  Location: [facility];  Service: Cardiovascular;  Laterality: N/A;  . Cardioversion  06/30/2011    Procedure: CARDIOVERSION;  Surgeon: Sanda Klein, MD;  Location: [facility];  Service: Cardiovascular;  Laterality: N/A;  . Insert / replace / remove pacemaker  06/24/2012    dual chamber (06/24/2012)  . Tonsillectomy  1940's?  . Appendectomy  1940's  . Abdominal hysterectomy  1970's  . Dilation and curettage of uterus  1960's - 1970's    "alot" (06/24/2012)  . Cataract extraction w/ intraocular lens  implant, bilateral  2000's  . Melanoma excision  1961    "off my back" (06/24/2012)  . Microlaryngoscopy with co2 laser and excision of vocal cord lesion    . Transthoracic echocardiogram  05/17/2012    see Procedures tab  . Cardiovascular stress test  09/13/2007    R/P MV - EF 56%; mild ischemia in basal and mid inferolateral regions; global LV systolic function normal; EKG negative for ischemia; pt experienced chest pain during stress test, resolved spontaneously  . Eye surgery    . Permanent pacemaker insertion N/A 06/24/2012    Procedure: PERMANENT PACEMAKER INSERTION;  Surgeon: Sanda Klein, MD;  Location: [facility];  Service: Cardiovascular;  Laterality: N/A;   Family History  Problem Relation Age of Onset  . Heart disease Mother   . Stroke Mother   . Other Father     aneurysm  . Coronary artery disease Other   . Stroke Other    History  Substance Use Topics  . Smoking status: Former Smoker -- 1.00 packs/day for 35 years    Types: Cigarettes    Quit date: 06/22/1989  . Smokeless tobacco: Never Used  . Alcohol Use: 0.0 oz/week     Comment: 06/24/2012 "suppose to drink 6oz wine qd; haven't had a glass in > 1 month"   OB History    No data available     Review of Systems  Constitutional: Negative for fever and chills.  Eyes:  Negative for visual disturbance.  Respiratory: Negative for chest tightness and shortness of breath.   Cardiovascular: Negative for chest pain.  Gastrointestinal: Positive for nausea, abdominal pain and blood in stool. Negative for vomiting, diarrhea, constipation and anal bleeding.  Musculoskeletal: Negative for back pain and neck pain.  Neurological: Negative for dizziness, weakness, numbness and headaches.      Allergies  Metoprolol; Pseudoephedrine; Statins; Codeine; and Seldane  Home Medications   Prior to Admission medications   Medication Sig Start Date End Date Taking? Authorizing Provider  acetaminophen (TYLENOL) 325 MG tablet Take 1-2 tablets (325-650 mg total) by mouth every 4 (four) hours as needed. 06/25/12  Yes Isaiah Serge, NP  warfarin (COUMADIN) 2.5 MG tablet Take 1-1.5 tablets by mouth daily as directed by coumadin clinic Patient taking differently: Take 1.25-2.5 mg by mouth daily. Take 1 tablet (2.5 mg) by mouth  daily except on Mondays Take 1/2 tablet (1.25 mg) by mouth daily. 03/07/14  Yes Mihai Croitoru, MD  amoxicillin (AMOXIL) 875 MG tablet Take 1 tablet (875 mg total) by mouth 2 (two) times daily. Patient not taking: Reported on 07/04/2014 03/28/14   Leandrew Koyanagi, MD  aspirin 81 MG tablet Take 81 mg by mouth daily.     Historical Provider, MD  diltiazem (CARDIZEM CD) 180 MG 24 hr capsule Take 1 capsule (180 mg total) by mouth every evening. 08/28/13   Mihai Croitoru, MD  ferrous sulfate 325 (65 FE) MG tablet Take 1 tablet (325 mg total) by mouth daily with breakfast. 04/06/14   Mihai Croitoru, MD  fexofenadine (ALLEGRA) 180 MG tablet Take 180 mg by mouth daily as needed for allergies or rhinitis.    Historical Provider, MD  FLUARIX QUADRIVALENT 0.5 ML injection  04/04/14   Historical Provider, MD  fluticasone (FLONASE) 50 MCG/ACT nasal spray Place 2 sprays into both nostrils daily. Bid for 1 week then qd 1 month 03/19/14   Leandrew Koyanagi, MD  folic acid  (FOLVITE) 1 MG tablet Take 1 tablet (1 mg total) by mouth daily. 04/06/14   Mihai Croitoru, MD  furosemide (LASIX) 40 MG tablet Take 1 tablet (40 mg total) by mouth daily. Take one tablet daily until weight is below 190, then one tablet 3 times a week (Mon/Wed/Fri. 01/02/14   Mihai Croitoru, MD  guaiFENesin (MUCINEX) 600 MG 12 hr tablet Take 1,200 mg by mouth 2 (two) times daily.    Historical Provider, MD  levalbuterol (XOPENEX) 0.31 MG/3ML nebulizer solution Take 1 ampule by nebulization 3 (three) times daily.    Historical Provider, MD  levothyroxine (SYNTHROID, LEVOTHROID) 100 MCG tablet Take one tablet by mouth once daily before breakfast 07/18/14   Orma Flaming, MD  meclizine (ANTIVERT) 25 MG tablet Take 1 tablet (25 mg total) by mouth at bedtime. 03/19/14   Leandrew Koyanagi, MD  metoprolol (LOPRESSOR) 100 MG tablet Take 1.5 tablets (150 mg total) by mouth 2 (two) times daily. 01/02/14   Mihai Croitoru, MD  mometasone (NASONEX) 50 MCG/ACT nasal spray Place 2 sprays into the nose 2 (two) times daily.    Historical Provider, MD  Multiple Vitamins-Minerals (ICAPS AREDS FORMULA PO) Take 1 capsule by mouth 2 (two) times daily. PER PATIENT THIS IS ICAPS AREDS #2    Historical Provider, MD  Omega 3-6-9 Fatty Acids (OMEGA 3-6-9 COMPLEX PO) Take 1 capsule by mouth 2 (two) times daily.     Historical Provider, MD  PACERONE 200 MG tablet TAKE ONE TABLET BY MOUTH ONCE DAILY 07/16/14   Mihai Croitoru, MD  potassium chloride SA (K-DUR,KLOR-CON) 20 MEQ tablet Take 0.5 tablets (10 mEq total) by mouth daily. When taking furosemide 08/02/13   Mihai Croitoru, MD  PROAIR HFA 108 (90 BASE) MCG/ACT inhaler INHALE TWO PUFFS BY MOUTH EVERY 6 HOURS AS NEEDED 12/28/12   Theda Sers, PA-C  Probiotic Product (ALIGN PO) Take 1 capsule by mouth every morning.     Historical Provider, MD  quinapril (ACCUPRIL) 20 MG tablet TAKE ONE TABLET BY MOUTH ONCE DAILY    Mihai Croitoru, MD  triamcinolone (NASACORT AQ) 55 MCG/ACT AERO  nasal inhaler 1 spray each nostril twice a day 10/21/13   Leandrew Koyanagi, MD   BP 139/52 mmHg  Pulse 62  Temp(Src) 97.9 F (36.6 C) (Axillary)  Resp 20  Ht 5\' 3"  (1.6 m)  Wt 183 lb 13.8 oz (83.4 kg)  BMI 32.58 kg/m2  SpO2 98% Physical Exam  Constitutional: She is oriented to person, place, and time. She appears well-developed and well-nourished. No distress.  HENT:  Head: Normocephalic and atraumatic.  Mouth/Throat: Oropharynx is clear and moist. No oropharyngeal exudate.  Eyes: Conjunctivae and EOM are normal. Pupils are equal, round, and reactive to light. Right eye exhibits no discharge. Left eye exhibits no discharge.  Neck: Normal range of motion. Neck supple. No tracheal deviation present.  Cardiovascular: Normal rate, regular rhythm and normal heart sounds.  Exam reveals no friction rub.   No murmur heard. Pulses:      Radial pulses are 2+ on the right side, and 2+ on the left side.       Dorsalis pedis pulses are 2+ on the right side, and 2+ on the left side.  Swelling and 2+ pitting edema identified to lower tremors bilaterally, ranging from ankles to just below the knee bilaterally  Pulmonary/Chest: Effort normal and breath sounds normal. No respiratory distress. She has no wheezes. She has no rales.  Abdominal: Soft. Bowel sounds are normal. She exhibits no distension. There is tenderness. There is no rebound and no guarding.  Genitourinary:  Rectal exam: Negative swelling, erythema, inflammation, lesions, sores, hemorrhoids identified. Negative bright red blood per rectum. Strong sphincter tone. Negative masses or polyps palpated on examination. Blood-tinged mucus noted on glove was brown stools. Exam chaperoned with nurse, Phineas [facility]  Musculoskeletal: Normal range of motion.  Lymphadenopathy:    She has no cervical adenopathy.  Neurological: She is alert and oriented to person, place, and time. No cranial nerve deficit. She exhibits normal muscle tone. Coordination normal.   Cranial nerves grossly intact Strength 5+/5+ to upper and lower extremities bilaterally with resistance applied, equal distribution noted Equal grip bilaterally Patient follows commands well Patient response to questions appropriately Negative facial droop Negative slurred speech Negative aphasia  Skin: Skin is warm and dry. No rash noted. She is not diaphoretic. No erythema.  Psychiatric: She has a normal mood and affect. Her behavior is normal. Thought content normal.  Nursing note and vitals reviewed.   ED Course  Procedures (including critical care time)   Results for orders placed or performed during the hospital encounter of 08/13/14  CBC with Differential/Platelet  Result Value Ref Range   WBC 8.2 4.0 - 10.5 K/uL   RBC 4.07 3.87 - 5.11 MIL/uL   Hemoglobin 12.4 12.0 - 15.0 g/dL   HCT 39.0 36.0 - 46.0 %   MCV 95.8 78.0 - 100.0 fL   MCH 30.5 26.0 - 34.0 pg   MCHC 31.8 30.0 - 36.0 g/dL   RDW 15.9 (H) 11.5 - 15.5 %   Platelets 221 150 - 400 K/uL   Neutrophils Relative % 83 (H) 43 - 77 %   Neutro Abs 6.9 1.7 - 7.7 K/uL   Lymphocytes Relative 6 (L) 12 - 46 %   Lymphs Abs 0.5 (L) 0.7 - 4.0 K/uL   Monocytes Relative 9 3 - 12 %   Monocytes Absolute 0.7 0.1 - 1.0 K/uL   Eosinophils Relative 2 0 - 5 %   Eosinophils Absolute 0.1 0.0 - 0.7 K/uL   Basophils Relative 0 0 - 1 %   Basophils Absolute 0.0 0.0 - 0.1 K/uL  Comprehensive metabolic panel  Result Value Ref Range   Sodium 137 135 - 145 mmol/L   Potassium 4.4 3.5 - 5.1 mmol/L   Chloride 99 96 - 112 mmol/L   CO2 29 19 - 32  mmol/L   Glucose, Bld 86 70 - 99 mg/dL   BUN 63 (H) 6 - 23 mg/dL   Creatinine, Ser 2.98 (H) 0.50 - 1.10 mg/dL   Calcium 8.4 8.4 - 10.5 mg/dL   Total Protein 7.1 6.0 - 8.3 g/dL   Albumin 3.1 (L) 3.5 - 5.2 g/dL   AST 182 (H) 0 - 37 U/L   ALT 199 (H) 0 - 35 U/L   Alkaline Phosphatase 207 (H) 39 - 117 U/L   Total Bilirubin 1.7 (H) 0.3 - 1.2 mg/dL   GFR calc non Af Amer 13 (L) >90 mL/min   GFR calc  Af Amer 16 (L) >90 mL/min   Anion gap 9 5 - 15  Lipase, blood  Result Value Ref Range   Lipase 606 (H) 11 - 59 U/L  Protime-INR  Result Value Ref Range   Prothrombin Time 41.6 (H) 11.6 - 15.2 seconds   INR 4.30 (H) 0.00 - 1.49  Urinalysis, Routine w reflex microscopic  Result Value Ref Range   Color, Urine YELLOW YELLOW   APPearance CLOUDY (A) CLEAR   Specific Gravity, Urine 1.021 1.005 - 1.030   pH 5.0 5.0 - 8.0   Glucose, UA NEGATIVE NEGATIVE mg/dL   Hgb urine dipstick SMALL (A) NEGATIVE   Bilirubin Urine NEGATIVE NEGATIVE   Ketones, ur NEGATIVE NEGATIVE mg/dL   Protein, ur 30 (A) NEGATIVE mg/dL   Urobilinogen, UA 1.0 0.0 - 1.0 mg/dL   Nitrite NEGATIVE NEGATIVE   Leukocytes, UA SMALL (A) NEGATIVE  Troponin I  Result Value Ref Range   Troponin I 0.05 (H) <0.031 ng/mL  Brain natriuretic peptide  Result Value Ref Range   B Natriuretic Peptide 1326.6 (H) 0.0 - 100.0 pg/mL  Urine microscopic-add on  Result Value Ref Range   Squamous Epithelial / LPF RARE RARE   WBC, UA 3-6 <3 WBC/hpf   RBC / HPF 3-6 <3 RBC/hpf   Bacteria, UA MANY (A) RARE  POC occult blood, ED  Result Value Ref Range   Fecal Occult Bld POSITIVE (A) NEGATIVE  Type and screen for Red Blood Exchange  Result Value Ref Range   ABO/RH(D) A POS    Antibody Screen NEG    Sample Expiration 08/16/2014   ABO/Rh  Result Value Ref Range   ABO/RH(D) A POS     Labs Review Labs Reviewed  CBC WITH DIFFERENTIAL/PLATELET - Abnormal; Notable for the following:    RDW 15.9 (*)    Neutrophils Relative % 83 (*)    Lymphocytes Relative 6 (*)    Lymphs Abs 0.5 (*)    All other components within normal limits  COMPREHENSIVE METABOLIC PANEL - Abnormal; Notable for the following:    BUN 63 (*)    Creatinine, Ser 2.98 (*)    Albumin 3.1 (*)    AST 182 (*)    ALT 199 (*)    Alkaline Phosphatase 207 (*)    Total Bilirubin 1.7 (*)    GFR calc non Af Amer 13 (*)    GFR calc Af Amer 16 (*)    All other components within  normal limits  LIPASE, BLOOD - Abnormal; Notable for the following:    Lipase 606 (*)    All other components within normal limits  PROTIME-INR - Abnormal; Notable for the following:    Prothrombin Time 41.6 (*)    INR 4.30 (*)    All other components within normal limits  URINALYSIS, ROUTINE W REFLEX MICROSCOPIC - Abnormal; Notable for  the following:    APPearance CLOUDY (*)    Hgb urine dipstick SMALL (*)    Protein, ur 30 (*)    Leukocytes, UA SMALL (*)    All other components within normal limits  TROPONIN I - Abnormal; Notable for the following:    Troponin I 0.05 (*)    All other components within normal limits  BRAIN NATRIURETIC PEPTIDE - Abnormal; Notable for the following:    B Natriuretic Peptide 1326.6 (*)    All other components within normal limits  URINE MICROSCOPIC-ADD ON - Abnormal; Notable for the following:    Bacteria, UA MANY (*)    All other components within normal limits  POC OCCULT BLOOD, ED - Abnormal; Notable for the following:    Fecal Occult Bld POSITIVE (*)    All other components within normal limits  OCCULT BLOOD X 1 CARD TO LAB, STOOL  RETICULOCYTES  CBC  TROPONIN I  TROPONIN I  TROPONIN I  TYPE AND SCREEN  ABO/RH    Imaging Review Ct Abdomen Pelvis Wo Contrast  08/13/2014   CLINICAL DATA:  Bleeding.  No other indication provided.  EXAM: CT ABDOMEN AND PELVIS WITHOUT CONTRAST  TECHNIQUE: Multidetector CT imaging of the abdomen and pelvis was performed following the standard protocol without IV contrast.  COMPARISON:  08/10/2011  FINDINGS: Mild dependent changes in the lung bases. Diffuse cardiac enlargement. Postoperative changes in the mediastinum. Calcification in the mitral valve annulus.  Evaluation of solid organs and vascular structures is limited without IV contrast material. Circumscribed low-attenuation lesion in segment 4 of the liver measuring 2.3 x 3.6 cm. This is unchanged since prior study and probably represents a cyst. Appearance  of the gallbladder is nonspecific. There is is suggestion of mild gallbladder wall thickening and infiltration. Single tiny stone identified. Early changes of cholecystitis not excluded. Unenhanced appearance of the pancreas, spleen, kidneys, adrenal glands, and inferior vena cava are unremarkable. Calcification of the abdominal aorta without aneurysm. Low-attenuation lymph node between the aorta and IVC measures 12 mm in is likely reactive. Stomach, small bowel, and colon are decompressed. Scattered diverticula in the colon. No free air or free fluid in the abdomen. No abnormal retroperitoneal fluid collections.  Pelvis: Diverticulosis of the sigmoid colon without inflammatory change. Uterus appears surgically absent. No pelvic mass or lymphadenopathy. No free or loculated pelvic fluid collections. Appendix is not identified. Bladder wall is not thickened. Small left inguinal hernia containing fat. Degenerative changes in the spine and hips.  IMPRESSION: Possible inflammatory changes around the gallbladder. Correlate clinically for suspicion of cholecystitis. Cyst in the liver. No acute process otherwise identified.   Electronically Signed   By: Lucienne Capers M.D.   On: 08/13/2014 20:24   Ct Head Wo Contrast  08/13/2014   CLINICAL DATA:  Rectal bleeding. Anti coagulation. Bruising along the back of the neck.  EXAM: CT HEAD WITHOUT CONTRAST  CT CERVICAL SPINE WITHOUT CONTRAST  TECHNIQUE: Multidetector CT imaging of the head and cervical spine was performed following the standard protocol without intravenous contrast. Multiplanar CT image reconstructions of the cervical spine were also generated.  COMPARISON:  10/14/2009  FINDINGS: CT HEAD FINDINGS  The brainstem, cerebellum, cerebral peduncles, thalamus, basal ganglia, basilar cisterns, and ventricular system appear within normal limits. Periventricular white matter and corona radiata hypodensities favor chronic ischemic microvascular white matter disease. No  intracranial hemorrhage, mass lesion, or acute CVA.  Mild chronic right sphenoid sinusitis. Right mastoid effusion. There is atherosclerotic calcification of the cavernous  carotid arteries bilaterally.  CT CERVICAL SPINE FINDINGS  No cervical spine fracture or significant abnormal subluxation. No prevertebral soft tissue swelling or significant bony lesion observed. Clips are present in the left neck. No cervical spine  Clips are present in the left neck I do not observe a significant hematoma in the visualized portion of the neck, although please note that the entirety of the neck is not included. The spinous processes appear unremarkable.  IMPRESSION: 1. No acute intracranial findings or acute cervical spine findings. 2. Periventricular white matter and corona radiata hypodensities favor chronic ischemic microvascular white matter disease. 3. Right mastoid effusion with mild chronic right sphenoid sinusitis. 4. No significant hematoma is seen in the visualized portion of the the neck.   Electronically Signed   By: Van Clines M.D.   On: 08/13/2014 20:22   Ct Cervical Spine Wo Contrast  08/13/2014   CLINICAL DATA:  Rectal bleeding. Anti coagulation. Bruising along the back of the neck.  EXAM: CT HEAD WITHOUT CONTRAST  CT CERVICAL SPINE WITHOUT CONTRAST  TECHNIQUE: Multidetector CT imaging of the head and cervical spine was performed following the standard protocol without intravenous contrast. Multiplanar CT image reconstructions of the cervical spine were also generated.  COMPARISON:  10/14/2009  FINDINGS: CT HEAD FINDINGS  The brainstem, cerebellum, cerebral peduncles, thalamus, basal ganglia, basilar cisterns, and ventricular system appear within normal limits. Periventricular white matter and corona radiata hypodensities favor chronic ischemic microvascular white matter disease. No intracranial hemorrhage, mass lesion, or acute CVA.  Mild chronic right sphenoid sinusitis. Right mastoid effusion. There  is atherosclerotic calcification of the cavernous carotid arteries bilaterally.  CT CERVICAL SPINE FINDINGS  No cervical spine fracture or significant abnormal subluxation. No prevertebral soft tissue swelling or significant bony lesion observed. Clips are present in the left neck. No cervical spine  Clips are present in the left neck I do not observe a significant hematoma in the visualized portion of the neck, although please note that the entirety of the neck is not included. The spinous processes appear unremarkable.  IMPRESSION: 1. No acute intracranial findings or acute cervical spine findings. 2. Periventricular white matter and corona radiata hypodensities favor chronic ischemic microvascular white matter disease. 3. Right mastoid effusion with mild chronic right sphenoid sinusitis. 4. No significant hematoma is seen in the visualized portion of the the neck.   Electronically Signed   By: Van Clines M.D.   On: 08/13/2014 20:22   Dg Chest Port 1 View  08/13/2014   CLINICAL DATA:  Rectal bleeding. Dark colored urine for 3 days. Fall 10 days ago. Back pain.  EXAM: PORTABLE CHEST - 1 VIEW  COMPARISON:  06/25/2012  FINDINGS: Moderate enlargement of the cardiopericardial silhouette observed with indistinct pulmonary vasculature, cephalization of blood flow, and bilateral interstitial accentuation in the lungs. Dual lead pacer remains in place. Prior median sternotomy.  Atherosclerotic calcification of the aortic arch. Bandlike opacities in the retrocardiac region likely reflecting left lower lobe atelectasis. Prior blunting of the costophrenic angles appears improved although this may be positional. The overall degree of interstitial edema appears mildly increased.  IMPRESSION: 1. Moderate enlargement of the cardiopericardial silhouette with interstitial edema. Reduced conspicuity of prior pleural effusions. 2. Suspected bandlike atelectasis at the left lung base. 3. Dual lead pacer remains in place.    Electronically Signed   By: Van Clines M.D.   On: 08/13/2014 20:33   US Abdomen Limited Ruq  08/13/2014   CLINICAL DATA:  Abdominal pain. Recent  CT with questionable inflammatory change about the gallbladder.  EXAM: US ABDOMEN LIMITED - RIGHT UPPER QUADRANT  COMPARISON:  CT abdomen/ pelvis earlier this day.  FINDINGS: Gallbladder:  Elongated spanning 11.2 cm. Contains dependent sludge and small echogenic stones. Normal gallbladder wall thickness of 2 mm. No pericholecystic fluid. No sonographic Murphy sign noted.  Common bile duct:  Diameter: 5 mm.  Liver:  Two hepatic cysts again seen, largest measures 3.5 x 2.1 x 2.9 cm. Otherwise within normal limits in parenchymal echogenicity.  IMPRESSION: 1. Distended gallbladder containing dependent sludge and small stones. There are no secondary sonographic findings of acute cholecystitis. Wall thickness is normal, there is a negative sonographic Murphy sign. No biliary dilatation. 2. Hepatic cysts.   Electronically Signed   By: Jeb Levering M.D.   On: 08/13/2014 22:49     EKG Interpretation None      9:14 PM This provider spoke with Dr. Posey Pronto, [facility]. Discussed case, labs, imaging, ED course, vitals in great detail. CCS to be consulted. Patient to be admitted to Telemetry for CHF exacerbation, acute renal failure, cholecystitis, elevated troponin, rectal bleed, supratherapeutic INR.  9:42 PM This provider spoke with Dr. Marlou Starks, [place]. As per physician, reported that patient may need percutaneous drain. Surgery to assess patient.   MDM   Final diagnoses:  Acute pancreatitis, unspecified pancreatitis type  Supratherapeutic INR  Acute kidney injury  Rectal bleeding  Chronic anticoagulation  Abdominal pain    Medications  chlorhexidine (PERIDEX) 0.12 % solution 15 mL (0 mLs Mouth Rinse Hold 08/13/14 2330)  antiseptic oral rinse (CPC / CETYLPYRIDINIUM CHLORIDE 0.05%) solution 7 mL (not administered)  levothyroxine (SYNTHROID,  LEVOTHROID) tablet 100 mcg (not administered)  amiodarone (PACERONE) tablet 200 mg (not administered)  ferrous sulfate tablet 325 mg (not administered)  folic acid (FOLVITE) tablet 1 mg (not administered)  fluticasone (FLONASE) 50 MCG/ACT nasal spray 2 spray (not administered)  meclizine (ANTIVERT) tablet 25 mg (not administered)  metoprolol (LOPRESSOR) tablet 150 mg (not administered)  loratadine (CLARITIN) tablet 10 mg (not administered)  triamcinolone (NASACORT) nasal inhaler 1 spray (not administered)  diltiazem (CARDIZEM CD) 24 hr capsule 180 mg (not administered)  levalbuterol (XOPENEX) nebulizer solution 0.31 mg (not administered)  aspirin tablet 81 mg (not administered)  sodium chloride 0.9 % injection 3 mL (not administered)  oxyCODONE (Oxy IR/ROXICODONE) immediate release tablet 5 mg (not administered)  ondansetron (ZOFRAN) tablet 4 mg (not administered)    Or  ondansetron (ZOFRAN) injection 4 mg (not administered)  morphine 2 MG/ML injection 2 mg (not administered)    Filed Vitals:   08/13/14 2214 08/13/14 2235 08/13/14 2251 08/13/14 2351  BP: 146/57 143/56  139/52  Pulse: 60 59  62  Temp:  97.5 F (36.4 C)  97.9 F (36.6 C)  TempSrc:  Oral  Axillary  Resp: 23 20  20   Height:   5\' 3"  (1.6 m)   Weight:   183 lb 13.8 oz (83.4 kg)   SpO2: 96% 99%  98%   EKG atrial dual paced rhythm paced - 60 bpm. Troponin elevated at 0.05. PT 41.6, INR 4.30-patient is supratherapeutic. BNP 1326.6. CBC negative elevated leukocytosis. Hemoglobin 12.4, hematocrit 39.0. CMP noted elevated BUN of 63, creatinine of 2.98 - when compared to labs and proximal one month ago, creatinine was 1.08 and BUN of 21. Elevated AST of 182, ALT of 109, alkaline phosphatase of 207, bilirubin of 1.7. Glucose 86, negative elevated anion gap. Lipase elevated at 606. Fecal occult positive. Type and  screen performed. Urinalysis noted small hemoglobin with small leukocytes-WBC 3-6, RBC 3-6. CT head no acute  intracranial findings or acute cervical spine findings. Periventricular white matter and corona radiata hypodensities favor chronic ischemic microvascular white matter disease. Right mastoid effusion and chronic right sphenoid sinus and tinnitus. No significant hematoma is seen in the visualized portion of the neck.  CT abdomen and pelvis without contrast noted a possible inflammatory changes around the gallbladder-suspicion of cholecystitis area no acute process otherwise identified. Chest x-ray noted moderate enlargement of the cardiopericardial silhouette with interstitial edema. RUQ [place] noted distended gallbladder containing dependent sludge and small stones - no findings of acute cholecystitis.  Patient presenting to the ED with what appears to be acute pancreatitis with a lipase of 606. Rectal bleeding noted-negative bright red blood per rectum, hemoglobin stable at this time at 12.4. Patient has supratherapeutic INR of 4.30-patient has not had INR performed in approximately 2 months-chronic anticoagulation secondary to atrial fibrillation. New onset of acute kidney injury with elevated creatinine of 2.98, has increased when compared to last month when patient's creatinine was 1.08.CT findings consistent with possible acute cholecystitis - CCS to assess patient. Patient to be admitted to the hospital for multiple issues at a time. Discussed plan for admission with patient and husband who agree to plan of care.    Jamse Mead, PA-C 08/13/14 2352  Virgel Manifold, MD 08/16/14 [phone]

## 2014-08-13 NOTE — Progress Notes (Signed)
Surgery and Hospitalist at bedside.

## 2014-08-13 NOTE — ED Notes (Signed)
Ultrasound at bedside and will delay transport.

## 2014-08-13 NOTE — ED Notes (Signed)
Bed: DU20 Expected date:  Expected time:  Means of arrival:  Comments: EMS-rectal bleeding

## 2014-08-13 NOTE — Progress Notes (Signed)
POC discussion completed at bedside with Posey Pronto, MD.

## 2014-08-13 NOTE — ED Notes (Signed)
Per EMS: pt states she has had rectal bleeding x 3 days at its worst, from dark to bright red. 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 Pt states she did noticed abd pain once last week and it went away. Pt had fall last week and is on coumadin, has bruise to back of neck, No LOC.  Pt has missed last two INR checks. Denies pain at the moment. 20 g left AC.

## 2014-08-13 NOTE — Consult Note (Signed)
Reason for Consult:rule out cholecystitis Referring Physician: Dr. Meredeth Ide is an 81 y.o. female.  HPI: The pt is a 81yo wf who presents tonight after a fall at home. She states that she has been very weak over the last few days. She also notes terrible upper abdominal pain this past Thursday. Pain is not as bad now. She does not complain of nausea or vomiting. CT shows some inflammatory change around gallbladder. She also has pancreatitis by enzymes with elevated lft's. She also has renal insufficiency, congestive heart failure, and inr of 4.3  Past Medical History  Diagnosis Date  . Hypertension   . Hyperlipidemia   . Heart murmur   . GERD (gastroesophageal reflux disease)   . Joint pain     left knee  . Dizziness   . Chest tightness 02/09/2008  . Hiatal hernia   . CHF (congestive heart failure)   . Angina   . Coronary artery disease   . Hypertrophic cardiomyopathy     subvalvular gradient 45-mm  . Dysrhythmia     hx of atrial fibrilation  . On home oxygen therapy     "2L at rest; 3L w/activity" (06/24/2012)  . Pacemaker 06/24/2012  . Melanoma of back 1961  . Complication of anesthesia   . Claustrophobia     "severe" (06/24/2012)  . Carotid artery occlusion   . Pneumonia     "lastest was 04/26/2011; multiple times before that" (06/24/2012)  . Chronic bronchitis     "used to get it often" (06/24/2012)  . Shortness of breath     "all the time" (06/24/2012)  . Hypothyroidism   . Stroke 2012    hx of TIA  . Arthritis     "2 fingers" (06/24/2012)  . PAF (paroxysmal atrial fibrillation) 06/24/2012  . Symptomatic bradycardia 06/24/2012  . S/P cardiac pacemaker procedure, insertion [employer] device 06/24/12 06/24/2012  . Carotid artery disease 09/21/2011    40-59% R ICA stenosis; patent L carotid endarterectomy w/minimal restenosis; bilateral vertebral arteries are antegrade  . Cataract     Past Surgical History  Procedure Laterality Date  . 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 Microlaryngoscopy with co2 laser and excision of vocal cord lesion  1983  . Carotid endarterectomy  1990's    "left" (06/24/2012)  . Cardiac catheterization  03/14/2011    Stent CX 40% with nml ffr  . Tee without cardioversion  05/01/2011    Procedure: TRANSESOPHAGEAL ECHOCARDIOGRAM (TEE);  Surgeon: Sanda Klein;  Location: [facility];  Service: Cardiovascular;  Laterality: N/A;  . Cardioversion  05/01/2011    Procedure: CARDIOVERSION;  Surgeon: Dani Gobble Croitoru;  Location: [facility];  Service: Cardiovascular;  Laterality: N/A;  . Cardioversion  06/30/2011    Procedure: CARDIOVERSION;  Surgeon: Sanda Klein, MD;  Location: [facility];  Service: Cardiovascular;  Laterality: N/A;  . Insert / replace / remove pacemaker  06/24/2012    dual chamber (06/24/2012)  . Tonsillectomy  1940's?  . Appendectomy  1940's  . Abdominal hysterectomy  1970's  . Dilation and curettage of uterus  1960's - 1970's    "alot" (06/24/2012)  . Cataract extraction w/ intraocular lens  implant, bilateral  2000's  . Melanoma excision  1961    "off my back" (06/24/2012)  . Microlaryngoscopy with co2 laser and excision of vocal cord lesion    . Transthoracic echocardiogram  05/17/2012    see Procedures tab  . Cardiovascular  stress test  09/13/2007    R/P MV - EF 56%; mild ischemia in basal and mid inferolateral regions; global LV systolic function normal; EKG negative for ischemia; pt experienced chest pain during stress test, resolved spontaneously  . Eye surgery    . Permanent pacemaker insertion N/A 06/24/2012    Procedure: PERMANENT PACEMAKER INSERTION;  Surgeon: Sanda Klein, MD;  Location: [facility];  Service: Cardiovascular;  Laterality: N/A;    Family History  Problem Relation Age of Onset  . Heart disease Mother   . Stroke Mother   . Other Father     aneurysm  . Coronary artery disease Other   . Stroke Other     Social History:  reports that she quit smoking  about 25 years ago. Her smoking use included Cigarettes. She has a 35 pack-year smoking history. She has never used smokeless tobacco. She reports that she drinks alcohol. She reports that she does not use illicit drugs.  Allergies:  Allergies  Allergen Reactions  . Metoprolol Other (See Comments)    Bradycardia  . Pseudoephedrine Other (See Comments)    Insomnia (took 1 tablet and could not sleep for 3 days)  . Statins Other (See Comments)    myalgia  . Codeine Nausea Only and Other (See Comments)    dizziness  . Seldane [Terfenadine] Nausea Only and Other (See Comments)    dizziness    Medications: I have reviewed the patient's current medications.  Results for orders placed or performed during the hospital encounter of 08/13/14 (from the past 48 hour(s))  CBC with Differential/Platelet     Status: Abnormal   Collection Time: 08/13/14  5:33 PM  Result Value Ref Range   WBC 8.2 4.0 - 10.5 K/uL   RBC 4.07 3.87 - 5.11 MIL/uL   Hemoglobin 12.4 12.0 - 15.0 g/dL   HCT 39.0 36.0 - 46.0 %   MCV 95.8 78.0 - 100.0 fL   MCH 30.5 26.0 - 34.0 pg   MCHC 31.8 30.0 - 36.0 g/dL   RDW 15.9 (H) 11.5 - 15.5 %   Platelets 221 150 - 400 K/uL   Neutrophils Relative % 83 (H) 43 - 77 %   Neutro Abs 6.9 1.7 - 7.7 K/uL   Lymphocytes Relative 6 (L) 12 - 46 %   Lymphs Abs 0.5 (L) 0.7 - 4.0 K/uL   Monocytes Relative 9 3 - 12 %   Monocytes Absolute 0.7 0.1 - 1.0 K/uL   Eosinophils Relative 2 0 - 5 %   Eosinophils Absolute 0.1 0.0 - 0.7 K/uL   Basophils Relative 0 0 - 1 %   Basophils Absolute 0.0 0.0 - 0.1 K/uL  Comprehensive metabolic panel     Status: Abnormal   Collection Time: 08/13/14  5:33 PM  Result Value Ref Range   Sodium 137 135 - 145 mmol/L   Potassium 4.4 3.5 - 5.1 mmol/L   Chloride 99 96 - 112 mmol/L   CO2 29 19 - 32 mmol/L   Glucose, Bld 86 70 - 99 mg/dL   BUN 63 (H) 6 - 23 mg/dL   Creatinine, Ser 2.98 (H) 0.50 - 1.10 mg/dL   Calcium 8.4 8.4 - 10.5 mg/dL   Total Protein 7.1 6.0 -  8.3 g/dL   Albumin 3.1 (L) 3.5 - 5.2 g/dL   AST 182 (H) 0 - 37 U/L   ALT 199 (H) 0 - 35 U/L   Alkaline Phosphatase 207 (H) 39 - 117 U/L   Total  Bilirubin 1.7 (H) 0.3 - 1.2 mg/dL   GFR calc non Af Amer 13 (L) >90 mL/min   GFR calc Af Amer 16 (L) >90 mL/min    Comment: (NOTE) The eGFR has been calculated using the CKD EPI equation. This calculation has not been validated in all clinical situations. eGFR's persistently <90 mL/min signify possible Chronic Kidney Disease.    Anion gap 9 5 - 15  Lipase, blood     Status: Abnormal   Collection Time: 08/13/14  5:33 PM  Result Value Ref Range   Lipase 606 (H) 11 - 59 U/L    Comment: RESULTS CONFIRMED BY MANUAL DILUTION  Protime-INR     Status: Abnormal   Collection Time: 08/13/14  5:33 PM  Result Value Ref Range   Prothrombin Time 41.6 (H) 11.6 - 15.2 seconds   INR 4.30 (H) 0.00 - 1.49  Troponin I     Status: Abnormal   Collection Time: 08/13/14  5:33 PM  Result Value Ref Range   Troponin I 0.05 (H) <0.031 ng/mL    Comment:        PERSISTENTLY INCREASED TROPONIN VALUES IN THE RANGE OF 0.04-0.49 ng/mL CAN BE SEEN IN:       -UNSTABLE ANGINA       -CONGESTIVE HEART FAILURE       -MYOCARDITIS       -CHEST TRAUMA       -ARRYHTHMIAS       -LATE PRESENTING MYOCARDIAL INFARCTION       -COPD   CLINICAL FOLLOW-UP RECOMMENDED.   Brain natriuretic peptide     Status: Abnormal   Collection Time: 08/13/14  5:33 PM  Result Value Ref Range   B Natriuretic Peptide 1326.6 (H) 0.0 - 100.0 pg/mL  Type and screen for Red Blood Exchange     Status: None   Collection Time: 08/13/14  5:34 PM  Result Value Ref Range   ABO/RH(D) A POS    Antibody Screen NEG    Sample Expiration 08/16/2014   ABO/Rh     Status: None   Collection Time: 08/13/14  5:34 PM  Result Value Ref Range   ABO/RH(D) A POS   POC occult blood, ED     Status: Abnormal   Collection Time: 08/13/14  6:09 PM  Result Value Ref Range   Fecal Occult Bld POSITIVE (A) NEGATIVE   Urinalysis, Routine w reflex microscopic     Status: Abnormal   Collection Time: 08/13/14  7:22 PM  Result Value Ref Range   Color, Urine YELLOW YELLOW   APPearance CLOUDY (A) CLEAR   Specific Gravity, Urine 1.021 1.005 - 1.030   pH 5.0 5.0 - 8.0   Glucose, UA NEGATIVE NEGATIVE mg/dL   Hgb urine dipstick SMALL (A) NEGATIVE   Bilirubin Urine NEGATIVE NEGATIVE   Ketones, ur NEGATIVE NEGATIVE mg/dL   Protein, ur 30 (A) NEGATIVE mg/dL   Urobilinogen, UA 1.0 0.0 - 1.0 mg/dL   Nitrite NEGATIVE NEGATIVE   Leukocytes, UA SMALL (A) NEGATIVE  Urine microscopic-add on     Status: Abnormal   Collection Time: 08/13/14  7:22 PM  Result Value Ref Range   Squamous Epithelial / LPF RARE RARE   WBC, UA 3-6 <3 WBC/hpf   RBC / HPF 3-6 <3 RBC/hpf   Bacteria, UA MANY (A) RARE    Ct Abdomen Pelvis Wo Contrast  08/13/2014   CLINICAL DATA:  Bleeding.  No other indication provided.  EXAM: CT ABDOMEN AND PELVIS WITHOUT CONTRAST  TECHNIQUE:  Multidetector CT imaging of the abdomen and pelvis was performed following the standard protocol without IV contrast.  COMPARISON:  08/10/2011  FINDINGS: Mild dependent changes in the lung bases. Diffuse cardiac enlargement. Postoperative changes in the mediastinum. Calcification in the mitral valve annulus.  Evaluation of solid organs and vascular structures is limited without IV contrast material. Circumscribed low-attenuation lesion in segment 4 of the liver measuring 2.3 x 3.6 cm. This is unchanged since prior study and probably represents a cyst. Appearance of the gallbladder is nonspecific. There is is suggestion of mild gallbladder wall thickening and infiltration. Single tiny stone identified. Early changes of cholecystitis not excluded. Unenhanced appearance of the pancreas, spleen, kidneys, adrenal glands, and inferior vena cava are unremarkable. Calcification of the abdominal aorta without aneurysm. Low-attenuation lymph node between the aorta and IVC measures 12 mm in  is likely reactive. Stomach, small bowel, and colon are decompressed. Scattered diverticula in the colon. No free air or free fluid in the abdomen. No abnormal retroperitoneal fluid collections.  Pelvis: Diverticulosis of the sigmoid colon without inflammatory change. Uterus appears surgically absent. No pelvic mass or lymphadenopathy. No free or loculated pelvic fluid collections. Appendix is not identified. Bladder wall is not thickened. Small left inguinal hernia containing fat. Degenerative changes in the spine and hips.  IMPRESSION: Possible inflammatory changes around the gallbladder. Correlate clinically for suspicion of cholecystitis. Cyst in the liver. No acute process otherwise identified.   Electronically Signed   By: Lucienne Capers M.D.   On: 08/13/2014 20:24   Ct Head Wo Contrast  08/13/2014   CLINICAL DATA:  Rectal bleeding. Anti coagulation. Bruising along the back of the neck.  EXAM: CT HEAD WITHOUT CONTRAST  CT CERVICAL SPINE WITHOUT CONTRAST  TECHNIQUE: Multidetector CT imaging of the head and cervical spine was performed following the standard protocol without intravenous contrast. Multiplanar CT image reconstructions of the cervical spine were also generated.  COMPARISON:  10/14/2009  FINDINGS: CT HEAD FINDINGS  The brainstem, cerebellum, cerebral peduncles, thalamus, basal ganglia, basilar cisterns, and ventricular system appear within normal limits. Periventricular white matter and corona radiata hypodensities favor chronic ischemic microvascular white matter disease. No intracranial hemorrhage, mass lesion, or acute CVA.  Mild chronic right sphenoid sinusitis. Right mastoid effusion. There is atherosclerotic calcification of the cavernous carotid arteries bilaterally.  CT CERVICAL SPINE FINDINGS  No cervical spine fracture or significant abnormal subluxation. No prevertebral soft tissue swelling or significant bony lesion observed. Clips are present in the left neck. No cervical spine   Clips are present in the left neck I do not observe a significant hematoma in the visualized portion of the neck, although please note that the entirety of the neck is not included. The spinous processes appear unremarkable.  IMPRESSION: 1. No acute intracranial findings or acute cervical spine findings. 2. Periventricular white matter and corona radiata hypodensities favor chronic ischemic microvascular white matter disease. 3. Right mastoid effusion with mild chronic right sphenoid sinusitis. 4. No significant hematoma is seen in the visualized portion of the the neck.   Electronically Signed   By: Van Clines M.D.   On: 08/13/2014 20:22   Ct Cervical Spine Wo Contrast  08/13/2014   CLINICAL DATA:  Rectal bleeding. Anti coagulation. Bruising along the back of the neck.  EXAM: CT HEAD WITHOUT CONTRAST  CT CERVICAL SPINE WITHOUT CONTRAST  TECHNIQUE: Multidetector CT imaging of the head and cervical spine was performed following the standard protocol without intravenous contrast. Multiplanar CT image reconstructions of the  cervical spine were also generated.  COMPARISON:  10/14/2009  FINDINGS: CT HEAD FINDINGS  The brainstem, cerebellum, cerebral peduncles, thalamus, basal ganglia, basilar cisterns, and ventricular system appear within normal limits. Periventricular white matter and corona radiata hypodensities favor chronic ischemic microvascular white matter disease. No intracranial hemorrhage, mass lesion, or acute CVA.  Mild chronic right sphenoid sinusitis. Right mastoid effusion. There is atherosclerotic calcification of the cavernous carotid arteries bilaterally.  CT CERVICAL SPINE FINDINGS  No cervical spine fracture or significant abnormal subluxation. No prevertebral soft tissue swelling or significant bony lesion observed. Clips are present in the left neck. No cervical spine  Clips are present in the left neck I do not observe a significant hematoma in the visualized portion of the neck,  although please note that the entirety of the neck is not included. The spinous processes appear unremarkable.  IMPRESSION: 1. No acute intracranial findings or acute cervical spine findings. 2. Periventricular white matter and corona radiata hypodensities favor chronic ischemic microvascular white matter disease. 3. Right mastoid effusion with mild chronic right sphenoid sinusitis. 4. No significant hematoma is seen in the visualized portion of the the neck.   Electronically Signed   By: Van Clines M.D.   On: 08/13/2014 20:22   Dg Chest Port 1 View  08/13/2014   CLINICAL DATA:  Rectal bleeding. Dark colored urine for 3 days. Fall 10 days ago. Back pain.  EXAM: PORTABLE CHEST - 1 VIEW  COMPARISON:  06/25/2012  FINDINGS: Moderate enlargement of the cardiopericardial silhouette observed with indistinct pulmonary vasculature, cephalization of blood flow, and bilateral interstitial accentuation in the lungs. Dual lead pacer remains in place. Prior median sternotomy.  Atherosclerotic calcification of the aortic arch. Bandlike opacities in the retrocardiac region likely reflecting left lower lobe atelectasis. Prior blunting of the costophrenic angles appears improved although this may be positional. The overall degree of interstitial edema appears mildly increased.  IMPRESSION: 1. Moderate enlargement of the cardiopericardial silhouette with interstitial edema. Reduced conspicuity of prior pleural effusions. 2. Suspected bandlike atelectasis at the left lung base. 3. Dual lead pacer remains in place.   Electronically Signed   By: Van Clines M.D.   On: 08/13/2014 20:33   US Abdomen Limited Ruq  08/13/2014   CLINICAL DATA:  Abdominal pain. Recent CT with questionable inflammatory change about the gallbladder.  EXAM: US ABDOMEN LIMITED - RIGHT UPPER QUADRANT  COMPARISON:  CT abdomen/ pelvis earlier this day.  FINDINGS: Gallbladder:  Elongated spanning 11.2 cm. Contains dependent sludge and small  echogenic stones. Normal gallbladder wall thickness of 2 mm. No pericholecystic fluid. No sonographic Murphy sign noted.  Common bile duct:  Diameter: 5 mm.  Liver:  Two hepatic cysts again seen, largest measures 3.5 x 2.1 x 2.9 cm. Otherwise within normal limits in parenchymal echogenicity.  IMPRESSION: 1. Distended gallbladder containing dependent sludge and small stones. There are no secondary sonographic findings of acute cholecystitis. Wall thickness is normal, there is a negative sonographic Murphy sign. No biliary dilatation. 2. Hepatic cysts.   Electronically Signed   By: Jeb Levering M.D.   On: 08/13/2014 22:49    Review of Systems  Constitutional: Positive for malaise/fatigue.  HENT: Negative.   Eyes: Negative.   Respiratory: Positive for shortness of breath.   Cardiovascular: Negative.   Gastrointestinal: Positive for abdominal pain. Negative for nausea and vomiting.  Genitourinary: Negative.   Musculoskeletal: Positive for falls.  Skin: Negative.   Neurological: Negative.   Endo/Heme/Allergies: Bruises/bleeds easily.  Psychiatric/Behavioral: Negative.  Blood pressure 143/56, pulse 59, temperature 97.5 F (36.4 C), temperature source Oral, resp. rate 20, height $RemoveBe'5\' 3"'[id number]$  (1.6 m), weight 183 lb 13.8 oz (83.4 kg), SpO2 99 %. Physical Exam  Constitutional: She is oriented to person, place, and time. She appears well-developed and well-nourished.  HENT:  Head: Normocephalic and atraumatic.  Eyes: Conjunctivae and EOM are normal. Pupils are equal, round, and reactive to light.  Neck: Normal range of motion. Neck supple.  Cardiovascular: Normal rate, regular rhythm and normal heart sounds.   Respiratory: Effort normal and breath sounds normal.  pacemaker  GI: Soft. Bowel sounds are normal.  Mild upper abdominal tenderness. No guarding.  Musculoskeletal: Normal range of motion.  Neurological: She is alert and oriented to person, place, and time.  Skin: Skin is warm and dry.   Psychiatric: She has a normal mood and affect. Her behavior is normal.    Assessment/Plan: The pt may have cholecystitis but she has a very complicated medical condition with several acute problems. At this point I would recommend a HIDA scan to rule out or in cholecystitis. She will need a Cardiology and GI consult. She will need correction of her coags. We will follow closely with you.  TOTH III,Khyli Swaim S 08/13/2014, 11:11 PM

## 2014-08-13 NOTE — ED Notes (Signed)
Pt in CT.

## 2014-08-13 NOTE — ED Notes (Signed)
Patient stated that she can not give sample at this time. Patient will cal out when ready.

## 2014-08-14 ENCOUNTER — Encounter: Payer: Medicare HMO | Admitting: Cardiovascular Disease

## 2014-08-14 ENCOUNTER — Inpatient Hospital Stay (HOSPITAL_COMMUNITY): Payer: PPO

## 2014-08-14 DIAGNOSIS — K850 Idiopathic acute pancreatitis: Secondary | ICD-10-CM

## 2014-08-14 DIAGNOSIS — I509 Heart failure, unspecified: Secondary | ICD-10-CM

## 2014-08-14 DIAGNOSIS — I421 Obstructive hypertrophic cardiomyopathy: Secondary | ICD-10-CM

## 2014-08-14 DIAGNOSIS — Z7901 Long term (current) use of anticoagulants: Secondary | ICD-10-CM

## 2014-08-14 LAB — DIFFERENTIAL
Basophils Absolute: 0 10*3/uL (ref 0.0–0.1)
Basophils Relative: 0 % (ref 0–1)
EOS ABS: 0.1 10*3/uL (ref 0.0–0.7)
EOS PCT: 2 % (ref 0–5)
Lymphocytes Relative: 8 % — ABNORMAL LOW (ref 12–46)
Lymphs Abs: 0.6 10*3/uL — ABNORMAL LOW (ref 0.7–4.0)
MONO ABS: 0.8 10*3/uL (ref 0.1–1.0)
Monocytes Relative: 11 % (ref 3–12)
Neutro Abs: 5.9 10*3/uL (ref 1.7–7.7)
Neutrophils Relative %: 79 % — ABNORMAL HIGH (ref 43–77)

## 2014-08-14 LAB — COMPREHENSIVE METABOLIC PANEL
ALT: 147 U/L — AB (ref 0–35)
ANION GAP: 8 (ref 5–15)
AST: 110 U/L — ABNORMAL HIGH (ref 0–37)
Albumin: 2.5 g/dL — ABNORMAL LOW (ref 3.5–5.2)
Alkaline Phosphatase: 172 U/L — ABNORMAL HIGH (ref 39–117)
BUN: 59 mg/dL — AB (ref 6–23)
CALCIUM: 8.1 mg/dL — AB (ref 8.4–10.5)
CO2: 29 mmol/L (ref 19–32)
Chloride: 99 mmol/L (ref 96–112)
Creatinine, Ser: 2.66 mg/dL — ABNORMAL HIGH (ref 0.50–1.10)
GFR calc Af Amer: 18 mL/min — ABNORMAL LOW (ref >90)
GFR calc non Af Amer: 15 mL/min — ABNORMAL LOW (ref >90)
Glucose, Bld: 63 mg/dL — ABNORMAL LOW (ref 70–99)
Potassium: 4.1 mmol/L (ref 3.5–5.1)
Sodium: 136 mmol/L (ref 135–145)
TOTAL PROTEIN: 6.1 g/dL (ref 6.0–8.3)
Total Bilirubin: 1.5 mg/dL — ABNORMAL HIGH (ref 0.3–1.2)

## 2014-08-14 LAB — RETICULOCYTES
RBC.: 4.02 MIL/uL (ref 3.87–5.11)
RETIC COUNT ABSOLUTE: 48.2 10*3/uL (ref 19.0–186.0)
RETIC CT PCT: 1.2 % (ref 0.4–3.1)

## 2014-08-14 LAB — CBC
HCT: 38.5 % (ref 36.0–46.0)
Hemoglobin: 12.1 g/dL (ref 12.0–15.0)
MCH: 30.1 pg (ref 26.0–34.0)
MCHC: 31.4 g/dL (ref 30.0–36.0)
MCV: 95.8 fL (ref 78.0–100.0)
Platelets: 217 10*3/uL (ref 150–400)
RBC: 4.02 MIL/uL (ref 3.87–5.11)
RDW: 15.9 % — AB (ref 11.5–15.5)
WBC: 7.9 10*3/uL (ref 4.0–10.5)

## 2014-08-14 LAB — PROTIME-INR
INR: 3.52 — ABNORMAL HIGH (ref 0.00–1.49)
Prothrombin Time: 35.5 seconds — ABNORMAL HIGH (ref 11.6–15.2)

## 2014-08-14 LAB — TROPONIN I
Troponin I: 0.04 ng/mL — ABNORMAL HIGH (ref <0.031)
Troponin I: 0.03 ng/mL (ref <0.031)
Troponin I: 0.03 ng/mL (ref <0.031)

## 2014-08-14 LAB — LIPASE, BLOOD: Lipase: 307 U/L — ABNORMAL HIGH (ref 11–59)

## 2014-08-14 MED ORDER — WARFARIN - PHARMACIST DOSING INPATIENT: Freq: Every day | Status: DC

## 2014-08-14 MED ORDER — HYDROCORTISONE ACETATE 25 MG RE SUPP
25.0000 mg | Freq: Two times a day (BID) | RECTAL | Status: DC
  Administered 2014-08-14 – 2014-08-20 (×13): 25 mg via RECTAL
  Filled 2014-08-14 (×18): qty 1

## 2014-08-14 MED ORDER — TECHNETIUM TC 99M MEBROFENIN IV KIT
5.2000 | PACK | Freq: Once | INTRAVENOUS | Status: AC | PRN
  Administered 2014-08-14: 5 via INTRAVENOUS

## 2014-08-14 NOTE — Consult Note (Signed)
CONSULT NOTE  Date: 08/14/2014               Patient Name:  Carla Black MRN: [id number]  DOB: 1928/03/08 Age / Sex: 81 y.o., female        PCP: Leandrew Koyanagi Primary Cardiologist: Croitoru            Referring Physician: Cruzita Lederer              Reason for Consult: CHF in the setting of acute pancreatitis.            History of Present Illness: Patient is a 81 y.o. female with a PMHx of hypertrophic obstructive cardio myopathy, severe mitral regurgitation, who was admitted to [facility] on 08/13/2014 for evaluation of nausea and vomiting and other symptoms of pancreatitis.  The patient has been followed by Dr Sallyanne Kuster.  She was last seen in the office by Tarri Fuller on 02/14/14.  She has a pacemaker. She has a history of dyslipidemia and hypertension. She presented to the hospital with complaints of abdominal pain and some unsteadiness.  She has had a poor PO intake for months.   Has chronic DOE. No CP  .  Has to stop once or twice every time she walks from her room to the dining room.    Medications: Outpatient medications: Prescriptions prior to admission  Medication Sig Dispense Refill Last Dose  . acetaminophen (TYLENOL) 325 MG tablet Take 1-2 tablets (325-650 mg total) by mouth every 4 (four) hours as needed.   unknown at Unknown time  . warfarin (COUMADIN) 2.5 MG tablet Take 1-1.5 tablets by mouth daily as directed by coumadin clinic (Patient taking differently: Take 1.25-2.5 mg by mouth daily. Take 1 tablet (2.5 mg) by mouth daily except on Mondays Take 1/2 tablet (1.25 mg) by mouth daily.) 135 tablet 2 08/12/2014 at 2000  . amoxicillin (AMOXIL) 875 MG tablet Take 1 tablet (875 mg total) by mouth 2 (two) times daily. (Patient not taking: Reported on 07/04/2014) 20 tablet 0 Completed Course at Unknown time  . aspirin 81 MG tablet Take 81 mg by mouth daily.    unknown at unknown time  . diltiazem (CARDIZEM CD) 180 MG 24 hr capsule Take 1 capsule (180 mg total) by mouth  every evening. 90 capsule 2 Taking  . ferrous sulfate 325 (65 FE) MG tablet Take 1 tablet (325 mg total) by mouth daily with breakfast. 30 tablet 6 Taking  . fexofenadine (ALLEGRA) 180 MG tablet Take 180 mg by mouth daily as needed for allergies or rhinitis.   unknown at unknown time  . FLUARIX QUADRIVALENT 0.5 ML injection   0 unknown at unknown time  . fluticasone (FLONASE) 50 MCG/ACT nasal spray Place 2 sprays into both nostrils daily. Bid for 1 week then qd 1 month 15.8 g 2 Taking  . folic acid (FOLVITE) 1 MG tablet Take 1 tablet (1 mg total) by mouth daily. 30 tablet 6 Taking  . furosemide (LASIX) 40 MG tablet Take 1 tablet (40 mg total) by mouth daily. Take one tablet daily until weight is below 190, then one tablet 3 times a week (Mon/Wed/Fri. 90 tablet 3 Taking  . guaiFENesin (MUCINEX) 600 MG 12 hr tablet Take 1,200 mg by mouth 2 (two) times daily.   unknown at unknown time  . levalbuterol (XOPENEX) 0.31 MG/3ML nebulizer solution Take 1 ampule by nebulization 3 (three) times daily.   unknown at unknown time  . levothyroxine (SYNTHROID, LEVOTHROID) 100  MCG tablet Take one tablet by mouth once daily before breakfast 90 tablet 1   . meclizine (ANTIVERT) 25 MG tablet Take 1 tablet (25 mg total) by mouth at bedtime. 30 tablet 0 Taking  . metoprolol (LOPRESSOR) 100 MG tablet Take 1.5 tablets (150 mg total) by mouth 2 (two) times daily. 270 tablet 3 Taking  . mometasone (NASONEX) 50 MCG/ACT nasal spray Place 2 sprays into the nose 2 (two) times daily.   unknown at unknown time  . Multiple Vitamins-Minerals (ICAPS AREDS FORMULA PO) Take 1 capsule by mouth 2 (two) times daily. PER PATIENT THIS IS ICAPS AREDS #2   unknown at unknown time  . Omega 3-6-9 Fatty Acids (OMEGA 3-6-9 COMPLEX PO) Take 1 capsule by mouth 2 (two) times daily.    unknown at unknown time  . PACERONE 200 MG tablet TAKE ONE TABLET BY MOUTH ONCE DAILY 90 tablet 0   . potassium chloride SA (K-DUR,KLOR-CON) 20 MEQ tablet Take 0.5  tablets (10 mEq total) by mouth daily. When taking furosemide 90 tablet 3 unknown at unknown time  . PROAIR HFA 108 (90 BASE) MCG/ACT inhaler INHALE TWO PUFFS BY MOUTH EVERY 6 HOURS AS NEEDED 9 each 0 unknown at unknown time  . Probiotic Product (ALIGN PO) Take 1 capsule by mouth every morning.    unknown at unknown time  . quinapril (ACCUPRIL) 20 MG tablet TAKE ONE TABLET BY MOUTH ONCE DAILY 90 tablet 3 Taking  . triamcinolone (NASACORT AQ) 55 MCG/ACT AERO nasal inhaler 1 spray each nostril twice a day 1 Inhaler 12 unknown at unknown time    Current medications: Current Facility-Administered Medications  Medication Dose Route Frequency Provider Last Rate Last Dose  . amiodarone (PACERONE) tablet 200 mg  200 mg Oral Daily Berle Mull, MD      . antiseptic oral rinse (CPC / CETYLPYRIDINIUM CHLORIDE 0.05%) solution 7 mL  7 mL Mouth Rinse q12n4p Berle Mull, MD      . aspirin EC tablet 81 mg  81 mg Oral Daily Berle Mull, MD      . chlorhexidine (PERIDEX) 0.12 % solution 15 mL  15 mL Mouth Rinse BID Berle Mull, MD   15 mL at 08/14/14 0747  . diltiazem (CARDIZEM CD) 24 hr capsule 180 mg  180 mg Oral QPM Berle Mull, MD      . ferrous sulfate tablet 325 mg  325 mg Oral Q breakfast Berle Mull, MD   325 mg at 08/14/14 0800  . fluticasone (FLONASE) 50 MCG/ACT nasal spray 2 spray  2 spray Each Nare Daily Berle Mull, MD      . folic acid (FOLVITE) tablet 1 mg  1 mg Oral Daily Berle Mull, MD      . levalbuterol Penne Lash) nebulizer solution 0.315 mg  0.315 mg Nebulization TID Berle Mull, MD   0.315 mg at 08/14/14 0940  . levothyroxine (SYNTHROID, LEVOTHROID) tablet 100 mcg  100 mcg Oral QAC breakfast Berle Mull, MD   100 mcg at 08/14/14 0747  . loratadine (CLARITIN) tablet 10 mg  10 mg Oral Daily Berle Mull, MD      . meclizine (ANTIVERT) tablet 25 mg  25 mg Oral QHS Berle Mull, MD   25 mg at 08/14/14 0023  . metoprolol (LOPRESSOR) tablet 150 mg  150 mg Oral BID Berle Mull, MD      .  morphine 2 MG/ML injection 2 mg  2 mg Intravenous Q4H PRN Berle Mull, MD      . ondansetron (  ZOFRAN) tablet 4 mg  4 mg Oral Q6H PRN Berle Mull, MD       Or  . ondansetron (ZOFRAN) injection 4 mg  4 mg Intravenous Q6H PRN Berle Mull, MD      . oxyCODONE (Oxy IR/ROXICODONE) immediate release tablet 5 mg  5 mg Oral Q4H PRN Berle Mull, MD      . sodium chloride 0.9 % injection 3 mL  3 mL Intravenous Q12H Berle Mull, MD   3 mL at 08/14/14 0000     Allergies  Allergen Reactions  . Metoprolol Other (See Comments)    Bradycardia  . Pseudoephedrine Other (See Comments)    Insomnia (took 1 tablet and could not sleep for 3 days)  . Statins Other (See Comments)    myalgia  . Codeine Nausea Only and Other (See Comments)    dizziness  . Seldane [Terfenadine] Nausea Only and Other (See Comments)    dizziness     Past Medical History  Diagnosis Date  . Hypertension   . Hyperlipidemia   . Heart murmur   . GERD (gastroesophageal reflux disease)   . Joint pain     left knee  . Dizziness   . Chest tightness 02/09/2008  . Hiatal hernia   . CHF (congestive heart failure)   . Angina   . Coronary artery disease   . Hypertrophic cardiomyopathy     subvalvular gradient 45-mm  . Dysrhythmia     hx of atrial fibrilation  . On home oxygen therapy     "2L at rest; 3L w/activity" (06/24/2012)  . Pacemaker 06/24/2012  . Melanoma of back 1961  . Complication of anesthesia   . Claustrophobia     "severe" (06/24/2012)  . Carotid artery occlusion   . Pneumonia     "lastest was 04/26/2011; multiple times before that" (06/24/2012)  . Chronic bronchitis     "used to get it often" (06/24/2012)  . Shortness of breath     "all the time" (06/24/2012)  . Hypothyroidism   . Stroke 2012    hx of TIA  . Arthritis     "2 fingers" (06/24/2012)  . PAF (paroxysmal atrial fibrillation) 06/24/2012  . Symptomatic bradycardia 06/24/2012  . S/P cardiac pacemaker procedure, insertion Medtronic device 06/24/12 06/24/2012  .  Carotid artery disease 09/21/2011    40-59% R ICA stenosis; patent L carotid endarterectomy w/minimal restenosis; bilateral vertebral arteries are antegrade  . Cataract     Past Surgical History  Procedure Laterality Date  . 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 Microlaryngoscopy with co2 laser and excision of vocal cord lesion  1983  . Carotid endarterectomy  1990's    "left" (06/24/2012)  . Cardiac catheterization  03/14/2011    Stent CX 40% with nml ffr  . Tee without cardioversion  05/01/2011    Procedure: TRANSESOPHAGEAL ECHOCARDIOGRAM (TEE);  Surgeon: Sanda Klein;  Location: [place];  Service: Cardiovascular;  Laterality: N/A;  . Cardioversion  05/01/2011    Procedure: CARDIOVERSION;  Surgeon: Dani Gobble Croitoru;  Location: [place];  Service: Cardiovascular;  Laterality: N/A;  . Cardioversion  06/30/2011    Procedure: CARDIOVERSION;  Surgeon: Sanda Klein, MD;  Location: [place];  Service: Cardiovascular;  Laterality: N/A;  . Insert / replace / remove pacemaker  06/24/2012    dual chamber (06/24/2012)  . Tonsillectomy  1940's?  . Appendectomy  1940's  . Abdominal hysterectomy  1970's  . Dilation and  curettage of uterus  1960's - 1970's    "alot" (06/24/2012)  . Cataract extraction w/ intraocular lens  implant, bilateral  2000's  . Melanoma excision  1961    "off my back" (06/24/2012)  . Microlaryngoscopy with co2 laser and excision of vocal cord lesion    . Transthoracic echocardiogram  05/17/2012    see Procedures tab  . Cardiovascular stress test  09/13/2007    R/P MV - EF 56%; mild ischemia in basal and mid inferolateral regions; global LV systolic function normal; EKG negative for ischemia; pt experienced chest pain during stress test, resolved spontaneously  . Eye surgery    . Permanent pacemaker insertion N/A 06/24/2012    Procedure: PERMANENT PACEMAKER INSERTION;  Surgeon: Sanda Klein, MD;  Location: [place] CATH [facility];  Service:  Cardiovascular;  Laterality: N/A;    Family History  Problem Relation Age of Onset  . Heart disease Mother   . Stroke Mother   . Other Father     aneurysm  . Coronary artery disease Other   . Stroke Other     Social History:  reports that she quit smoking about 25 years ago. Her smoking use included Cigarettes. She has a 35 pack-year smoking history. She has never used smokeless tobacco. She reports that she drinks alcohol. She reports that she does not use illicit drugs.   Review of Systems: Constitutional:  admits to  appetite change and fatigue.  HEENT: denies photophobia, eye pain, redness, hearing loss, ear pain, congestion, sore throat, rhinorrhea, sneezing, neck pain, neck stiffness and tinnitus.  Respiratory: admits to SOB, DOE,    Cardiovascular: denies chest pain, palpitations .  Has chronic leg swelling.  Gastrointestinal: denies nausea, vomiting, abdominal pain, diarrhea, constipation, blood in stool.  Genitourinary: denies dysuria, urgency, frequency, hematuria, flank pain and difficulty urinating.  Musculoskeletal: denies  myalgias, back pain, joint swelling, arthralgias and gait problem.   Skin: denies pallor, rash and wound.  Neurological: denies dizziness, seizures, syncope, weakness, light-headedness, numbness and headaches.   Hematological: denies adenopathy, easy bruising, personal or family bleeding history.  Psychiatric/ Behavioral: denies suicidal ideation, mood changes, confusion, nervousness, sleep disturbance and agitation.    Physical Exam: BP 148/41 mmHg  Pulse 60  Temp(Src) 97.5 F (36.4 C) (Oral)  Resp 18  Ht 5\' 3"  (1.6 m)  Wt 184 lb 1.4 oz (83.5 kg)  BMI 32.62 kg/m2  SpO2 95%  Wt Readings from Last 3 Encounters:  08/14/14 184 lb 1.4 oz (83.5 kg)  07/04/14 181 lb (82.101 kg)  03/19/14 197 lb 3.2 oz (89.449 kg)    General: Vital signs reviewed and noted. Well-developed, well-nourished, in no acute distress; alert,   Head: Normocephalic,  atraumatic, sclera anicteric,   Neck: Supple. Negative for carotid bruits. No JVD   Lungs:  Rales bilaterally   Heart: RRR with S1 S2. 3-8/4 systolic murmur at the left sternal border.,slight radiation to the axilla .   Abdomen/ GI :  Soft, non-tender, non-distended with normoactive bowel sounds. No hepatomegaly. No rebound/guarding. No obvious abdominal masses   MSK: Strength and the appear normal for age.   Extremities: No clubbing or cyanosis. No edema.  Distal pedal pulses are 2+ and equal   Neurologic:  CN are grossly intact,  No obvious motor or sensory defect.  Alert and oriented X 3. Moves all extremities spontaneously.  Psych: Responds to questions appropriately with a normal affect.    Lab results: Basic Metabolic Panel:  Recent Labs Lab 08/13/14 1733 08/14/14 0507  NA 137 136  K 4.4 4.1  CL 99 99  CO2 29 29  GLUCOSE 86 63*  BUN 63* 59*  CREATININE 2.98* 2.66*  CALCIUM 8.4 8.1*    Liver Function Tests:  Recent Labs Lab 08/13/14 1733 08/14/14 0507  AST 182* 110*  ALT 199* 147*  ALKPHOS 207* 172*  BILITOT 1.7* 1.5*  PROT 7.1 6.1  ALBUMIN 3.1* 2.5*    Recent Labs Lab 08/13/14 1733  LIPASE 606*   No results for input(s): AMMONIA in the last 168 hours.  CBC:  Recent Labs Lab 08/13/14 1733 08/14/14 0010  WBC 8.2 7.9  NEUTROABS 6.9 5.9  HGB 12.4 12.1  HCT 39.0 38.5  MCV 95.8 95.8  PLT 221 217    Cardiac Enzymes:  Recent Labs Lab 08/13/14 1733 08/14/14 0010 08/14/14 0507  TROPONINI 0.05* 0.04* 0.03    BNP: Invalid input(s): POCBNP  CBG: No results for input(s): GLUCAP in the last 168 hours.  Coagulation Studies:  Recent Labs  08/13/14 1733  LABPROT 41.6*  INR 4.30*     Other results: Personal review of EKG shows :  - AV sequential pacing at 60  Imaging:         Assessment & Plan:  1. Hypertrophic obstructive cardiomyopathy-  clinically it sounds like she's been bonded pleaded. She's had very poor PO ake for the  past several weeks and she has an acute increase in her creatinine.  Her proBNP is elevated but this is most likely result of her acute renal insufficiency.  She has chronic leg edema so this is not useful in determining her volume status.  She does have rales but she also has chronic respiratory issues.  At this point, I thinks she is likely intravascular depleted.  This will improve as she improves from her pancreatitis and is allowed to eat and drink normally.     2. Mitral regurgitation - she has severe MR at baseline.  Seems to be well compensated at this time.  3.Pancreatitis - plans per GI team   4.  Hypertension- fairly well controlled   5. Chronic leg edema   6. Atrial fib:   - she is currently AV pacing.      Thayer Headings, Brooke Bonito., MD, [facility] 08/14/2014, 10:21 AM Office - [phone] Pager [phone]

## 2014-08-14 NOTE — Progress Notes (Signed)
ANTICOAGULATION CONSULT NOTE - Initial Consult  Pharmacy Consult for warfarin Indication: atrial fibrillation  Allergies  Allergen Reactions  . Metoprolol Other (See Comments)    Bradycardia  . Pseudoephedrine Other (See Comments)    Insomnia (took 1 tablet and could not sleep for 3 days)  . Statins Other (See Comments)    myalgia  . Codeine Nausea Only and Other (See Comments)    dizziness  . Seldane [Terfenadine] Nausea Only and Other (See Comments)    dizziness    Patient Measurements: Height: 5\' 3"  (160 cm) Weight: 184 lb 1.4 oz (83.5 kg) IBW/kg (Calculated) : 52.4   Vital Signs: Temp: 97.5 F (36.4 C) (02/23 0440) Temp Source: Oral (02/23 0440) BP: 148/41 mmHg (02/23 0440) Pulse Rate: 60 (02/23 0440)  Labs:  Recent Labs  08/13/14 1733 08/14/14 0010 08/14/14 0507  HGB 12.4 12.1  --   HCT 39.0 38.5  --   PLT 221 217  --   LABPROT 41.6*  --   --   INR 4.30*  --   --   CREATININE 2.98*  --  2.66*  TROPONINI 0.05* 0.04* 0.03    Estimated Creatinine Clearance: 16.1 mL/min (by C-G formula based on Cr of 2.66).   Medical History: Past Medical History  Diagnosis Date  . Hypertension   . Hyperlipidemia   . Heart murmur   . GERD (gastroesophageal reflux disease)   . Joint pain     left knee  . Dizziness   . Chest tightness 02/09/2008  . Hiatal hernia   . CHF (congestive heart failure)   . Angina   . Coronary artery disease   . Hypertrophic cardiomyopathy     subvalvular gradient 45-mm  . Dysrhythmia     hx of atrial fibrilation  . On home oxygen therapy     "2L at rest; 3L w/activity" (06/24/2012)  . Pacemaker 06/24/2012  . Melanoma of back 1961  . Complication of anesthesia   . Claustrophobia     "severe" (06/24/2012)  . Carotid artery occlusion   . Pneumonia     "lastest was 04/26/2011; multiple times before that" (06/24/2012)  . Chronic bronchitis     "used to get it often" (06/24/2012)  . Shortness of breath     "all the time" (06/24/2012)  .  Hypothyroidism   . Stroke 2012    hx of TIA  . Arthritis     "2 fingers" (06/24/2012)  . PAF (paroxysmal atrial fibrillation) 06/24/2012  . Symptomatic bradycardia 06/24/2012  . S/P cardiac pacemaker procedure, insertion Medtronic device 06/24/12 06/24/2012  . Carotid artery disease 09/21/2011    40-59% R ICA stenosis; patent L carotid endarterectomy w/minimal restenosis; bilateral vertebral arteries are antegrade  . Cataract     Medications:  Scheduled:  . amiodarone  200 mg Oral Daily  . antiseptic oral rinse  7 mL Mouth Rinse q12n4p  . aspirin EC  81 mg Oral Daily  . chlorhexidine  15 mL Mouth Rinse BID  . diltiazem  180 mg Oral QPM  . ferrous sulfate  325 mg Oral Q breakfast  . fluticasone  2 spray Each Nare Daily  . folic acid  1 mg Oral Daily  . hydrocortisone  25 mg Rectal BID  . levalbuterol  0.315 mg Nebulization TID  . levothyroxine  100 mcg Oral QAC breakfast  . loratadine  10 mg Oral Daily  . meclizine  25 mg Oral QHS  . metoprolol  150 mg Oral BID  .  sodium chloride  3 mL Intravenous Q12H    Assessment: 81 y.o. female with a significant cardiac history of chronic coumadin who presented to ED yesterday with acute abdominal pain, weakness and a fall. She has chronic, intermittent loose stools but yesterday had several episodes of diarrhea followed by rectal bleeding. Her lipase was 600, no findings of pancreatitis on non-contrast CTscan. LFTs elevated in a mixed pattern. Ultrasound reveals distended gallbladder with small gallstones, no CBD dilation.  Pharmacy consulted to manage warfarin.  -Per last anti-coag note on 1/29 warfarin dose is 2.5mg  daily except 1.25mg  on Mon and Fri. -pt has had poor appetite for several months -per GI, pt has intermittent rectal bleeding  Today: INR 4.3  Goal of Therapy:  INR 2-3   Plan:  Hold warfarin  Daily INR  Dolly Rias RPh 08/14/2014, 1:17 PM Pager [phone]

## 2014-08-14 NOTE — Progress Notes (Signed)
Subjective: Very nice lady who came to the ER after falling in the BR at [facility] yesterday.  She reports significant pain mid abdomen last week on 08/09/14.  That has gotten better, she has been able to eat, but not very hungry.  Reports she couldn't even finish a small hamburger yesterday.  No nausea or vomiting with PO intake reported.  She has been having black and bloody stools for some time.  She doesn't know what made her fall yesterday.  She was up off the toilet and going out the door when her legs just gave out.  Her husband also reports some ongoing weakness in her legs.  Both are at [facility] and both are on O2 in her room.  Objective: Vital signs in last 24 hours: Temp:  [97.5 F (36.4 C)-97.9 F (36.6 C)] 97.5 F (36.4 C) (02/23 0440) Pulse Rate:  [59-62] 60 (02/23 0440) Resp:  [18-23] 18 (02/23 0440) BP: (139-151)/(41-58) 148/41 mmHg (02/23 0440) SpO2:  [95 %-100 %] 95 % (02/23 0440) Weight:  [83.4 kg (183 lb 13.8 oz)-83.5 kg (184 lb 1.4 oz)] 83.5 kg (184 lb 1.4 oz) (02/23 0332)  300 urine output recorded. NPO Afebrile, VSS  Sats good on Nasal O2 Creatinine/LFT's improving WBC stable with left shift INR last PM 4.30 Fecal occult + for blood 08/13/14  Intake/Output from previous day: 02/22 0701 - 02/23 0700 In: -  Out: 300 [Urine:300] Intake/Output this shift: Total I/O In: -  Out: 200 [Urine:200]  General appearance: alert, cooperative, no distress and some difficulty remembering sequence of events.  Resp: clear to auscultation bilaterally and anterior GI: soft, not tender except on palpation, pain both RUQ and LLQ on exam.  No pain unless I push.  + BS.  She has a mid line incision below umbulicus, she can't remember what she had. +BS, Having bloody/black BM's. Extremities: edema both lower legs, +1-2.   Cardiac:  Bradycardic regular, AV paced on Telem Buttocks, No bruising,  She is very tender over lower lumbar back.   Skin:  bruising at multiple  sites. Lab Results:   Recent Labs  08/13/14 1733 08/14/14 0010  WBC 8.2 7.9  HGB 12.4 12.1  HCT 39.0 38.5  PLT 221 217    BMET  Recent Labs  08/13/14 1733 08/14/14 0507  NA 137 136  K 4.4 4.1  CL 99 99  CO2 29 29  GLUCOSE 86 63*  BUN 63* 59*  CREATININE 2.98* 2.66*  CALCIUM 8.4 8.1*   PT/INR  Recent Labs  08/13/14 1733  LABPROT 41.6*  INR 4.30*     Recent Labs Lab 08/13/14 1733 08/14/14 0507  AST 182* 110*  ALT 199* 147*  ALKPHOS 207* 172*  BILITOT 1.7* 1.5*  PROT 7.1 6.1  ALBUMIN 3.1* 2.5*     Lipase     Component Value Date/Time   LIPASE 606* 08/13/2014 1733     Studies/Results: Ct Abdomen Pelvis Wo Contrast  08/13/2014   CLINICAL DATA:  Bleeding.  No other indication provided.  EXAM: CT ABDOMEN AND PELVIS WITHOUT CONTRAST  TECHNIQUE: Multidetector CT imaging of the abdomen and pelvis was performed following the standard protocol without IV contrast.  COMPARISON:  08/10/2011  FINDINGS: Mild dependent changes in the lung bases. Diffuse cardiac enlargement. Postoperative changes in the mediastinum. Calcification in the mitral valve annulus.  Evaluation of solid organs and vascular structures is limited without IV contrast material. Circumscribed low-attenuation lesion in segment 4 of the liver measuring 2.3 x  3.6 cm. This is unchanged since prior study and probably represents a cyst. Appearance of the gallbladder is nonspecific. There is is suggestion of mild gallbladder wall thickening and infiltration. Single tiny stone identified. Early changes of cholecystitis not excluded. Unenhanced appearance of the pancreas, spleen, kidneys, adrenal glands, and inferior vena cava are unremarkable. Calcification of the abdominal aorta without aneurysm. Low-attenuation lymph node between the aorta and IVC measures 12 mm in is likely reactive. Stomach, small bowel, and colon are decompressed. Scattered diverticula in the colon. No free air or free fluid in the abdomen.  No abnormal retroperitoneal fluid collections.  Pelvis: Diverticulosis of the sigmoid colon without inflammatory change. Uterus appears surgically absent. No pelvic mass or lymphadenopathy. No free or loculated pelvic fluid collections. Appendix is not identified. Bladder wall is not thickened. Small left inguinal hernia containing fat. Degenerative changes in the spine and hips.  IMPRESSION: Possible inflammatory changes around the gallbladder. Correlate clinically for suspicion of cholecystitis. Cyst in the liver. No acute process otherwise identified.   Electronically Signed   By: Lucienne Capers M.D.   On: 08/13/2014 20:24   Ct Head Wo Contrast  08/13/2014   CLINICAL DATA:  Rectal bleeding. Anti coagulation. Bruising along the back of the neck.  EXAM: CT HEAD WITHOUT CONTRAST  CT CERVICAL SPINE WITHOUT CONTRAST  TECHNIQUE: Multidetector CT imaging of the head and cervical spine was performed following the standard protocol without intravenous contrast. Multiplanar CT image reconstructions of the cervical spine were also generated.  COMPARISON:  10/14/2009  FINDINGS: CT HEAD FINDINGS  The brainstem, cerebellum, cerebral peduncles, thalamus, basal ganglia, basilar cisterns, and ventricular system appear within normal limits. Periventricular white matter and corona radiata hypodensities favor chronic ischemic microvascular white matter disease. No intracranial hemorrhage, mass lesion, or acute CVA.  Mild chronic right sphenoid sinusitis. Right mastoid effusion. There is atherosclerotic calcification of the cavernous carotid arteries bilaterally.  CT CERVICAL SPINE FINDINGS  No cervical spine fracture or significant abnormal subluxation. No prevertebral soft tissue swelling or significant bony lesion observed. Clips are present in the left neck. No cervical spine  Clips are present in the left neck I do not observe a significant hematoma in the visualized portion of the neck, although please note that the  entirety of the neck is not included. The spinous processes appear unremarkable.  IMPRESSION: 1. No acute intracranial findings or acute cervical spine findings. 2. Periventricular white matter and corona radiata hypodensities favor chronic ischemic microvascular white matter disease. 3. Right mastoid effusion with mild chronic right sphenoid sinusitis. 4. No significant hematoma is seen in the visualized portion of the the neck.   Electronically Signed   By: Van Clines M.D.   On: 08/13/2014 20:22   Ct Cervical Spine Wo Contrast  08/13/2014   CLINICAL DATA:  Rectal bleeding. Anti coagulation. Bruising along the back of the neck.  EXAM: CT HEAD WITHOUT CONTRAST  CT CERVICAL SPINE WITHOUT CONTRAST  TECHNIQUE: Multidetector CT imaging of the head and cervical spine was performed following the standard protocol without intravenous contrast. Multiplanar CT image reconstructions of the cervical spine were also generated.  COMPARISON:  10/14/2009  FINDINGS: CT HEAD FINDINGS  The brainstem, cerebellum, cerebral peduncles, thalamus, basal ganglia, basilar cisterns, and ventricular system appear within normal limits. Periventricular white matter and corona radiata hypodensities favor chronic ischemic microvascular white matter disease. No intracranial hemorrhage, mass lesion, or acute CVA.  Mild chronic right sphenoid sinusitis. Right mastoid effusion. There is atherosclerotic calcification of the cavernous  carotid arteries bilaterally.  CT CERVICAL SPINE FINDINGS  No cervical spine fracture or significant abnormal subluxation. No prevertebral soft tissue swelling or significant bony lesion observed. Clips are present in the left neck. No cervical spine  Clips are present in the left neck I do not observe a significant hematoma in the visualized portion of the neck, although please note that the entirety of the neck is not included. The spinous processes appear unremarkable.  IMPRESSION: 1. No acute intracranial  findings or acute cervical spine findings. 2. Periventricular white matter and corona radiata hypodensities favor chronic ischemic microvascular white matter disease. 3. Right mastoid effusion with mild chronic right sphenoid sinusitis. 4. No significant hematoma is seen in the visualized portion of the the neck.   Electronically Signed   By: Van Clines M.D.   On: 08/13/2014 20:22   Dg Chest Port 1 View  08/13/2014   CLINICAL DATA:  Rectal bleeding. Dark colored urine for 3 days. Fall 10 days ago. Back pain.  EXAM: PORTABLE CHEST - 1 VIEW  COMPARISON:  06/25/2012  FINDINGS: Moderate enlargement of the cardiopericardial silhouette observed with indistinct pulmonary vasculature, cephalization of blood flow, and bilateral interstitial accentuation in the lungs. Dual lead pacer remains in place. Prior median sternotomy.  Atherosclerotic calcification of the aortic arch. Bandlike opacities in the retrocardiac region likely reflecting left lower lobe atelectasis. Prior blunting of the costophrenic angles appears improved although this may be positional. The overall degree of interstitial edema appears mildly increased.  IMPRESSION: 1. Moderate enlargement of the cardiopericardial silhouette with interstitial edema. Reduced conspicuity of prior pleural effusions. 2. Suspected bandlike atelectasis at the left lung base. 3. Dual lead pacer remains in place.   Electronically Signed   By: Van Clines M.D.   On: 08/13/2014 20:33   US Abdomen Limited Ruq  08/13/2014   CLINICAL DATA:  Abdominal pain. Recent CT with questionable inflammatory change about the gallbladder.  EXAM: US ABDOMEN LIMITED - RIGHT UPPER QUADRANT  COMPARISON:  CT abdomen/ pelvis earlier this day.  FINDINGS: Gallbladder:  Elongated spanning 11.2 cm. Contains dependent sludge and small echogenic stones. Normal gallbladder wall thickness of 2 mm. No pericholecystic fluid. No sonographic Murphy sign noted.  Common bile duct:  Diameter: 5 mm.   Liver:  Two hepatic cysts again seen, largest measures 3.5 x 2.1 x 2.9 cm. Otherwise within normal limits in parenchymal echogenicity.  IMPRESSION: 1. Distended gallbladder containing dependent sludge and small stones. There are no secondary sonographic findings of acute cholecystitis. Wall thickness is normal, there is a negative sonographic Murphy sign. No biliary dilatation. 2. Hepatic cysts.   Electronically Signed   By: Jeb Levering M.D.   On: 08/13/2014 22:49    Medications: . amiodarone  200 mg Oral Daily  . antiseptic oral rinse  7 mL Mouth Rinse q12n4p  . aspirin EC  81 mg Oral Daily  . chlorhexidine  15 mL Mouth Rinse BID  . diltiazem  180 mg Oral QPM  . ferrous sulfate  325 mg Oral Q breakfast  . fluticasone  2 spray Each Nare Daily  . folic acid  1 mg Oral Daily  . levalbuterol  0.315 mg Nebulization TID  . levothyroxine  100 mcg Oral QAC breakfast  . loratadine  10 mg Oral Daily  . meclizine  25 mg Oral QHS  . metoprolol  150 mg Oral BID  . sodium chloride  3 mL Intravenous Q12H    Assessment/Plan Falling with weakness Abdominal pain Pancreatitis,  vs gallbladder pancreatitis Cholelithiasis LFT's improving Acute renal failure CAD/prior stent/AF/Hx of CHF/PTVP/hypertrophic Cardiomyopathy  Supratheraputic INR On chronic home O2 Hypertension Hx of Stroke and  Left Carotid Endarterectomy, RICA stenosis Hypertension Hyperlipidemia Arthritis  DVT- INR 4.3 last PM   Plan:  We will order a HIDA scan and follow with you, add lipase to current labs.  From her story it sounds like she may have started having pancreatitis last week and we are seeing the results several days later. It was the fall and the weakness that brought her into the hospital.   I think the risk of surgery in her is very high, and we will need to get cardiology involved and possibly GI.      LOS: 1 day    Drexel Ivey 08/14/2014

## 2014-08-14 NOTE — Progress Notes (Signed)
  Echocardiogram 2D Echocardiogram has been performed.  Diamond Nickel 08/14/2014, 3:25 PM

## 2014-08-14 NOTE — Progress Notes (Signed)
PROGRESS NOTE  MELISA DONOFRIO PYK:[id number] DOB: 06-18-1928 DOA: 08/13/2014 PCP: Leandrew Koyanagi, MD  HPI: Carla Black is a 81 y.o. female with Past medical history of hypertension, dyslipidemia, HOCM, chronic combined CHF, chronic hypoxic respiratory failure, pacemaker implant, GERD, A. fib, peripheral vascular disease. The patient presented with complaints of abdominal pain and a fall, found to have acute pancreatitis, cholelithiasis, worsening of her CHF.  Subjective / 24 H Interval events - ongoing abdominal pain, denies chest pain or breathing difficulties   Assessment/Plan: Principal Problem:   Pancreatitis, acute Active Problems:   Abdominal pain   AKI (acute kidney injury)   Elevated LFTs   Acute on chronic combined systolic and diastolic heart failure   Supratherapeutic INR   BRBPR (bright red blood per rectum)   Abnormal gall bladder diagnostic imaging   Acute pancreatitis  Acute pancreatitis / abdominal pain / poor po intake  - possibly related to cholelithiasis - general surgery/GI consulted, appreciate input - unfortunately aggressive hydration is difficult given heart failure - she is not hemoconcentrated  HOCM / Mitral regurgitation / acute on chronic diastolic heart failure - overall difficult to hydrate due to fluid overload, although she is a bit intravascularly depleted. She has pitting LE edema and CXR with cardiomegaly and interstitial edema - repeat 2D echo - cardiology consulted, appreciate input.  A fib  - now pacemaker dependent - INR high, hold coumadin  Supratherapeutic INR  - Coumadin on hold, no evidence of ongoing G Ibleed  Bright red blood per rectum - intermittently, her Hb is stable, ?hemorrhoids per GI  Acute on chronic renal failure - CKD stage III - likely due to dehydration, improved with small bolus in the ED - continue to closely monitor  Elevated LFTs with elevated bilirubin - no CBD dilatation, GI  following  Diet: Diet NPO time specified Except for: Sips with Meds, Ice Chips Fluids: none  DVT Prophylaxis: COumadin  Code Status: Full Code Family Communication: d/w husband bedside  Disposition Plan: inpatient  Consultants:  General Surgery   GI  Cardiology   Procedures:  None    Antibiotics  Anti-infectives    None       Studies  Ct Abdomen Pelvis Wo Contrast  08/13/2014   CLINICAL DATA:  Bleeding.  No other indication provided.  EXAM: CT ABDOMEN AND PELVIS WITHOUT CONTRAST  TECHNIQUE: Multidetector CT imaging of the abdomen and pelvis was performed following the standard protocol without IV contrast.  COMPARISON:  08/10/2011  FINDINGS: Mild dependent changes in the lung bases. Diffuse cardiac enlargement. Postoperative changes in the mediastinum. Calcification in the mitral valve annulus.  Evaluation of solid organs and vascular structures is limited without IV contrast material. Circumscribed low-attenuation lesion in segment 4 of the liver measuring 2.3 x 3.6 cm. This is unchanged since prior study and probably represents a cyst. Appearance of the gallbladder is nonspecific. There is is suggestion of mild gallbladder wall thickening and infiltration. Single tiny stone identified. Early changes of cholecystitis not excluded. Unenhanced appearance of the pancreas, spleen, kidneys, adrenal glands, and inferior vena cava are unremarkable. Calcification of the abdominal aorta without aneurysm. Low-attenuation lymph node between the aorta and IVC measures 12 mm in is likely reactive. Stomach, small bowel, and colon are decompressed. Scattered diverticula in the colon. No free air or free fluid in the abdomen. No abnormal retroperitoneal fluid collections.  Pelvis: Diverticulosis of the sigmoid colon without inflammatory change. Uterus appears surgically absent. No pelvic mass or lymphadenopathy.  No free or loculated pelvic fluid collections. Appendix is not identified. Bladder  wall is not thickened. Small left inguinal hernia containing fat. Degenerative changes in the spine and hips.  IMPRESSION: Possible inflammatory changes around the gallbladder. Correlate clinically for suspicion of cholecystitis. Cyst in the liver. No acute process otherwise identified.   Electronically Signed   By: Lucienne Capers M.D.   On: 08/13/2014 20:24   Ct Head Wo Contrast  08/13/2014   CLINICAL DATA:  Rectal bleeding. Anti coagulation. Bruising along the back of the neck.  EXAM: CT HEAD WITHOUT CONTRAST  CT CERVICAL SPINE WITHOUT CONTRAST  TECHNIQUE: Multidetector CT imaging of the head and cervical spine was performed following the standard protocol without intravenous contrast. Multiplanar CT image reconstructions of the cervical spine were also generated.  COMPARISON:  10/14/2009  FINDINGS: CT HEAD FINDINGS  The brainstem, cerebellum, cerebral peduncles, thalamus, basal ganglia, basilar cisterns, and ventricular system appear within normal limits. Periventricular white matter and corona radiata hypodensities favor chronic ischemic microvascular white matter disease. No intracranial hemorrhage, mass lesion, or acute CVA.  Mild chronic right sphenoid sinusitis. Right mastoid effusion. There is atherosclerotic calcification of the cavernous carotid arteries bilaterally.  CT CERVICAL SPINE FINDINGS  No cervical spine fracture or significant abnormal subluxation. No prevertebral soft tissue swelling or significant bony lesion observed. Clips are present in the left neck. No cervical spine  Clips are present in the left neck I do not observe a significant hematoma in the visualized portion of the neck, although please note that the entirety of the neck is not included. The spinous processes appear unremarkable.  IMPRESSION: 1. No acute intracranial findings or acute cervical spine findings. 2. Periventricular white matter and corona radiata hypodensities favor chronic ischemic microvascular white matter  disease. 3. Right mastoid effusion with mild chronic right sphenoid sinusitis. 4. No significant hematoma is seen in the visualized portion of the the neck.   Electronically Signed   By: Van Clines M.D.   On: 08/13/2014 20:22   Ct Cervical Spine Wo Contrast  08/13/2014   CLINICAL DATA:  Rectal bleeding. Anti coagulation. Bruising along the back of the neck.  EXAM: CT HEAD WITHOUT CONTRAST  CT CERVICAL SPINE WITHOUT CONTRAST  TECHNIQUE: Multidetector CT imaging of the head and cervical spine was performed following the standard protocol without intravenous contrast. Multiplanar CT image reconstructions of the cervical spine were also generated.  COMPARISON:  10/14/2009  FINDINGS: CT HEAD FINDINGS  The brainstem, cerebellum, cerebral peduncles, thalamus, basal ganglia, basilar cisterns, and ventricular system appear within normal limits. Periventricular white matter and corona radiata hypodensities favor chronic ischemic microvascular white matter disease. No intracranial hemorrhage, mass lesion, or acute CVA.  Mild chronic right sphenoid sinusitis. Right mastoid effusion. There is atherosclerotic calcification of the cavernous carotid arteries bilaterally.  CT CERVICAL SPINE FINDINGS  No cervical spine fracture or significant abnormal subluxation. No prevertebral soft tissue swelling or significant bony lesion observed. Clips are present in the left neck. No cervical spine  Clips are present in the left neck I do not observe a significant hematoma in the visualized portion of the neck, although please note that the entirety of the neck is not included. The spinous processes appear unremarkable.  IMPRESSION: 1. No acute intracranial findings or acute cervical spine findings. 2. Periventricular white matter and corona radiata hypodensities favor chronic ischemic microvascular white matter disease. 3. Right mastoid effusion with mild chronic right sphenoid sinusitis. 4. No significant hematoma is seen in the  visualized portion of the the neck.   Electronically Signed   By: Van Clines M.D.   On: 08/13/2014 20:22   Nm Hepatobiliary Including Gb  08/14/2014   CLINICAL DATA:  Abdominal pain.  Nausea.  Pancreatitis.  EXAM: NUCLEAR MEDICINE HEPATOBILIARY IMAGING  TECHNIQUE: Sequential images of the abdomen were obtained out to 60 minutes following intravenous administration of radiopharmaceutical.  RADIOPHARMACEUTICALS:  5.0 [employer] NF-62Z Choletec  COMPARISON:  None.  FINDINGS: Prompt radiopharmaceutical uptake by the liver is seen. Liver is normal in appearance.  Prompt biliary excretion of activity is seen. Gallbladder activity is seen initially on the 20 minutes image. Biliary activity reaches the small bowel initially on the 25 min image.  IMPRESSION: Normal hepatobiliary scan. No evidence of cystic duct or biliary obstruction   Electronically Signed   By: Earle Gell M.D.   On: 08/14/2014 14:22   Dg Chest Port 1 View  08/13/2014   CLINICAL DATA:  Rectal bleeding. Dark colored urine for 3 days. Fall 10 days ago. Back pain.  EXAM: PORTABLE CHEST - 1 VIEW  COMPARISON:  06/25/2012  FINDINGS: Moderate enlargement of the cardiopericardial silhouette observed with indistinct pulmonary vasculature, cephalization of blood flow, and bilateral interstitial accentuation in the lungs. Dual lead pacer remains in place. Prior median sternotomy.  Atherosclerotic calcification of the aortic arch. Bandlike opacities in the retrocardiac region likely reflecting left lower lobe atelectasis. Prior blunting of the costophrenic angles appears improved although this may be positional. The overall degree of interstitial edema appears mildly increased.  IMPRESSION: 1. Moderate enlargement of the cardiopericardial silhouette with interstitial edema. Reduced conspicuity of prior pleural effusions. 2. Suspected bandlike atelectasis at the left lung base. 3. Dual lead pacer remains in place.   Electronically Signed   By: Van Clines M.D.   On: 08/13/2014 20:33   US Abdomen Limited Ruq  08/13/2014   CLINICAL DATA:  Abdominal pain. Recent CT with questionable inflammatory change about the gallbladder.  EXAM: US ABDOMEN LIMITED - RIGHT UPPER QUADRANT  COMPARISON:  CT abdomen/ pelvis earlier this day.  FINDINGS: Gallbladder:  Elongated spanning 11.2 cm. Contains dependent sludge and small echogenic stones. Normal gallbladder wall thickness of 2 mm. No pericholecystic fluid. No sonographic Murphy sign noted.  Common bile duct:  Diameter: 5 mm.  Liver:  Two hepatic cysts again seen, largest measures 3.5 x 2.1 x 2.9 cm. Otherwise within normal limits in parenchymal echogenicity.  IMPRESSION: 1. Distended gallbladder containing dependent sludge and small stones. There are no secondary sonographic findings of acute cholecystitis. Wall thickness is normal, there is a negative sonographic Murphy sign. No biliary dilatation. 2. Hepatic cysts.   Electronically Signed   By: Jeb Levering M.D.   On: 08/13/2014 22:49    Objective  Filed Vitals:   08/13/14 2351 08/14/14 0332 08/14/14 0440 08/14/14 1315  BP: 139/52  148/41 151/71  Pulse: 62  60 60  Temp: 97.9 F (36.6 C)  97.5 F (36.4 C) 97.7 F (36.5 C)  TempSrc: Axillary  Oral Oral  Resp: 20  18 18   Height:      Weight:  83.5 kg (184 lb 1.4 oz)    SpO2: 98%  95% 100%    Intake/Output Summary (Last 24 hours) at 08/14/14 1525 Last data filed at 08/14/14 0729  Gross per 24 hour  Intake      0 ml  Output    500 ml  Net   -500 ml   [employer]   08/13/14  2251 08/14/14 0332  Weight: 83.4 kg (183 lb 13.8 oz) 83.5 kg (184 lb 1.4 oz)    Exam:  General:  NAD  HEENT: no scleral icterus  Cardiovascular: RRR  Respiratory: bibasilar crackles, no wheezing  Abdomen: soft, tender in the epigastric area, BS +  MSK/Extremities: 2+ pitting LE edema  Skin: no rashes  Neuro: non focal   Data Reviewed: Basic Metabolic Panel:  Recent Labs Lab 08/13/14 1733  08/14/14 0507  NA 137 136  K 4.4 4.1  CL 99 99  CO2 29 29  GLUCOSE 86 63*  BUN 63* 59*  CREATININE 2.98* 2.66*  CALCIUM 8.4 8.1*   Liver Function Tests:  Recent Labs Lab 08/13/14 1733 08/14/14 0507  AST 182* 110*  ALT 199* 147*  ALKPHOS 207* 172*  BILITOT 1.7* 1.5*  PROT 7.1 6.1  ALBUMIN 3.1* 2.5*    Recent Labs Lab 08/13/14 1733 08/14/14 0507  LIPASE 606* 307*   CBC:  Recent Labs Lab 08/13/14 1733 08/14/14 0010  WBC 8.2 7.9  NEUTROABS 6.9 5.9  HGB 12.4 12.1  HCT 39.0 38.5  MCV 95.8 95.8  PLT 221 217   Cardiac Enzymes:  Recent Labs Lab 08/13/14 1733 08/14/14 0010 08/14/14 0507 08/14/14 1405  TROPONINI 0.05* 0.04* 0.03 0.03   BNP (last 3 results)  Recent Labs  08/13/14 1733  BNP 1326.6*   Scheduled Meds: . amiodarone  200 mg Oral Daily  . antiseptic oral rinse  7 mL Mouth Rinse q12n4p  . aspirin EC  81 mg Oral Daily  . chlorhexidine  15 mL Mouth Rinse BID  . diltiazem  180 mg Oral QPM  . ferrous sulfate  325 mg Oral Q breakfast  . fluticasone  2 spray Each Nare Daily  . folic acid  1 mg Oral Daily  . hydrocortisone  25 mg Rectal BID  . levalbuterol  0.315 mg Nebulization TID  . levothyroxine  100 mcg Oral QAC breakfast  . loratadine  10 mg Oral Daily  . meclizine  25 mg Oral QHS  . metoprolol  150 mg Oral BID  . sodium chloride  3 mL Intravenous Q12H   Continuous Infusions:   Marzetta Board, MD [facility] Pager [phone]. If 7 PM - 7 AM, please contact night-coverage at [email or web address], password [facility] 08/14/2014, 3:25 PM  LOS: 1 day

## 2014-08-14 NOTE — Consult Note (Signed)
Consultation  Referring Provider:   [facility]   Primary Care Physician:  Leandrew Koyanagi, MD Primary Gastroenterologist: none        Reason for Consultation:  pancreatitis            HPI:   Carla Black is a 81 y.o. female with a significant cardiac history of chronic coumadin who presented to ED yesterday with acute abdominal pain, weakness and a fall. She has chronic, intermittent loose stools but yesterday had several episodes of diarrhea followed by rectal bleeding. Her lipase was 600, no findings of pancreatitis on non-contrast CTscan. LFTs elevated in a mixed pattern. Ultrasound reveals distended gallbladder with small gallstones, no CBD dilation.      Past Medical History  Diagnosis Date  . Hypertension   . Hyperlipidemia   . Heart murmur   . GERD (gastroesophageal reflux disease)   . Joint pain     left knee  . Dizziness   . Chest tightness 02/09/2008  . Hiatal hernia   . CHF (congestive heart failure)   . Angina   . Coronary artery disease   . Hypertrophic cardiomyopathy     subvalvular gradient 45-mm  . Dysrhythmia     hx of atrial fibrilation  . On home oxygen therapy     "2L at rest; 3L w/activity" (06/24/2012)  . Pacemaker 06/24/2012  . Melanoma of back 1961  . Complication of anesthesia   . Claustrophobia     "severe" (06/24/2012)  . Carotid artery occlusion   . Pneumonia     "lastest was 04/26/2011; multiple times before that" (06/24/2012)  . Chronic bronchitis     "used to get it often" (06/24/2012)  . Shortness of breath     "all the time" (06/24/2012)  . Hypothyroidism   . Stroke 2012    hx of TIA  . Arthritis     "2 fingers" (06/24/2012)  . PAF (paroxysmal atrial fibrillation) 06/24/2012  . Symptomatic bradycardia 06/24/2012  . S/P cardiac pacemaker procedure, insertion Medtronic device 06/24/12 06/24/2012  . Carotid artery disease 09/21/2011    40-59% R ICA stenosis; patent L carotid endarterectomy w/minimal restenosis; bilateral vertebral  arteries are antegrade  . Cataract     Past Surgical History  Procedure Laterality Date  . 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 Microlaryngoscopy with co2 laser and excision of vocal cord lesion  1983  . Carotid endarterectomy  1990's    "left" (06/24/2012)  . Cardiac catheterization  03/14/2011    Stent CX 40% with nml ffr  . Tee without cardioversion  05/01/2011    Procedure: TRANSESOPHAGEAL ECHOCARDIOGRAM (TEE);  Surgeon: Sanda Klein;  Location: [facility];  Service: Cardiovascular;  Laterality: N/A;  . Cardioversion  05/01/2011    Procedure: CARDIOVERSION;  Surgeon: Dani Gobble Croitoru;  Location: [facility];  Service: Cardiovascular;  Laterality: N/A;  . Cardioversion  06/30/2011    Procedure: CARDIOVERSION;  Surgeon: Sanda Klein, MD;  Location: [facility];  Service: Cardiovascular;  Laterality: N/A;  . Insert / replace / remove pacemaker  06/24/2012    dual chamber (06/24/2012)  . Tonsillectomy  1940's?  . Appendectomy  1940's  . Abdominal hysterectomy  1970's  . Dilation and curettage of uterus  1960's - 1970's    "alot" (06/24/2012)  . Cataract extraction w/ intraocular lens  implant, bilateral  2000's  . Melanoma excision  1961    "off my back" (06/24/2012)  .  Microlaryngoscopy with co2 laser and excision of vocal cord lesion    . Transthoracic echocardiogram  05/17/2012    see Procedures tab  . Cardiovascular stress test  09/13/2007    R/P MV - EF 56%; mild ischemia in basal and mid inferolateral regions; global LV systolic function normal; EKG negative for ischemia; pt experienced chest pain during stress test, resolved spontaneously  . Eye surgery    . Permanent pacemaker insertion N/A 06/24/2012    Procedure: PERMANENT PACEMAKER INSERTION;  Surgeon: Sanda Klein, MD;  Location: [facility];  Service: Cardiovascular;  Laterality: N/A;    Family History  Problem Relation Age of Onset  . Heart disease Mother   . Stroke Mother   .  Other Father     aneurysm  . Coronary artery disease Other   . Stroke Other      History  Substance Use Topics  . Smoking status: Former Smoker -- 1.00 packs/day for 35 years    Types: Cigarettes    Quit date: 06/22/1989  . Smokeless tobacco: Never Used  . Alcohol Use: 0.0 oz/week     Comment: 06/24/2012 "suppose to drink 6oz wine qd; haven't had a glass in > 1 month"    Prior to Admission medications   Medication Sig Start Date End Date Taking? Authorizing Provider  acetaminophen (TYLENOL) 325 MG tablet Take 1-2 tablets (325-650 mg total) by mouth every 4 (four) hours as needed. 06/25/12  Yes Isaiah Serge, NP  warfarin (COUMADIN) 2.5 MG tablet Take 1-1.5 tablets by mouth daily as directed by coumadin clinic Patient taking differently: Take 1.25-2.5 mg by mouth daily. Take 1 tablet (2.5 mg) by mouth daily except on Mondays Take 1/2 tablet (1.25 mg) by mouth daily. 03/07/14  Yes Mihai Croitoru, MD  amoxicillin (AMOXIL) 875 MG tablet Take 1 tablet (875 mg total) by mouth 2 (two) times daily. Patient not taking: Reported on 07/04/2014 03/28/14   Leandrew Koyanagi, MD  aspirin 81 MG tablet Take 81 mg by mouth daily.     Historical Provider, MD  diltiazem (CARDIZEM CD) 180 MG 24 hr capsule Take 1 capsule (180 mg total) by mouth every evening. 08/28/13   Mihai Croitoru, MD  ferrous sulfate 325 (65 FE) MG tablet Take 1 tablet (325 mg total) by mouth daily with breakfast. 04/06/14   Mihai Croitoru, MD  fexofenadine (ALLEGRA) 180 MG tablet Take 180 mg by mouth daily as needed for allergies or rhinitis.    Historical Provider, MD  FLUARIX QUADRIVALENT 0.5 ML injection  04/04/14   Historical Provider, MD  fluticasone (FLONASE) 50 MCG/ACT nasal spray Place 2 sprays into both nostrils daily. Bid for 1 week then qd 1 month 03/19/14   Leandrew Koyanagi, MD  folic acid (FOLVITE) 1 MG tablet Take 1 tablet (1 mg total) by mouth daily. 04/06/14   Mihai Croitoru, MD  furosemide (LASIX) 40 MG tablet Take 1 tablet  (40 mg total) by mouth daily. Take one tablet daily until weight is below 190, then one tablet 3 times a week (Mon/Wed/Fri. 01/02/14   Mihai Croitoru, MD  guaiFENesin (MUCINEX) 600 MG 12 hr tablet Take 1,200 mg by mouth 2 (two) times daily.    Historical Provider, MD  levalbuterol (XOPENEX) 0.31 MG/3ML nebulizer solution Take 1 ampule by nebulization 3 (three) times daily.    Historical Provider, MD  levothyroxine (SYNTHROID, LEVOTHROID) 100 MCG tablet Take one tablet by mouth once daily before breakfast 07/18/14   Orma Flaming, MD  meclizine (ANTIVERT) 25 MG tablet Take 1 tablet (25 mg total) by mouth at bedtime. 03/19/14   Leandrew Koyanagi, MD  metoprolol (LOPRESSOR) 100 MG tablet Take 1.5 tablets (150 mg total) by mouth 2 (two) times daily. 01/02/14   Mihai Croitoru, MD  mometasone (NASONEX) 50 MCG/ACT nasal spray Place 2 sprays into the nose 2 (two) times daily.    Historical Provider, MD  Multiple Vitamins-Minerals (ICAPS AREDS FORMULA PO) Take 1 capsule by mouth 2 (two) times daily. PER PATIENT THIS IS ICAPS AREDS #2    Historical Provider, MD  Omega 3-6-9 Fatty Acids (OMEGA 3-6-9 COMPLEX PO) Take 1 capsule by mouth 2 (two) times daily.     Historical Provider, MD  PACERONE 200 MG tablet TAKE ONE TABLET BY MOUTH ONCE DAILY 07/16/14   Mihai Croitoru, MD  potassium chloride SA (K-DUR,KLOR-CON) 20 MEQ tablet Take 0.5 tablets (10 mEq total) by mouth daily. When taking furosemide 08/02/13   Mihai Croitoru, MD  PROAIR HFA 108 (90 BASE) MCG/ACT inhaler INHALE TWO PUFFS BY MOUTH EVERY 6 HOURS AS NEEDED 12/28/12   Theda Sers, PA-C  Probiotic Product (ALIGN PO) Take 1 capsule by mouth every morning.     Historical Provider, MD  quinapril (ACCUPRIL) 20 MG tablet TAKE ONE TABLET BY MOUTH ONCE DAILY    Mihai Croitoru, MD  triamcinolone (NASACORT AQ) 55 MCG/ACT AERO nasal inhaler 1 spray each nostril twice a day 10/21/13   Leandrew Koyanagi, MD    Current Facility-Administered Medications  Medication Dose  Route Frequency Provider Last Rate Last Dose  . amiodarone (PACERONE) tablet 200 mg  200 mg Oral Daily Berle Mull, MD      . antiseptic oral rinse (CPC / CETYLPYRIDINIUM CHLORIDE 0.05%) solution 7 mL  7 mL Mouth Rinse q12n4p Berle Mull, MD      . aspirin EC tablet 81 mg  81 mg Oral Daily Berle Mull, MD      . chlorhexidine (PERIDEX) 0.12 % solution 15 mL  15 mL Mouth Rinse BID Berle Mull, MD   15 mL at 08/14/14 0747  . diltiazem (CARDIZEM CD) 24 hr capsule 180 mg  180 mg Oral QPM Berle Mull, MD      . ferrous sulfate tablet 325 mg  325 mg Oral Q breakfast Berle Mull, MD   325 mg at 08/14/14 0800  . fluticasone (FLONASE) 50 MCG/ACT nasal spray 2 spray  2 spray Each Nare Daily Berle Mull, MD      . folic acid (FOLVITE) tablet 1 mg  1 mg Oral Daily Berle Mull, MD      . levalbuterol Penne Lash) nebulizer solution 0.315 mg  0.315 mg Nebulization TID Berle Mull, MD   0.315 mg at 08/14/14 0940  . levothyroxine (SYNTHROID, LEVOTHROID) tablet 100 mcg  100 mcg Oral QAC breakfast Berle Mull, MD   100 mcg at 08/14/14 0747  . loratadine (CLARITIN) tablet 10 mg  10 mg Oral Daily Berle Mull, MD      . meclizine (ANTIVERT) tablet 25 mg  25 mg Oral QHS Berle Mull, MD   25 mg at 08/14/14 0023  . metoprolol (LOPRESSOR) tablet 150 mg  150 mg Oral BID Berle Mull, MD      . morphine 2 MG/ML injection 2 mg  2 mg Intravenous Q4H PRN Berle Mull, MD      . ondansetron (ZOFRAN) tablet 4 mg  4 mg Oral Q6H PRN Berle Mull, MD       Or  .  ondansetron (ZOFRAN) injection 4 mg  4 mg Intravenous Q6H PRN Berle Mull, MD      . oxyCODONE (Oxy IR/ROXICODONE) immediate release tablet 5 mg  5 mg Oral Q4H PRN Berle Mull, MD      . sodium chloride 0.9 % injection 3 mL  3 mL Intravenous Q12H Berle Mull, MD   3 mL at 08/14/14 0000    Allergies as of 08/13/2014 - Review Complete 08/13/2014  Allergen Reaction Noted  . Metoprolol Other (See Comments) 04/28/2011  . Pseudoephedrine Other (See Comments)  06/17/2012  . Statins Other (See Comments) 04/28/2011  . Codeine Nausea Only and Other (See Comments) 02/24/2011  . Seldane [terfenadine] Nausea Only and Other (See Comments) 02/24/2011    Review of Systems:    Poor appetite for several months. Chronic SOB. All other systems reviewed and negative except where noted in HPI.   Physical Exam:  Vital signs in last 24 hours: Temp:  [97.5 F (36.4 C)-97.9 F (36.6 C)] 97.5 F (36.4 C) (02/23 0440) Pulse Rate:  [59-62] 60 (02/23 0440) Resp:  [18-23] 18 (02/23 0440) BP: (139-151)/(41-58) 148/41 mmHg (02/23 0440) SpO2:  [95 %-100 %] 95 % (02/23 0440) Weight:  [183 lb 13.8 oz (83.4 kg)-184 lb 1.4 oz (83.5 kg)] 184 lb 1.4 oz (83.5 kg) (02/23 0332)   General:   Pleasant white female in NAD Head:  Normocephalic and atraumatic. Eyes:   No icterus.   Conjunctiva pink. Ears:  Normal auditory acuity. Neck:  Supple; no masses felt Lungs:  Respirations even and unlabored. Bibasilar rales.   No wheezes, crackles, or rhonchi.  Heart:  Regular rate and rhythm;  Soft murmur. Abdomen:  Soft, nondistended, mild diffuse upper abdominal tenderness. Normal bowel sounds. No appreciable masses or hepatomegaly.  Rectal:  Old hemorrhoid tags. No stool or blood in vaults.   Msk:  Symmetrical without gross deformities.  Extremities:  Pitting edema BLE Neurologic:  Alert and  oriented x4;  grossly normal neurologically. Skin:  Intact without significant lesions or rashes. Cervical Nodes:  No significant cervical adenopathy. Psych:  Alert and cooperative. Normal affect.  LAB RESULTS:  Recent Labs  08/13/14 1733 08/14/14 0010  WBC 8.2 7.9  HGB 12.4 12.1  HCT 39.0 38.5  PLT 221 217   BMET  Recent Labs  08/13/14 1733 08/14/14 0507  NA 137 136  K 4.4 4.1  CL 99 99  CO2 29 29  GLUCOSE 86 63*  BUN 63* 59*  CREATININE 2.98* 2.66*  CALCIUM 8.4 8.1*   LFT  Recent Labs  08/14/14 0507  PROT 6.1  ALBUMIN 2.5*  AST 110*  ALT 147*  ALKPHOS 172*   BILITOT 1.5*   PT/INR  Recent Labs  08/13/14 1733  LABPROT 41.6*  INR 4.30*    STUDIES: Ct Abdomen Pelvis Wo Contrast  08/13/2014   CLINICAL DATA:  Bleeding.  No other indication provided.  EXAM: CT ABDOMEN AND PELVIS WITHOUT CONTRAST  TECHNIQUE: Multidetector CT imaging of the abdomen and pelvis was performed following the standard protocol without IV contrast.  COMPARISON:  08/10/2011  FINDINGS: Mild dependent changes in the lung bases. Diffuse cardiac enlargement. Postoperative changes in the mediastinum. Calcification in the mitral valve annulus.  Evaluation of solid organs and vascular structures is limited without IV contrast material. Circumscribed low-attenuation lesion in segment 4 of the liver measuring 2.3 x 3.6 cm. This is unchanged since prior study and probably represents a cyst. Appearance of the gallbladder is nonspecific. There is is suggestion of  mild gallbladder wall thickening and infiltration. Single tiny stone identified. Early changes of cholecystitis not excluded. Unenhanced appearance of the pancreas, spleen, kidneys, adrenal glands, and inferior vena cava are unremarkable. Calcification of the abdominal aorta without aneurysm. Low-attenuation lymph node between the aorta and IVC measures 12 mm in is likely reactive. Stomach, small bowel, and colon are decompressed. Scattered diverticula in the colon. No free air or free fluid in the abdomen. No abnormal retroperitoneal fluid collections.  Pelvis: Diverticulosis of the sigmoid colon without inflammatory change. Uterus appears surgically absent. No pelvic mass or lymphadenopathy. No free or loculated pelvic fluid collections. Appendix is not identified. Bladder wall is not thickened. Small left inguinal hernia containing fat. Degenerative changes in the spine and hips.  IMPRESSION: Possible inflammatory changes around the gallbladder. Correlate clinically for suspicion of cholecystitis. Cyst in the liver. No acute process  otherwise identified.   Electronically Signed   By: Lucienne Capers M.D.   On: 08/13/2014 20:24     US Abdomen Limited Ruq  08/13/2014   CLINICAL DATA:  Abdominal pain. Recent CT with questionable inflammatory change about the gallbladder.  EXAM: US ABDOMEN LIMITED - RIGHT UPPER QUADRANT  COMPARISON:  CT abdomen/ pelvis earlier this day.  FINDINGS: Gallbladder:  Elongated spanning 11.2 cm. Contains dependent sludge and small echogenic stones. Normal gallbladder wall thickness of 2 mm. No pericholecystic fluid. No sonographic Murphy sign noted.  Common bile duct:  Diameter: 5 mm.  Liver:  Two hepatic cysts again seen, largest measures 3.5 x 2.1 x 2.9 cm. Otherwise within normal limits in parenchymal echogenicity.  IMPRESSION: 1. Distended gallbladder containing dependent sludge and small stones. There are no secondary sonographic findings of acute cholecystitis. Wall thickness is normal, there is a negative sonographic Murphy sign. No biliary dilatation. 2. Hepatic cysts.   Electronically Signed   By: Jeb Levering M.D.   On: 08/13/2014 22:49   PREVIOUS ENDOSCOPIES:            Colonoscopy 10 years ago - unknown MD, told she would never need another.    Impression / Plan:   81. 81 year old female with acute upper abdominal pain. Lipase 600 yesterday, 300 today. Pancreas unremarkable on non-contrast CTscan. May have biochemical pancreatitis, ? Etiology not clear.  Ultrasound reveals distended gallbladder with sludge / stones . CBD normal. Surgery following and has ordered HIDA to evaluate for cholecystitis though deemed poor surgical candidate. If patient has pancreatitis then IVFs are imperative though difficult situation as patient has elevated BNP and pitting edema. Currently not hemo- concentrated with hct of 38.   2. AKI, improved overnight. Currently not getting any IVF. She got 250cc bolus in ED.   3. Multiple medical problems as listed in PMH.  4. Chronic coumadin for afib. INR 4.3  5.  Acute on chronic diarrhea. No diarrhea today. If recurs then check stool studies.   6. Intermittent rectal bleeding with increased amount of bleeding yesterday. Hopefully bleeding perianal ( Hemorrhoids). Will try steroid suppositories  Thanks   LOS: 1 day   Tye Savoy  08/14/2014, 10:16 AM     Attending physician's note   I have taken a history, examined the patient and reviewed the chart. I agree with the Advanced Practitioner's note, impression and recommendations. Acute upper abdominal pain with suspected pancreatitis (lipase elevated however pancreas normal on CT). US shows cholelithiasis and no biliary dilation. Mildly elevated LFTs noted. Possible choledocholithiasis but unusual to have no biliary dilation. R/O hepatocellular process such as  cardiac congestion leading to elevated LFTs. Probably not a candidate for MRCP given pacemaker. Multiple comordibities make her a poor candidate for procedures, sedation. Hold Coumadin for now. Trend lipase and LFTs.  Ladene Artist, MD [facility]

## 2014-08-15 DIAGNOSIS — I5033 Acute on chronic diastolic (congestive) heart failure: Secondary | ICD-10-CM

## 2014-08-15 DIAGNOSIS — N179 Acute kidney failure, unspecified: Secondary | ICD-10-CM | POA: Insufficient documentation

## 2014-08-15 LAB — CBC
HCT: 42.6 % (ref 36.0–46.0)
Hemoglobin: 13.4 g/dL (ref 12.0–15.0)
MCH: 30.8 pg (ref 26.0–34.0)
MCHC: 31.5 g/dL (ref 30.0–36.0)
MCV: 97.9 fL (ref 78.0–100.0)
Platelets: 231 10*3/uL (ref 150–400)
RBC: 4.35 MIL/uL (ref 3.87–5.11)
RDW: 15.9 % — ABNORMAL HIGH (ref 11.5–15.5)
WBC: 7.1 10*3/uL (ref 4.0–10.5)

## 2014-08-15 LAB — COMPREHENSIVE METABOLIC PANEL
ALT: 137 U/L — ABNORMAL HIGH (ref 0–35)
AST: 87 U/L — AB (ref 0–37)
Albumin: 2.8 g/dL — ABNORMAL LOW (ref 3.5–5.2)
Alkaline Phosphatase: 180 U/L — ABNORMAL HIGH (ref 39–117)
Anion gap: 11 (ref 5–15)
BUN: 51 mg/dL — AB (ref 6–23)
CALCIUM: 8.9 mg/dL (ref 8.4–10.5)
CO2: 30 mmol/L (ref 19–32)
Chloride: 99 mmol/L (ref 96–112)
Creatinine, Ser: 1.96 mg/dL — ABNORMAL HIGH (ref 0.50–1.10)
GFR calc non Af Amer: 22 mL/min — ABNORMAL LOW (ref >90)
GFR, EST AFRICAN AMERICAN: 26 mL/min — AB (ref >90)
Glucose, Bld: 55 mg/dL — ABNORMAL LOW (ref 70–99)
Potassium: 4.9 mmol/L (ref 3.5–5.1)
Sodium: 140 mmol/L (ref 135–145)
TOTAL PROTEIN: 6.9 g/dL (ref 6.0–8.3)
Total Bilirubin: 2.3 mg/dL — ABNORMAL HIGH (ref 0.3–1.2)

## 2014-08-15 LAB — PROTIME-INR
INR: 3.39 — ABNORMAL HIGH (ref 0.00–1.49)
Prothrombin Time: 34.5 seconds — ABNORMAL HIGH (ref 11.6–15.2)

## 2014-08-15 LAB — LIPASE, BLOOD: LIPASE: 145 U/L — AB (ref 11–59)

## 2014-08-15 MED ORDER — SODIUM CHLORIDE 0.9 % IV SOLN
INTRAVENOUS | Status: DC
  Administered 2014-08-15 – 2014-08-16 (×2): via INTRAVENOUS

## 2014-08-15 NOTE — Progress Notes (Signed)
ANTICOAGULATION CONSULT NOTE - Initial Consult  Pharmacy Consult for warfarin Indication: atrial fibrillation  Allergies  Allergen Reactions  . Metoprolol Other (See Comments)    Bradycardia  . Pseudoephedrine Other (See Comments)    Insomnia (took 1 tablet and could not sleep for 3 days)  . Statins Other (See Comments)    myalgia  . Codeine Nausea Only and Other (See Comments)    dizziness  . Seldane [Terfenadine] Nausea Only and Other (See Comments)    dizziness    Patient Measurements: Height: 5\' 3"  (160 cm) Weight: 182 lb 15.7 oz (83 kg) IBW/kg (Calculated) : 52.4   Vital Signs: Temp: 97.5 F (36.4 C) (02/24 0526) Temp Source: Oral (02/24 0526) BP: 128/64 mmHg (02/24 0526) Pulse Rate: 60 (02/24 0526)  Labs:  Recent Labs  08/13/14 1733 08/14/14 0010 08/14/14 0507 08/14/14 1405 08/15/14 0540  HGB 12.4 12.1  --   --  13.4  HCT 39.0 38.5  --   --  42.6  PLT 221 217  --   --  231  LABPROT 41.6*  --   --  35.5* 34.5*  INR 4.30*  --   --  3.52* 3.39*  CREATININE 2.98*  --  2.66*  --  1.96*  TROPONINI 0.05* 0.04* 0.03 0.03  --     Estimated Creatinine Clearance: 21.8 mL/min (by C-G formula based on Cr of 1.96).   Medical History: Past Medical History  Diagnosis Date  . Hypertension   . Hyperlipidemia   . Heart murmur   . GERD (gastroesophageal reflux disease)   . Joint pain     left knee  . Dizziness   . Chest tightness 02/09/2008  . Hiatal hernia   . CHF (congestive heart failure)   . Angina   . Coronary artery disease   . Hypertrophic cardiomyopathy     subvalvular gradient 45-mm  . Dysrhythmia     hx of atrial fibrilation  . On home oxygen therapy     "2L at rest; 3L w/activity" (06/24/2012)  . Pacemaker 06/24/2012  . Melanoma of back 1961  . Complication of anesthesia   . Claustrophobia     "severe" (06/24/2012)  . Carotid artery occlusion   . Pneumonia     "lastest was 04/26/2011; multiple times before that" (06/24/2012)  . Chronic bronchitis      "used to get it often" (06/24/2012)  . Shortness of breath     "all the time" (06/24/2012)  . Hypothyroidism   . Stroke 2012    hx of TIA  . Arthritis     "2 fingers" (06/24/2012)  . PAF (paroxysmal atrial fibrillation) 06/24/2012  . Symptomatic bradycardia 06/24/2012  . S/P cardiac pacemaker procedure, insertion Medtronic device 06/24/12 06/24/2012  . Carotid artery disease 09/21/2011    40-59% R ICA stenosis; patent L carotid endarterectomy w/minimal restenosis; bilateral vertebral arteries are antegrade  . Cataract     Medications:  Scheduled:  . amiodarone  200 mg Oral Daily  . antiseptic oral rinse  7 mL Mouth Rinse q12n4p  . aspirin EC  81 mg Oral Daily  . chlorhexidine  15 mL Mouth Rinse BID  . diltiazem  180 mg Oral QPM  . ferrous sulfate  325 mg Oral Q breakfast  . fluticasone  2 spray Each Nare Daily  . folic acid  1 mg Oral Daily  . hydrocortisone  25 mg Rectal BID  . levalbuterol  0.315 mg Nebulization TID  . levothyroxine  100 mcg Oral  QAC breakfast  . loratadine  10 mg Oral Daily  . meclizine  25 mg Oral QHS  . metoprolol  150 mg Oral BID  . sodium chloride  3 mL Intravenous Q12H  . Warfarin - Pharmacist Dosing Inpatient   Does not apply q1800    Assessment: 81 y.o. female with a significant cardiac history of chronic coumadin who presented to ED yesterday with acute abdominal pain, weakness and a fall. She has chronic, intermittent loose stools but yesterday had several episodes of diarrhea followed by rectal bleeding. Her lipase was 600, no findings of pancreatitis on non-contrast CTscan. LFTs elevated in a mixed pattern. Ultrasound reveals distended gallbladder with small gallstones, no CBD dilation.  Pharmacy consulted to manage warfarin.  -Per last anti-coag note on 1/29 warfarin dose is 2.5mg  daily except 1.25mg  on Mon and Fri. -pt has had poor appetite for several months   Today: INR 3.39 H/H WNL Scr 1.96, CrCl 21.65mls/min-likely due to dehydration NPO except  sips with meds, ice chips  Goal of Therapy:  INR 2-3   Plan:  Hold warfarin  Daily INR  Dolly Rias RPh 08/15/2014, 8:46 AM Pager [phone]

## 2014-08-15 NOTE — Progress Notes (Signed)
    Subjective:  No chest pain or shortness of breath. Eating ice chips, sitting in chair at bedside.   Objective:  Vital Signs in the last 24 hours: Temp:  [97.5 F (36.4 C)-97.7 F (36.5 C)] 97.5 F (36.4 C) (02/24 0526) Pulse Rate:  [60] 60 (02/24 0526) Resp:  [18] 18 (02/24 0526) BP: (128-155)/(59-71) 128/64 mmHg (02/24 0526) SpO2:  [97 %-100 %] 98 % (02/24 0526) Weight:  [182 lb 15.7 oz (83 kg)] 182 lb 15.7 oz (83 kg) (02/24 0500)  Intake/Output from previous day: 02/23 0701 - 02/24 0700 In: -  Out: 1100 [Urine:1100]  Physical Exam: Pt is alert and oriented, NAD HEENT: normal Neck: JVP - elevated Lungs: rales in the bases CV: RRR with 3/6 systolic murmur at the LSB Abd: soft, NT, Positive BS, no hepatomegaly Ext: 1+ pretibial edema bilaterally, distal pulses intact and equal Skin: warm/dry no rash   Lab Results:  Recent Labs  08/14/14 0010 08/15/14 0540  WBC 7.9 7.1  HGB 12.1 13.4  PLT 217 231    Recent Labs  08/13/14 1733 08/14/14 0507  NA 137 136  K 4.4 4.1  CL 99 99  CO2 29 29  GLUCOSE 86 63*  BUN 63* 59*  CREATININE 2.98* 2.66*    Recent Labs  08/14/14 0507 08/14/14 1405  TROPONINI 0.03 0.03    Cardiac Studies: 2D Echo: Study Conclusions  - Left ventricle: The cavity size was normal. Wall thickness was increased in a pattern of severe LVH. Systolic function was normal. The estimated ejection fraction was in the range of 60% to 65%. Wall motion was normal; there were no regional wall motion abnormalities. - Aortic valve: There was mild to moderate stenosis. There was mild regurgitation. - Mitral valve: Mildly to moderately calcified annulus. There was mild to moderate regurgitation. - Left atrium: The atrium was severely dilated. - Right ventricle: The cavity size was moderately dilated. Systolic function was moderately reduced. - Tricuspid valve: There was moderate regurgitation. - Pulmonary arteries: Systolic  pressure was severely increased. PA peak pressure: 94 mm Hg (S).  Tele: Paced rhythm 60 bpm  Assessment/Plan:  1. Acute on chronic systolic CHF, secondary to underlying hypertrophic cardiomyopathy 2. Severe mitral regurgitation 3. Severe pulmonary HTN 4. Acute pancreatitis 5. Atrial fibrillation 6. AKI on CKD 3 7. Paroxysmal AFib, currently paced  Difficult situation. The patient has severe MR and secondary pulmonary HTN related to this. She is in no respiratory distress and appears quite comfortable despite exam evidence of heart failure. In setting of acute injury and pancreatitis, I think it's best to give her gentle fluids today and follow labs tomorrow. As creatinine improves, she clearly will need diuresis. INR supratherapeutic, managed by pharmacy. Continue metoprolol and diltiazem as negative inotropes in setting of hypertrophic CM.  Will follow with you.  As part of her evaluation today, I reviewed her echo, cardiology office visits, and past cath data.  Sherren Mocha, M.D. 08/15/2014, 6:35 AM

## 2014-08-15 NOTE — Progress Notes (Signed)
PROGRESS NOTE  Carla Black OZH:[id number] DOB: 1927/09/07 DOA: 08/13/2014 PCP: Leandrew Koyanagi, MD  HPI: Carla Black is a 81 y.o. female with Past medical history of hypertension, dyslipidemia, HOCM, chronic combined CHF, chronic hypoxic respiratory failure, pacemaker implant, GERD, A. fib, peripheral vascular disease. The patient presented with complaints of abdominal pain and a fall, found to have acute pancreatitis, cholelithiasis, worsening of her CHF.  Subjective / 24 H Interval events - She is feeling better this morning, less abdominal pain, no nausea  Assessment/Plan: Principal Problem:   Pancreatitis, acute Active Problems:   Abdominal pain   AKI (acute kidney injury)   Elevated LFTs   Acute on chronic combined systolic and diastolic heart failure   Supratherapeutic INR   BRBPR (bright red blood per rectum)   Abnormal gall bladder diagnostic imaging   Acute pancreatitis  Acute pancreatitis / abdominal pain / poor po intake  - possibly related to cholelithiasis - general surgery/GI consulted, appreciate input - unfortunately aggressive hydration is difficult given heart failure - she is not hemoconcentrated - She seems to be resolving her pancreatitis on her own. Lungs were clear liquids today and continue to monitor. Her lipase continues to trend down.  HOCM / Mitral regurgitation / acute on chronic diastolic heart failure - overall difficult to hydrate due to fluid overload, although she is a bit intravascularly depleted. She has pitting LE edema and CXR with cardiomegaly and interstitial edema - repeated 2D echo with severe LVH, EF 60-65%, aortic stenosis/regurgitation, mitral valve regurgitation - Gentle hydration today at 50 mL per hour, appreciate cardiology's assistance, may need diuresis soon  A fib  - now pacemaker dependent - INR high, hold coumadin  Supratherapeutic INR  - Coumadin on hold, no evidence of ongoing G Ibleed  Bright red  blood per rectum - intermittently, her Hb is stable, ?hemorrhoids per GI  Acute on chronic renal failure - CKD stage III - likely due to dehydration, improved with fluids - continue to closely monitor  Elevated LFTs with elevated bilirubin - no CBD dilatation, GI following - AST/ALT improving, bilirubin increasing but may lag behind   Diet: Diet clear liquid Fluids: none  DVT Prophylaxis: COumadin  Code Status: Full Code Family Communication: d/w patient Disposition Plan: inpatient  Consultants:  General Surgery   GI  Cardiology   Procedures:  None    Antibiotics  Anti-infectives    None       Studies  Ct Abdomen Pelvis Wo Contrast  08/13/2014   CLINICAL DATA:  Bleeding.  No other indication provided.  EXAM: CT ABDOMEN AND PELVIS WITHOUT CONTRAST  TECHNIQUE: Multidetector CT imaging of the abdomen and pelvis was performed following the standard protocol without IV contrast.  COMPARISON:  08/10/2011  FINDINGS: Mild dependent changes in the lung bases. Diffuse cardiac enlargement. Postoperative changes in the mediastinum. Calcification in the mitral valve annulus.  Evaluation of solid organs and vascular structures is limited without IV contrast material. Circumscribed low-attenuation lesion in segment 4 of the liver measuring 2.3 x 3.6 cm. This is unchanged since prior study and probably represents a cyst. Appearance of the gallbladder is nonspecific. There is is suggestion of mild gallbladder wall thickening and infiltration. Single tiny stone identified. Early changes of cholecystitis not excluded. Unenhanced appearance of the pancreas, spleen, kidneys, adrenal glands, and inferior vena cava are unremarkable. Calcification of the abdominal aorta without aneurysm. Low-attenuation lymph node between the aorta and IVC measures 12 mm in is likely reactive. Stomach,  small bowel, and colon are decompressed. Scattered diverticula in the colon. No free air or free fluid in the  abdomen. No abnormal retroperitoneal fluid collections.  Pelvis: Diverticulosis of the sigmoid colon without inflammatory change. Uterus appears surgically absent. No pelvic mass or lymphadenopathy. No free or loculated pelvic fluid collections. Appendix is not identified. Bladder wall is not thickened. Small left inguinal hernia containing fat. Degenerative changes in the spine and hips.  IMPRESSION: Possible inflammatory changes around the gallbladder. Correlate clinically for suspicion of cholecystitis. Cyst in the liver. No acute process otherwise identified.   Electronically Signed   By: Lucienne Capers M.D.   On: 08/13/2014 20:24   Ct Head Wo Contrast  08/13/2014   CLINICAL DATA:  Rectal bleeding. Anti coagulation. Bruising along the back of the neck.  EXAM: CT HEAD WITHOUT CONTRAST  CT CERVICAL SPINE WITHOUT CONTRAST  TECHNIQUE: Multidetector CT imaging of the head and cervical spine was performed following the standard protocol without intravenous contrast. Multiplanar CT image reconstructions of the cervical spine were also generated.  COMPARISON:  10/14/2009  FINDINGS: CT HEAD FINDINGS  The brainstem, cerebellum, cerebral peduncles, thalamus, basal ganglia, basilar cisterns, and ventricular system appear within normal limits. Periventricular white matter and corona radiata hypodensities favor chronic ischemic microvascular white matter disease. No intracranial hemorrhage, mass lesion, or acute CVA.  Mild chronic right sphenoid sinusitis. Right mastoid effusion. There is atherosclerotic calcification of the cavernous carotid arteries bilaterally.  CT CERVICAL SPINE FINDINGS  No cervical spine fracture or significant abnormal subluxation. No prevertebral soft tissue swelling or significant bony lesion observed. Clips are present in the left neck. No cervical spine  Clips are present in the left neck I do not observe a significant hematoma in the visualized portion of the neck, although please note that  the entirety of the neck is not included. The spinous processes appear unremarkable.  IMPRESSION: 1. No acute intracranial findings or acute cervical spine findings. 2. Periventricular white matter and corona radiata hypodensities favor chronic ischemic microvascular white matter disease. 3. Right mastoid effusion with mild chronic right sphenoid sinusitis. 4. No significant hematoma is seen in the visualized portion of the the neck.   Electronically Signed   By: Van Clines M.D.   On: 08/13/2014 20:22   Ct Cervical Spine Wo Contrast  08/13/2014   CLINICAL DATA:  Rectal bleeding. Anti coagulation. Bruising along the back of the neck.  EXAM: CT HEAD WITHOUT CONTRAST  CT CERVICAL SPINE WITHOUT CONTRAST  TECHNIQUE: Multidetector CT imaging of the head and cervical spine was performed following the standard protocol without intravenous contrast. Multiplanar CT image reconstructions of the cervical spine were also generated.  COMPARISON:  10/14/2009  FINDINGS: CT HEAD FINDINGS  The brainstem, cerebellum, cerebral peduncles, thalamus, basal ganglia, basilar cisterns, and ventricular system appear within normal limits. Periventricular white matter and corona radiata hypodensities favor chronic ischemic microvascular white matter disease. No intracranial hemorrhage, mass lesion, or acute CVA.  Mild chronic right sphenoid sinusitis. Right mastoid effusion. There is atherosclerotic calcification of the cavernous carotid arteries bilaterally.  CT CERVICAL SPINE FINDINGS  No cervical spine fracture or significant abnormal subluxation. No prevertebral soft tissue swelling or significant bony lesion observed. Clips are present in the left neck. No cervical spine  Clips are present in the left neck I do not observe a significant hematoma in the visualized portion of the neck, although please note that the entirety of the neck is not included. The spinous processes appear unremarkable.  IMPRESSION:  1. No acute  intracranial findings or acute cervical spine findings. 2. Periventricular white matter and corona radiata hypodensities favor chronic ischemic microvascular white matter disease. 3. Right mastoid effusion with mild chronic right sphenoid sinusitis. 4. No significant hematoma is seen in the visualized portion of the the neck.   Electronically Signed   By: Van Clines M.D.   On: 08/13/2014 20:22   Nm Hepatobiliary Including Gb  08/14/2014   CLINICAL DATA:  Abdominal pain.  Nausea.  Pancreatitis.  EXAM: NUCLEAR MEDICINE HEPATOBILIARY IMAGING  TECHNIQUE: Sequential images of the abdomen were obtained out to 60 minutes following intravenous administration of radiopharmaceutical.  RADIOPHARMACEUTICALS:  5.0 Millicurie OZ-30Q Choletec  COMPARISON:  None.  FINDINGS: Prompt radiopharmaceutical uptake by the liver is seen. Liver is normal in appearance.  Prompt biliary excretion of activity is seen. Gallbladder activity is seen initially on the 20 minutes image. Biliary activity reaches the small bowel initially on the 25 min image.  IMPRESSION: Normal hepatobiliary scan. No evidence of cystic duct or biliary obstruction   Electronically Signed   By: Earle Gell M.D.   On: 08/14/2014 14:22   Dg Chest Port 1 View  08/13/2014   CLINICAL DATA:  Rectal bleeding. Dark colored urine for 3 days. Fall 10 days ago. Back pain.  EXAM: PORTABLE CHEST - 1 VIEW  COMPARISON:  06/25/2012  FINDINGS: Moderate enlargement of the cardiopericardial silhouette observed with indistinct pulmonary vasculature, cephalization of blood flow, and bilateral interstitial accentuation in the lungs. Dual lead pacer remains in place. Prior median sternotomy.  Atherosclerotic calcification of the aortic arch. Bandlike opacities in the retrocardiac region likely reflecting left lower lobe atelectasis. Prior blunting of the costophrenic angles appears improved although this may be positional. The overall degree of interstitial edema appears mildly  increased.  IMPRESSION: 1. Moderate enlargement of the cardiopericardial silhouette with interstitial edema. Reduced conspicuity of prior pleural effusions. 2. Suspected bandlike atelectasis at the left lung base. 3. Dual lead pacer remains in place.   Electronically Signed   By: Van Clines M.D.   On: 08/13/2014 20:33   US Abdomen Limited Ruq  08/13/2014   CLINICAL DATA:  Abdominal pain. Recent CT with questionable inflammatory change about the gallbladder.  EXAM: US ABDOMEN LIMITED - RIGHT UPPER QUADRANT  COMPARISON:  CT abdomen/ pelvis earlier this day.  FINDINGS: Gallbladder:  Elongated spanning 11.2 cm. Contains dependent sludge and small echogenic stones. Normal gallbladder wall thickness of 2 mm. No pericholecystic fluid. No sonographic Murphy sign noted.  Common bile duct:  Diameter: 5 mm.  Liver:  Two hepatic cysts again seen, largest measures 3.5 x 2.1 x 2.9 cm. Otherwise within normal limits in parenchymal echogenicity.  IMPRESSION: 1. Distended gallbladder containing dependent sludge and small stones. There are no secondary sonographic findings of acute cholecystitis. Wall thickness is normal, there is a negative sonographic Murphy sign. No biliary dilatation. 2. Hepatic cysts.   Electronically Signed   By: Jeb Levering M.D.   On: 08/13/2014 22:49    Objective  Filed Vitals:   08/15/14 0526 08/15/14 0930 08/15/14 1411 08/15/14 1541  BP: 128/64  140/52   Pulse: 60  58   Temp: 97.5 F (36.4 C)  97.6 F (36.4 C)   TempSrc: Oral  Oral   Resp: 18  18   Height:      Weight:      SpO2: 98% 98% 97% 81%    Intake/Output Summary (Last 24 hours) at 08/15/14 1608 Last data filed at  08/15/14 1300  Gross per 24 hour  Intake    840 ml  Output   1250 ml  Net   -410 ml   Filed Weights   08/13/14 2251 08/14/14 0332 08/15/14 0500  Weight: 83.4 kg (183 lb 13.8 oz) 83.5 kg (184 lb 1.4 oz) 83 kg (182 lb 15.7 oz)    Exam:  General:  NAD  HEENT: no scleral  icterus  Cardiovascular: RRR  Respiratory: bibasilar crackles, no wheezing  Abdomen: soft, tender in the epigastric area, BS +  MSK/Extremities: 2+ pitting LE edema  Skin: no rashes  Neuro: non focal   Data Reviewed: Basic Metabolic Panel:  Recent Labs Lab 08/13/14 1733 08/14/14 0507 08/15/14 0540  NA 137 136 140  K 4.4 4.1 4.9  CL 99 99 99  CO2 29 29 30   GLUCOSE 86 63* 55*  BUN 63* 59* 51*  CREATININE 2.98* 2.66* 1.96*  CALCIUM 8.4 8.1* 8.9   Liver Function Tests:  Recent Labs Lab 08/13/14 1733 08/14/14 0507 08/15/14 0540  AST 182* 110* 87*  ALT 199* 147* 137*  ALKPHOS 207* 172* 180*  BILITOT 1.7* 1.5* 2.3*  PROT 7.1 6.1 6.9  ALBUMIN 3.1* 2.5* 2.8*    Recent Labs Lab 08/13/14 1733 08/14/14 0507 08/15/14 0540  LIPASE 606* 307* 145*   CBC:  Recent Labs Lab 08/13/14 1733 08/14/14 0010 08/15/14 0540  WBC 8.2 7.9 7.1  NEUTROABS 6.9 5.9  --   HGB 12.4 12.1 13.4  HCT 39.0 38.5 42.6  MCV 95.8 95.8 97.9  PLT 221 217 231   Cardiac Enzymes:  Recent Labs Lab 08/13/14 1733 08/14/14 0010 08/14/14 0507 08/14/14 1405  TROPONINI 0.05* 0.04* 0.03 0.03   BNP (last 3 results)  Recent Labs  08/13/14 1733  BNP 1326.6*   Scheduled Meds: . amiodarone  200 mg Oral Daily  . antiseptic oral rinse  7 mL Mouth Rinse q12n4p  . aspirin EC  81 mg Oral Daily  . chlorhexidine  15 mL Mouth Rinse BID  . diltiazem  180 mg Oral QPM  . ferrous sulfate  325 mg Oral Q breakfast  . fluticasone  2 spray Each Nare Daily  . folic acid  1 mg Oral Daily  . hydrocortisone  25 mg Rectal BID  . levalbuterol  0.315 mg Nebulization TID  . levothyroxine  100 mcg Oral QAC breakfast  . loratadine  10 mg Oral Daily  . meclizine  25 mg Oral QHS  . metoprolol  150 mg Oral BID  . sodium chloride  3 mL Intravenous Q12H  . Warfarin - Pharmacist Dosing Inpatient   Does not apply q1800   Continuous Infusions: . sodium chloride 50 mL/hr at 08/15/14 0825    Marzetta Board,  MD [facility] Pager [phone]. If 7 PM - 7 AM, please contact night-coverage at [email or web address], password [facility] 08/15/2014, 4:08 PM  LOS: 2 days

## 2014-08-15 NOTE — Progress Notes (Signed)
Subjective: She has no pain and say she feels fine.  She says the pain she had was last Thursday and hasn't bothered her much since then. Weakness remains her biggest complaint.  Up in chair and comfortable.  On chronic O2 at home and here.  Objective: Vital signs in last 24 hours: Temp:  [97.5 F (36.4 C)-97.7 F (36.5 C)] 97.5 F (36.4 C) (02/24 0526) Pulse Rate:  [60] 60 (02/24 0526) Resp:  [18] 18 (02/24 0526) BP: (128-155)/(59-71) 128/64 mmHg (02/24 0526) SpO2:  [97 %-100 %] 98 % (02/24 0526) Weight:  [83 kg (182 lb 15.7 oz)] 83 kg (182 lb 15.7 oz) (02/24 0500)   Nothing PO or IV recorded Urine output recorded at 1100 ml Afebrile, VSS Creatinine is improving  LFT's show rising bilirubin, and improved alk phos/AST/ALT WBC remains normal INR down to 3.39 HIDA scan is normal, no cystic duct or biliary obstruction. Intake/Output from previous day: 02/23 0701 - 02/24 0700 In: -  Out: 1100 [Urine:1100] Intake/Output this shift:    General appearance: alert, cooperative and no distress GI: soft, non-tender; bowel sounds normal; no masses,  no organomegaly  Lab Results:   Recent Labs  08/14/14 0010 08/15/14 0540  WBC 7.9 7.1  HGB 12.1 13.4  HCT 38.5 42.6  PLT 217 231    BMET  Recent Labs  08/14/14 0507 08/15/14 0540  NA 136 140  K 4.1 4.9  CL 99 99  CO2 29 30  GLUCOSE 63* 55*  BUN 59* 51*  CREATININE 2.66* 1.96*  CALCIUM 8.1* 8.9   PT/INR  Recent Labs  08/14/14 1405 08/15/14 0540  LABPROT 35.5* 34.5*  INR 3.52* 3.39*     Recent Labs Lab 08/13/14 1733 08/14/14 0507 08/15/14 0540  AST 182* 110* 87*  ALT 199* 147* 137*  ALKPHOS 207* 172* 180*  BILITOT 1.7* 1.5* 2.3*  PROT 7.1 6.1 6.9  ALBUMIN 3.1* 2.5* 2.8*     Lipase     Component Value Date/Time   LIPASE 307* 08/14/2014 0507     Studies/Results: Ct Abdomen Pelvis Wo Contrast  08/13/2014   CLINICAL DATA:  Bleeding.  No other indication provided.  EXAM: CT ABDOMEN AND PELVIS  WITHOUT CONTRAST  TECHNIQUE: Multidetector CT imaging of the abdomen and pelvis was performed following the standard protocol without IV contrast.  COMPARISON:  08/10/2011  FINDINGS: Mild dependent changes in the lung bases. Diffuse cardiac enlargement. Postoperative changes in the mediastinum. Calcification in the mitral valve annulus.  Evaluation of solid organs and vascular structures is limited without IV contrast material. Circumscribed low-attenuation lesion in segment 4 of the liver measuring 2.3 x 3.6 cm. This is unchanged since prior study and probably represents a cyst. Appearance of the gallbladder is nonspecific. There is is suggestion of mild gallbladder wall thickening and infiltration. Single tiny stone identified. Early changes of cholecystitis not excluded. Unenhanced appearance of the pancreas, spleen, kidneys, adrenal glands, and inferior vena cava are unremarkable. Calcification of the abdominal aorta without aneurysm. Low-attenuation lymph node between the aorta and IVC measures 12 mm in is likely reactive. Stomach, small bowel, and colon are decompressed. Scattered diverticula in the colon. No free air or free fluid in the abdomen. No abnormal retroperitoneal fluid collections.  Pelvis: Diverticulosis of the sigmoid colon without inflammatory change. Uterus appears surgically absent. No pelvic mass or lymphadenopathy. No free or loculated pelvic fluid collections. Appendix is not identified. Bladder wall is not thickened. Small left inguinal hernia containing fat. Degenerative changes in the  spine and hips.  IMPRESSION: Possible inflammatory changes around the gallbladder. Correlate clinically for suspicion of cholecystitis. Cyst in the liver. No acute process otherwise identified.   Electronically Signed   By: Lucienne Capers M.D.   On: 08/13/2014 20:24   Ct Head Wo Contrast  08/13/2014   CLINICAL DATA:  Rectal bleeding. Anti coagulation. Bruising along the back of the neck.  EXAM: CT HEAD  WITHOUT CONTRAST  CT CERVICAL SPINE WITHOUT CONTRAST  TECHNIQUE: Multidetector CT imaging of the head and cervical spine was performed following the standard protocol without intravenous contrast. Multiplanar CT image reconstructions of the cervical spine were also generated.  COMPARISON:  10/14/2009  FINDINGS: CT HEAD FINDINGS  The brainstem, cerebellum, cerebral peduncles, thalamus, basal ganglia, basilar cisterns, and ventricular system appear within normal limits. Periventricular white matter and corona radiata hypodensities favor chronic ischemic microvascular white matter disease. No intracranial hemorrhage, mass lesion, or acute CVA.  Mild chronic right sphenoid sinusitis. Right mastoid effusion. There is atherosclerotic calcification of the cavernous carotid arteries bilaterally.  CT CERVICAL SPINE FINDINGS  No cervical spine fracture or significant abnormal subluxation. No prevertebral soft tissue swelling or significant bony lesion observed. Clips are present in the left neck. No cervical spine  Clips are present in the left neck I do not observe a significant hematoma in the visualized portion of the neck, although please note that the entirety of the neck is not included. The spinous processes appear unremarkable.  IMPRESSION: 1. No acute intracranial findings or acute cervical spine findings. 2. Periventricular white matter and corona radiata hypodensities favor chronic ischemic microvascular white matter disease. 3. Right mastoid effusion with mild chronic right sphenoid sinusitis. 4. No significant hematoma is seen in the visualized portion of the the neck.   Electronically Signed   By: Van Clines M.D.   On: 08/13/2014 20:22   Ct Cervical Spine Wo Contrast  08/13/2014   CLINICAL DATA:  Rectal bleeding. Anti coagulation. Bruising along the back of the neck.  EXAM: CT HEAD WITHOUT CONTRAST  CT CERVICAL SPINE WITHOUT CONTRAST  TECHNIQUE: Multidetector CT imaging of the head and cervical spine  was performed following the standard protocol without intravenous contrast. Multiplanar CT image reconstructions of the cervical spine were also generated.  COMPARISON:  10/14/2009  FINDINGS: CT HEAD FINDINGS  The brainstem, cerebellum, cerebral peduncles, thalamus, basal ganglia, basilar cisterns, and ventricular system appear within normal limits. Periventricular white matter and corona radiata hypodensities favor chronic ischemic microvascular white matter disease. No intracranial hemorrhage, mass lesion, or acute CVA.  Mild chronic right sphenoid sinusitis. Right mastoid effusion. There is atherosclerotic calcification of the cavernous carotid arteries bilaterally.  CT CERVICAL SPINE FINDINGS  No cervical spine fracture or significant abnormal subluxation. No prevertebral soft tissue swelling or significant bony lesion observed. Clips are present in the left neck. No cervical spine  Clips are present in the left neck I do not observe a significant hematoma in the visualized portion of the neck, although please note that the entirety of the neck is not included. The spinous processes appear unremarkable.  IMPRESSION: 1. No acute intracranial findings or acute cervical spine findings. 2. Periventricular white matter and corona radiata hypodensities favor chronic ischemic microvascular white matter disease. 3. Right mastoid effusion with mild chronic right sphenoid sinusitis. 4. No significant hematoma is seen in the visualized portion of the the neck.   Electronically Signed   By: Van Clines M.D.   On: 08/13/2014 20:22   Nm Hepatobiliary Including  Gb  08/14/2014   CLINICAL DATA:  Abdominal pain.  Nausea.  Pancreatitis.  EXAM: NUCLEAR MEDICINE HEPATOBILIARY IMAGING  TECHNIQUE: Sequential images of the abdomen were obtained out to 60 minutes following intravenous administration of radiopharmaceutical.  RADIOPHARMACEUTICALS:  5.0 [place] OH-72B Choletec  COMPARISON:  None.  FINDINGS: Prompt  radiopharmaceutical uptake by the liver is seen. Liver is normal in appearance.  Prompt biliary excretion of activity is seen. Gallbladder activity is seen initially on the 20 minutes image. Biliary activity reaches the small bowel initially on the 25 min image.  IMPRESSION: Normal hepatobiliary scan. No evidence of cystic duct or biliary obstruction   Electronically Signed   By: Earle Gell M.D.   On: 08/14/2014 14:22   Dg Chest Port 1 View  08/13/2014   CLINICAL DATA:  Rectal bleeding. Dark colored urine for 3 days. Fall 10 days ago. Back pain.  EXAM: PORTABLE CHEST - 1 VIEW  COMPARISON:  06/25/2012  FINDINGS: Moderate enlargement of the cardiopericardial silhouette observed with indistinct pulmonary vasculature, cephalization of blood flow, and bilateral interstitial accentuation in the lungs. Dual lead pacer remains in place. Prior median sternotomy.  Atherosclerotic calcification of the aortic arch. Bandlike opacities in the retrocardiac region likely reflecting left lower lobe atelectasis. Prior blunting of the costophrenic angles appears improved although this may be positional. The overall degree of interstitial edema appears mildly increased.  IMPRESSION: 1. Moderate enlargement of the cardiopericardial silhouette with interstitial edema. Reduced conspicuity of prior pleural effusions. 2. Suspected bandlike atelectasis at the left lung base. 3. Dual lead pacer remains in place.   Electronically Signed   By: Van Clines M.D.   On: 08/13/2014 20:33   US Abdomen Limited Ruq  08/13/2014   CLINICAL DATA:  Abdominal pain. Recent CT with questionable inflammatory change about the gallbladder.  EXAM: US ABDOMEN LIMITED - RIGHT UPPER QUADRANT  COMPARISON:  CT abdomen/ pelvis earlier this day.  FINDINGS: Gallbladder:  Elongated spanning 11.2 cm. Contains dependent sludge and small echogenic stones. Normal gallbladder wall thickness of 2 mm. No pericholecystic fluid. No sonographic Murphy sign noted.   Common bile duct:  Diameter: 5 mm.  Liver:  Two hepatic cysts again seen, largest measures 3.5 x 2.1 x 2.9 cm. Otherwise within normal limits in parenchymal echogenicity.  IMPRESSION: 1. Distended gallbladder containing dependent sludge and small stones. There are no secondary sonographic findings of acute cholecystitis. Wall thickness is normal, there is a negative sonographic Murphy sign. No biliary dilatation. 2. Hepatic cysts.   Electronically Signed   By: Jeb Levering M.D.   On: 08/13/2014 22:49    Medications: . amiodarone  200 mg Oral Daily  . antiseptic oral rinse  7 mL Mouth Rinse q12n4p  . aspirin EC  81 mg Oral Daily  . chlorhexidine  15 mL Mouth Rinse BID  . diltiazem  180 mg Oral QPM  . ferrous sulfate  325 mg Oral Q breakfast  . fluticasone  2 spray Each Nare Daily  . folic acid  1 mg Oral Daily  . hydrocortisone  25 mg Rectal BID  . levalbuterol  0.315 mg Nebulization TID  . levothyroxine  100 mcg Oral QAC breakfast  . loratadine  10 mg Oral Daily  . meclizine  25 mg Oral QHS  . metoprolol  150 mg Oral BID  . sodium chloride  3 mL Intravenous Q12H  . Warfarin - Pharmacist Dosing Inpatient   Does not apply q1800    Assessment/Plan Falling with weakness Abdominal pain  Pancreatitis, vs gallbladder pancreatitis Cholelithiasis LFT's improving Acute renal failure CAD/prior stent/AF/Hx of CHF/PTVP/hypertrophic Cardiomyopathy  Supratheraputic INR On chronic home O2 Hypertension Hx of Stroke and Left Carotid Endarterectomy, RICA stenosis Hypertension Hyperlipidemia Arthritis DVT- INR 3.39 this AM  She is not on antibiotics   Plan:  I have ordered another another lipase, but on exam I would think her pancreatitis is pretty much resolved.  HIDA is negative and I would see how she does on clears.  Surgery is not a good option here.  Continue to watch LFT'S. Dr. Fuller Plan notes with pacer she is not a candidate for MRCP.  I think we all prefer to watch and see how she  does.     LOS: 2 days    Ferdinando Lodge 08/15/2014

## 2014-08-15 NOTE — Progress Notes (Signed)
CARE MANAGEMENT NOTE 08/15/2014  Patient:  [facility]   Account Number:  [id number]  Date Initiated:  08/15/2014  Documentation initiated by:  Karl Bales  Subjective/Objective Assessment:   Pt admitted with cco N, V with Pancreatitis     Action/Plan:   from home   Anticipated DC Date:  08/16/2014   Anticipated DC Plan:  HOME/SELF CARE         Choice offered to / List presented to:             Status of service:  In process, will continue to follow Medicare Important Message given?   (If response is "NO", the following Medicare IM given date fields will be blank) Date Medicare IM given:   Medicare IM given by:   Date Additional Medicare IM given:   Additional Medicare IM given by:    Discharge Disposition:    Per UR Regulation:  Reviewed for med. necessity/level of care/duration of stay  If discussed at [place] of Stay Meetings, dates discussed:    Comments:  08/15/14 MMcGibboney, RN, BSN Chart reviewed.

## 2014-08-15 NOTE — Progress Notes (Signed)
    Progress Note   Subjective  feels okay. No abdominal pain, just sore when palpated   Objective   Vital signs in last 24 hours: Temp:  [97.5 F (36.4 C)-97.7 F (36.5 C)] 97.5 F (36.4 C) (02/24 0526) Pulse Rate:  [60] 60 (02/24 0526) Resp:  [18] 18 (02/24 0526) BP: (128-155)/(59-71) 128/64 mmHg (02/24 0526) SpO2:  [97 %-100 %] 98 % (02/24 0930) Weight:  [182 lb 15.7 oz (83 kg)] 182 lb 15.7 oz (83 kg) (02/24 0500) Last BM Date: 08/13/14 General:    Pleasant white female in NAD Heart:  Regular rate and rhythm Abdomen:  Soft, nontender and nondistended. Normal bowel sounds. Neurologic:  Alert and oriented,  grossly normal neurologically. Psych:  Cooperative. Normal mood and affect.   Lab Results:  Recent Labs  08/13/14 1733 08/14/14 0010 08/15/14 0540  WBC 8.2 7.9 7.1  HGB 12.4 12.1 13.4  HCT 39.0 38.5 42.6  PLT 221 217 231   BMET  Recent Labs  08/13/14 1733 08/14/14 0507 08/15/14 0540  NA 137 136 140  K 4.4 4.1 4.9  CL 99 99 99  CO2 29 29 30   GLUCOSE 86 63* 55*  BUN 63* 59* 51*  CREATININE 2.98* 2.66* 1.96*  CALCIUM 8.4 8.1* 8.9   LFT  Recent Labs  08/15/14 0540  PROT 6.9  ALBUMIN 2.8*  AST 87*  ALT 137*  ALKPHOS 180*  BILITOT 2.3*   PT/INR  Recent Labs  08/14/14 1405 08/15/14 0540  LABPROT 35.5* 34.5*  INR 3.52* 3.39*      Assessment / Plan:    81 year old female with acute upper abdominal pain, elevated lipase but normal pancreas on non-contrast CTscan. Feeling better, may have been mild pancreatitis. ? Etiology not clear but CBD stone not totally excluded. Normal HIDA. Patient has pacemaker precluding MRCP. She is a poor endoscopic and surgical candidate. If this was biliary pancreatitis there is a chance it could recur and patient understands this. Plan is to monitor for now. She would like clears, will order.   2. AKI, numbers continue to improve.   3. Multiple medical problems as listed in PMH.  4. Chronic coumadin for afib.     LOS: 2 days   Carla Black  08/15/2014, 10:08 AM      Attending physician's note   I have taken an interval history, reviewed the chart and examined the patient. I agree with the Advanced Practitioner's note, impression and recommendations. Resolving pancreatitis, suspected biliary etiology. LFTs improving however they remain elevated. Choledocholithiasis is very possible however she is a poor ERCP/sedation candidate due to her multiple comorbidities including acute on chronic systolic CHF secondary to underlying hypertrophic cardiomyopathy, severe mitral regurgitation, severe pulmonary HTN, atrial fibrillation on chronic coumadin and AKI on CKD 3. Observe and monitor for now.    Carla Black. Fuller Plan, MD [facility]

## 2014-08-16 DIAGNOSIS — K858 Other acute pancreatitis: Secondary | ICD-10-CM

## 2014-08-16 LAB — CBC
HEMATOCRIT: 39.4 % (ref 36.0–46.0)
Hemoglobin: 12.2 g/dL (ref 12.0–15.0)
MCH: 30.3 pg (ref 26.0–34.0)
MCHC: 31 g/dL (ref 30.0–36.0)
MCV: 98 fL (ref 78.0–100.0)
PLATELETS: 254 10*3/uL (ref 150–400)
RBC: 4.02 MIL/uL (ref 3.87–5.11)
RDW: 16 % — ABNORMAL HIGH (ref 11.5–15.5)
WBC: 6.8 10*3/uL (ref 4.0–10.5)

## 2014-08-16 LAB — BASIC METABOLIC PANEL
ANION GAP: 6 (ref 5–15)
BUN: 40 mg/dL — AB (ref 6–23)
CALCIUM: 8.5 mg/dL (ref 8.4–10.5)
CO2: 31 mmol/L (ref 19–32)
CREATININE: 1.62 mg/dL — AB (ref 0.50–1.10)
Chloride: 104 mmol/L (ref 96–112)
GFR calc Af Amer: 33 mL/min — ABNORMAL LOW (ref >90)
GFR, EST NON AFRICAN AMERICAN: 28 mL/min — AB (ref >90)
GLUCOSE: 92 mg/dL (ref 70–99)
Potassium: 5 mmol/L (ref 3.5–5.1)
SODIUM: 141 mmol/L (ref 135–145)

## 2014-08-16 LAB — HEPATIC FUNCTION PANEL
ALK PHOS: 147 U/L — AB (ref 39–117)
ALT: 106 U/L — ABNORMAL HIGH (ref 0–35)
AST: 66 U/L — ABNORMAL HIGH (ref 0–37)
Albumin: 2.6 g/dL — ABNORMAL LOW (ref 3.5–5.2)
BILIRUBIN DIRECT: 0.8 mg/dL — AB (ref 0.0–0.5)
BILIRUBIN TOTAL: 1.6 mg/dL — AB (ref 0.3–1.2)
Indirect Bilirubin: 0.8 mg/dL (ref 0.3–0.9)
Total Protein: 6.3 g/dL (ref 6.0–8.3)

## 2014-08-16 LAB — PROTIME-INR
INR: 4.25 — ABNORMAL HIGH (ref 0.00–1.49)
PROTHROMBIN TIME: 41.2 s — AB (ref 11.6–15.2)

## 2014-08-16 MED ORDER — FUROSEMIDE 10 MG/ML IJ SOLN
20.0000 mg | Freq: Once | INTRAMUSCULAR | Status: AC
  Administered 2014-08-16: 20 mg via INTRAVENOUS
  Filled 2014-08-16: qty 2

## 2014-08-16 NOTE — Progress Notes (Signed)
ANTICOAGULATION CONSULT NOTE - Follow up  Pharmacy Consult for warfarin Indication: atrial fibrillation  Allergies  Allergen Reactions  . Pseudoephedrine Other (See Comments)    Insomnia (took 1 tablet and could not sleep for 3 days)  . Statins Other (See Comments)    myalgia  . Codeine Nausea Only and Other (See Comments)    dizziness  . Seldane [Terfenadine] Nausea Only and Other (See Comments)    dizziness    Patient Measurements: Height: 5\' 3"  (160 cm) Weight: 180 lb (81.647 kg) IBW/kg (Calculated) : 52.4   Vital Signs: Temp: 97.6 F (36.4 C) (02/25 0428) Temp Source: Oral (02/25 0428) BP: 154/54 mmHg (02/25 0922) Pulse Rate: 60 (02/25 0922)  Labs:  Recent Labs  08/14/14 0010 08/14/14 0507 08/14/14 1405 08/15/14 0540 08/16/14 0500  HGB 12.1  --   --  13.4 12.2  HCT 38.5  --   --  42.6 39.4  PLT 217  --   --  231 254  LABPROT  --   --  35.5* 34.5* 41.2*  INR  --   --  3.52* 3.39* 4.25*  CREATININE  --  2.66*  --  1.96* 1.62*  TROPONINI 0.04* 0.03 0.03  --   --     Estimated Creatinine Clearance: 26.2 mL/min (by C-G formula based on Cr of 1.62).   Medications:  Scheduled:  . amiodarone  200 mg Oral Daily  . antiseptic oral rinse  7 mL Mouth Rinse q12n4p  . aspirin EC  81 mg Oral Daily  . chlorhexidine  15 mL Mouth Rinse BID  . diltiazem  180 mg Oral QPM  . ferrous sulfate  325 mg Oral Q breakfast  . fluticasone  2 spray Each Nare Daily  . folic acid  1 mg Oral Daily  . hydrocortisone  25 mg Rectal BID  . levalbuterol  0.315 mg Nebulization TID  . levothyroxine  100 mcg Oral QAC breakfast  . loratadine  10 mg Oral Daily  . meclizine  25 mg Oral QHS  . metoprolol  150 mg Oral BID  . sodium chloride  3 mL Intravenous Q12H  . Warfarin - Pharmacist Dosing Inpatient   Does not apply q1800    Assessment: 81 y.o. female with a significant cardiac history on chronic Coumadin who presented to ED yesterday with acute abdominal pain, weakness and a fall.  She has chronic, intermittent loose stools but yesterday had several episodes of diarrhea followed by rectal bleeding. Her lipase was 600, no findings of pancreatitis on non-contrast CT scan. LFTs were elevated in a mixed pattern. Ultrasound reveals distended gallbladder with small gallstones, no CBD dilation.  Pharmacy consulted to manage warfarin.  -Per last anti-coag note on 1/29 warfarin dose is 2.5mg  daily except 1.25mg  on Mon and Fri. -pt has had poor appetite for several months   Significant events: 2/22-24: INR supratherapeutic. No Coumadin.  Today, 2/25: INR 4.25 CBC wnl. SCr elevated but improving. NPO except sips with meds, ice chips. Amiodarone and aspirin from home continue.  Goal of Therapy:  INR 2-3   Plan:  Cont to hold Coumadin.  Daily INR.  Romeo Rabon, PharmD, pager [phone]. 08/16/2014,12:44 PM.

## 2014-08-16 NOTE — Progress Notes (Signed)
[facility] Progress Note  Subjective:   Ate oatmeal this morning and developed epigastric abd pain afterwards. No vomiting.  On commode, passing some gas.   Objective:  Vital signs in last 24 hours: Temp:  [97.6 F (36.4 C)-98.2 F (36.8 C)] 97.6 F (36.4 C) (02/25 0428) Pulse Rate:  [58-62] 60 (02/25 0922) Resp:  [18-19] 19 (02/25 0428) BP: (140-154)/(48-54) 154/54 mmHg (02/25 0922) SpO2:  [81 %-99 %] 96 % (02/25 0856) Weight:  [180 lb (81.647 kg)] 180 lb (81.647 kg) (02/24 2108) Last BM Date: 08/15/14 General:   Alert,  Well-developed,    in NAD Heart:  Regular rate and rhythm; no murmurs Pulm;lungs clear Abdomen:  Soft, tender to palpation LUQ, nondistended. Normal bowel sounds, without guarding, and without rebound.   Extremities:  Without edema. Neurologic:  Alert and  oriented x4;  grossly normal neurologically. Psych:  Alert and cooperative. Normal mood and affect.  Intake/Output from previous day: 02/24 0701 - 02/25 0700 In: 1301 [P.O.:1080; I.V.:221] Out: 350 [Urine:350] Intake/Output this shift: Total I/O In: -  Out: 100 [Urine:100]  Lab Results:  Recent Labs  08/14/14 0010 08/15/14 0540 08/16/14 0500  WBC 7.9 7.1 6.8  HGB 12.1 13.4 12.2  HCT 38.5 42.6 39.4  PLT 217 231 254   BMET  Recent Labs  08/14/14 0507 08/15/14 0540 08/16/14 0500  NA 136 140 141  K 4.1 4.9 5.0  CL 99 99 104  CO2 _0 GLUCOSE 63* 55* 92  BUN 59* 51* 40*  CREATININE 2.66* 1.96* 1.62*  CALCIUM 8.1* 8.9 8.5   LFT  Recent Labs  08/16/14 0500  PROT 6.3  ALBUMIN 2.6*  AST 66*  ALT 106*  ALKPHOS 147*  BILITOT 1.6*  BILIDIR 0.8*  IBILI 0.8   PT/INR  Recent Labs  08/15/14 0540 08/16/14 0500  LABPROT 34.5* 41.2*  INR 3.39* 4.25*      Nm Hepatobiliary Including Gb  08/14/2014   CLINICAL DATA:  Abdominal pain.  Nausea.  Pancreatitis.  EXAM: NUCLEAR MEDICINE HEPATOBILIARY IMAGING  TECHNIQUE: Sequential images of the abdomen were  obtained out to 60 minutes following intravenous administration of radiopharmaceutical.  RADIOPHARMACEUTICALS:  5.0 [employer] XQ-11H Choletec  COMPARISON:  None.  FINDINGS: Prompt radiopharmaceutical uptake by the liver is seen. Liver is normal in appearance.  Prompt biliary excretion of activity is seen. Gallbladder activity is seen initially on the 20 minutes image. Biliary activity reaches the small bowel initially on the 25 min image.  IMPRESSION: Normal hepatobiliary scan. No evidence of cystic duct or biliary obstruction   Electronically Signed   By: Earle Gell M.D.   On: 08/14/2014 14:22    ASSESSMENT/PLAN:   81 year old female with acute upper abdominal pain, elevated lipase but normal pancreas on non-contrast CTscan. Feeling better, may have been mild pancreatitis. ? Etiology not clear but CBD stone not totally excluded. Normal HIDA. Patient has pacemaker precluding MRCP. She is a poor endoscopic and surgical candidate. If this was biliary pancreatitis there is a chance it could recur and patient understands this. Plan is to monitor for now. WBC nl. Alk phos 147 from 180, transaminases improving, t bili 0.8, d bili 0.8.  Will place back on clear liquids as she had apin after oatmeal this a.m.    LOS: 3 days   Carla Black, Carla Black 08/16/2014, Pager [phone]    Attending physician's note   I have taken an interval history, reviewed the chart and examined  the patient. I agree with the Advanced Practitioner's note, impression and recommendations. LFTs improving. Will continue to monitor.   [name]. Fuller Plan, MD [facility]

## 2014-08-16 NOTE — Progress Notes (Signed)
Subjective: She did well with the clear liquids, only pain in in LLQ and that is when you push on her abdomen.  She says her breathing is better also.  Stool was very liquid in consistency. She denies any issues with constipation prior to admit.  Objective: Vital signs in last 24 hours: Temp:  [97.6 F (36.4 C)-98.2 F (36.8 C)] 97.6 F (36.4 C) (02/25 0428) Pulse Rate:  [58-62] 62 (02/25 0428) Resp:  [18-19] 19 (02/25 0428) BP: (140-152)/(48-52) 152/49 mmHg (02/25 0428) SpO2:  [81 %-99 %] 99 % (02/25 0428) Weight:  [81.647 kg (180 lb)] 81.647 kg (180 lb) (02/24 2108) Last BM Date: 08/15/14 1080 PO + BM  350 urine recorded Afebrile, VSS Creatinine is improving. LFT's improving WBC remains normal  Intake/Output from previous day: 02/24 0701 - 02/25 0700 In: 1301 [P.O.:1080; I.V.:221] Out: 350 [Urine:350] Intake/Output this shift: Total I/O In: -  Out: 100 [Urine:100]  General appearance: alert, cooperative and no distress GI: soft, non tender, + BS, + BM, only pain is on palpation LLQ.  Lab Results:   Recent Labs  08/15/14 0540 08/16/14 0500  WBC 7.1 6.8  HGB 13.4 12.2  HCT 42.6 39.4  PLT 231 254    BMET  Recent Labs  08/15/14 0540 08/16/14 0500  NA 140 141  K 4.9 5.0  CL 99 104  CO2 30 31  GLUCOSE 55* 92  BUN 51* 40*  CREATININE 1.96* 1.62*  CALCIUM 8.9 8.5   PT/INR  Recent Labs  08/15/14 0540 08/16/14 0500  LABPROT 34.5* 41.2*  INR 3.39* 4.25*     Recent Labs Lab 08/13/14 1733 08/14/14 0507 08/15/14 0540 08/16/14 0500  AST 182* 110* 87* 66*  ALT 199* 147* 137* 106*  ALKPHOS 207* 172* 180* 147*  BILITOT 1.7* 1.5* 2.3* 1.6*  PROT 7.1 6.1 6.9 6.3  ALBUMIN 3.1* 2.5* 2.8* 2.6*     Lipase     Component Value Date/Time   LIPASE 145* 08/15/2014 0540     Studies/Results: Nm Hepatobiliary Including Gb  08/14/2014   CLINICAL DATA:  Abdominal pain.  Nausea.  Pancreatitis.  EXAM: NUCLEAR MEDICINE HEPATOBILIARY IMAGING  TECHNIQUE:  Sequential images of the abdomen were obtained out to 60 minutes following intravenous administration of radiopharmaceutical.  RADIOPHARMACEUTICALS:  5.0 [place] [employer]  COMPARISON:  None.  FINDINGS: Prompt radiopharmaceutical uptake by the liver is seen. Liver is normal in appearance.  Prompt biliary excretion of activity is seen. Gallbladder activity is seen initially on the 20 minutes image. Biliary activity reaches the small bowel initially on the 25 min image.  IMPRESSION: Normal hepatobiliary scan. No evidence of cystic duct or biliary obstruction   Electronically Signed   By: Earle Gell M.D.   On: 08/14/2014 14:22    Medications: . amiodarone  200 mg Oral Daily  . antiseptic oral rinse  7 mL Mouth Rinse q12n4p  . aspirin EC  81 mg Oral Daily  . chlorhexidine  15 mL Mouth Rinse BID  . diltiazem  180 mg Oral QPM  . ferrous sulfate  325 mg Oral Q breakfast  . fluticasone  2 spray Each Nare Daily  . folic acid  1 mg Oral Daily  . hydrocortisone  25 mg Rectal BID  . levalbuterol  0.315 mg Nebulization TID  . levothyroxine  100 mcg Oral QAC breakfast  . loratadine  10 mg Oral Daily  . meclizine  25 mg Oral QHS  . metoprolol  150 mg Oral BID  .  sodium chloride  3 mL Intravenous Q12H  . Warfarin - Pharmacist Dosing Inpatient   Does not apply q1800    Assessment/Plan Falling with weakness  Abdominal pain Pancreatitis, vs gallbladder pancreatitis Cholelithiasis LFT's improving Acute renal failure CAD/prior stent/AF/Hx of CHF/PTVP/hypertrophic Cardiomyopathy  Supratheraputic INR On chronic home O2 Hypertension Hx of Stroke and Left Carotid Endarterectomy, RICA stenosis Hypertension Hyperlipidemia Arthritis DVT- INR 4.25 this AM She is not on antibiotics   Plan:  I would advance her diet and give her more time to recover.  Medical management would be the recommendation at this point.  Call if there is a change and we can be of assistance.    LOS: 3 days     Aryelle Figg 08/16/2014

## 2014-08-16 NOTE — Progress Notes (Signed)
    Subjective:  Feels a little stronger today. Tolerated clears yesterday. No chest pain or shortness of breath.  Objective:  Vital Signs in the last 24 hours: Temp:  [97.6 F (36.4 C)-98.2 F (36.8 C)] 97.6 F (36.4 C) (02/25 0428) Pulse Rate:  [58-62] 62 (02/25 0428) Resp:  [18-19] 19 (02/25 0428) BP: (140-152)/(48-52) 152/49 mmHg (02/25 0428) SpO2:  [81 %-99 %] 99 % (02/25 0428) Weight:  [180 lb (81.647 kg)] 180 lb (81.647 kg) (02/24 2108)  Intake/Output from previous day: 02/24 0701 - 02/25 0700 In: 1301 [P.O.:1080; I.V.:221] Out: 350 [Urine:350]  Physical Exam: Pt is alert and oriented, pleasant elderly woman in NAD HEENT: normal Neck: JVP - elevated, carotids 2+= without bruits Lungs: Rales bilateral bases CV: RRR without murmur or gallop Abd: soft, NT, Positive BS, no hepatomegaly Ext: 1+ pedal edema, no pretibial edema, distal pulses intact and equal Skin: warm/dry no rash   Lab Results:  Recent Labs  08/15/14 0540 08/16/14 0500  WBC 7.1 6.8  HGB 13.4 12.2  PLT 231 254    Recent Labs  08/15/14 0540 08/16/14 0500  NA 140 141  K 4.9 5.0  CL 99 104  CO2 30 31  GLUCOSE 55* 92  BUN 51* 40*  CREATININE 1.96* 1.62*    Recent Labs  08/14/14 0507 08/14/14 1405  TROPONINI 0.03 0.03   Tele: Ventricular paced rhythm  Assessment/Plan:  1. Acute on chronic systolic CHF, secondary to underlying hypertrophic cardiomyopathy 2. Severe mitral regurgitation 3. Severe pulmonary HTN 4. Acute pancreatitis 5. Atrial fibrillation 6. AKI on CKD 3 7. Paroxysmal AFib, currently paced  The patient is clinically improving. She does have clinical evidence of volume overload with jugular venous distention and bibasilar rales. She is recovering from acute kidney injury. Diet to be advanced today. INR remains supratherapeutic even with holding warfarin. Would stop IV fluids today, observe to see if she tolerates orals, and will start low-dose IV Lasix. Follow-up  metabolic panel tomorrow morning.  Sherren Mocha, M.D. 08/16/2014, 8:56 AM

## 2014-08-16 NOTE — Progress Notes (Signed)
PROGRESS NOTE  Carla Black JGO:[id number] DOB: 07/27/1927 DOA: 08/13/2014 PCP: Leandrew Koyanagi, MD  HPI: Carla Black is a 81 y.o. female with Past medical history of hypertension, dyslipidemia, HOCM, chronic combined CHF, chronic hypoxic respiratory failure, pacemaker implant, GERD, A. fib, peripheral vascular disease. The patient presented with complaints of abdominal pain and a fall, found to have acute pancreatitis, cholelithiasis, worsening of her CHF.  Subjective / 24 H Interval events - She was feeling better, her diet was advanced, however she ate some butter and complained of abdominal pain   Assessment/Plan: Principal Problem:   Pancreatitis, acute Active Problems:   Abdominal pain   AKI (acute kidney injury)   Elevated LFTs   Acute on chronic combined systolic and diastolic heart failure   Supratherapeutic INR   BRBPR (bright red blood per rectum)   Abnormal gall bladder diagnostic imaging   Acute pancreatitis   Acute kidney injury  Acute pancreatitis / abdominal pain / poor po intake  - possibly related to cholelithiasis - general surgery/GI consulted, appreciate input - unfortunately aggressive hydration is difficult given heart failure, was on NS at 50 cc for 1.5 days, discontinue today as per cardiology  - she is not hemoconcentrated - She seems to be resolving her pancreatitis on her own.   HOCM / Mitral regurgitation / acute on chronic diastolic heart failure - repeated 2D echo with severe LVH, EF 60-65%, aortic stenosis/regurgitation, mitral valve regurgitation - stop fluids today, needs diuresis tomorrow  A fib  - now pacemaker dependent - INR high, per pharmacy  Supratherapeutic INR  - no evidence of ongoing bleeding  Bright red blood per rectum - intermittently, her Hb is stable, ?hemorrhoids per GI  Acute on chronic renal failure - CKD stage III - likely due to dehydration, improved with fluids - continue to closely  monitor  Elevated LFTs with elevated bilirubin - no CBD dilatation, GI following - AST/ALT improving, bilirubin increasing but may lag behind   Diet: Diet clear liquid Fluids: none  DVT Prophylaxis: Coumadin  Code Status: Full Code Family Communication: d/w patient Disposition Plan: inpatient  Consultants:  General Surgery   GI  Cardiology   Procedures:  None    Antibiotics  Anti-infectives    None       Studies  No results found.  Objective  Filed Vitals:   08/16/14 0856 08/16/14 0922 08/16/14 1336 08/16/14 1458  BP:  154/54 128/47   Pulse:  60 59   Temp:   97.6 F (36.4 C)   TempSrc:   Oral   Resp:   18   Height:      Weight:      SpO2: 96%  93% 95%    Intake/Output Summary (Last 24 hours) at 08/16/14 1742 Last data filed at 08/16/14 1337  Gross per 24 hour  Intake    221 ml  Output    800 ml  Net   -579 ml   Filed Weights   08/14/14 0332 08/15/14 0500 08/15/14 2108  Weight: 83.5 kg (184 lb 1.4 oz) 83 kg (182 lb 15.7 oz) 81.647 kg (180 lb)    Exam:  General:  NAD  HEENT: no scleral icterus  Cardiovascular: RRR  Respiratory: bibasilar crackles, no wheezing  Abdomen: soft, tender in the epigastric area, BS +  MSK/Extremities: 2+ pitting LE edema  Skin: no rashes  Neuro: non focal   Data Reviewed: Basic Metabolic Panel:  Recent Labs Lab 08/13/14 1733 08/14/14 0507 08/15/14  0540 08/16/14 0500  NA 137 136 140 141  K 4.4 4.1 4.9 5.0  CL 99 99 99 104  CO2 29 29 30 31   GLUCOSE 86 63* 55* 92  BUN 63* 59* 51* 40*  CREATININE 2.98* 2.66* 1.96* 1.62*  CALCIUM 8.4 8.1* 8.9 8.5   Liver Function Tests:  Recent Labs Lab 08/13/14 1733 08/14/14 0507 08/15/14 0540 08/16/14 0500  AST 182* 110* 87* 66*  ALT 199* 147* 137* 106*  ALKPHOS 207* 172* 180* 147*  BILITOT 1.7* 1.5* 2.3* 1.6*  PROT 7.1 6.1 6.9 6.3  ALBUMIN 3.1* 2.5* 2.8* 2.6*    Recent Labs Lab 08/13/14 1733 08/14/14 0507 08/15/14 0540  LIPASE 606* 307*  145*   CBC:  Recent Labs Lab 08/13/14 1733 08/14/14 0010 08/15/14 0540 08/16/14 0500  WBC 8.2 7.9 7.1 6.8  NEUTROABS 6.9 5.9  --   --   HGB 12.4 12.1 13.4 12.2  HCT 39.0 38.5 42.6 39.4  MCV 95.8 95.8 97.9 98.0  PLT 221 217 231 254   Cardiac Enzymes:  Recent Labs Lab 08/13/14 1733 08/14/14 0010 08/14/14 0507 08/14/14 1405  TROPONINI 0.05* 0.04* 0.03 0.03   BNP (last 3 results)  Recent Labs  08/13/14 1733  BNP 1326.6*   Scheduled Meds: . amiodarone  200 mg Oral Daily  . antiseptic oral rinse  7 mL Mouth Rinse q12n4p  . aspirin EC  81 mg Oral Daily  . chlorhexidine  15 mL Mouth Rinse BID  . diltiazem  180 mg Oral QPM  . ferrous sulfate  325 mg Oral Q breakfast  . fluticasone  2 spray Each Nare Daily  . folic acid  1 mg Oral Daily  . hydrocortisone  25 mg Rectal BID  . levalbuterol  0.315 mg Nebulization TID  . levothyroxine  100 mcg Oral QAC breakfast  . loratadine  10 mg Oral Daily  . meclizine  25 mg Oral QHS  . metoprolol  150 mg Oral BID  . sodium chloride  3 mL Intravenous Q12H  . Warfarin - Pharmacist Dosing Inpatient   Does not apply q1800   Continuous Infusions:    Marzetta Board, MD [facility] Pager [phone]. If 7 PM - 7 AM, please contact night-coverage at [email or web address], password [facility] 08/16/2014, 5:42 PM  LOS: 3 days

## 2014-08-17 LAB — HEPATIC FUNCTION PANEL
ALT: 99 U/L — AB (ref 0–35)
AST: 67 U/L — AB (ref 0–37)
Albumin: 2.7 g/dL — ABNORMAL LOW (ref 3.5–5.2)
Alkaline Phosphatase: 146 U/L — ABNORMAL HIGH (ref 39–117)
BILIRUBIN DIRECT: 0.8 mg/dL — AB (ref 0.0–0.5)
BILIRUBIN INDIRECT: 0.8 mg/dL (ref 0.3–0.9)
BILIRUBIN TOTAL: 1.6 mg/dL — AB (ref 0.3–1.2)
Total Protein: 6.5 g/dL (ref 6.0–8.3)

## 2014-08-17 LAB — BASIC METABOLIC PANEL
Anion gap: 7 (ref 5–15)
BUN: 29 mg/dL — AB (ref 6–23)
CALCIUM: 8.4 mg/dL (ref 8.4–10.5)
CO2: 30 mmol/L (ref 19–32)
Chloride: 101 mmol/L (ref 96–112)
Creatinine, Ser: 1.38 mg/dL — ABNORMAL HIGH (ref 0.50–1.10)
GFR calc Af Amer: 39 mL/min — ABNORMAL LOW (ref >90)
GFR, EST NON AFRICAN AMERICAN: 34 mL/min — AB (ref >90)
Glucose, Bld: 96 mg/dL (ref 70–99)
Potassium: 4.2 mmol/L (ref 3.5–5.1)
Sodium: 138 mmol/L (ref 135–145)

## 2014-08-17 LAB — PROTIME-INR
INR: 3.2 — AB (ref 0.00–1.49)
PROTHROMBIN TIME: 33 s — AB (ref 11.6–15.2)

## 2014-08-17 LAB — LIPASE, BLOOD: Lipase: 65 U/L — ABNORMAL HIGH (ref 11–59)

## 2014-08-17 MED ORDER — FUROSEMIDE 10 MG/ML IJ SOLN
20.0000 mg | Freq: Once | INTRAMUSCULAR | Status: AC
  Administered 2014-08-17: 20 mg via INTRAVENOUS
  Filled 2014-08-17: qty 2

## 2014-08-17 MED ORDER — WARFARIN SODIUM 1 MG PO TABS
1.0000 mg | ORAL_TABLET | Freq: Once | ORAL | Status: AC
  Administered 2014-08-17: 1 mg via ORAL
  Filled 2014-08-17: qty 1

## 2014-08-17 NOTE — Progress Notes (Signed)
ANTICOAGULATION CONSULT NOTE - Follow up  Pharmacy Consult for warfarin Indication: atrial fibrillation  Allergies  Allergen Reactions  . Pseudoephedrine Other (See Comments)    Insomnia (took 1 tablet and could not sleep for 3 days)  . Statins Other (See Comments)    myalgia  . Codeine Nausea Only and Other (See Comments)    dizziness  . Seldane [Terfenadine] Nausea Only and Other (See Comments)    dizziness    Patient Measurements: Height: 5\' 3"  (160 cm) Weight: 180 lb 6.4 oz (81.829 kg) IBW/kg (Calculated) : 52.4   Vital Signs: Temp: 97.4 F (36.3 C) (02/26 0616) Temp Source: Oral (02/26 0616) BP: 146/48 mmHg (02/26 0616) Pulse Rate: 60 (02/26 0616)  Labs:  Recent Labs  08/14/14 1405 08/15/14 0540 08/16/14 0500 08/17/14 0515  HGB  --  13.4 12.2  --   HCT  --  42.6 39.4  --   PLT  --  231 254  --   LABPROT 35.5* 34.5* 41.2* 33.0*  INR 3.52* 3.39* 4.25* 3.20*  CREATININE  --  1.96* 1.62* 1.38*  TROPONINI 0.03  --   --   --     Estimated Creatinine Clearance: 30.8 mL/min (by C-G formula based on Cr of 1.38).   Medications:  Scheduled:  . amiodarone  200 mg Oral Daily  . antiseptic oral rinse  7 mL Mouth Rinse q12n4p  . aspirin EC  81 mg Oral Daily  . chlorhexidine  15 mL Mouth Rinse BID  . diltiazem  180 mg Oral QPM  . ferrous sulfate  325 mg Oral Q breakfast  . fluticasone  2 spray Each Nare Daily  . folic acid  1 mg Oral Daily  . hydrocortisone  25 mg Rectal BID  . levalbuterol  0.315 mg Nebulization TID  . levothyroxine  100 mcg Oral QAC breakfast  . loratadine  10 mg Oral Daily  . meclizine  25 mg Oral QHS  . metoprolol  150 mg Oral BID  . sodium chloride  3 mL Intravenous Q12H  . Warfarin - Pharmacist Dosing Inpatient   Does not apply q1800    Assessment: 81 y.o. female with a significant cardiac history on chronic Coumadin who presented to ED yesterday with acute abdominal pain, weakness and a fall. She has chronic, intermittent loose  stools but yesterday had several episodes of diarrhea followed by rectal bleeding. Her lipase was 600, no findings of pancreatitis on non-contrast CT scan. LFTs were elevated in a mixed pattern. Ultrasound reveals distended gallbladder with small gallstones, no CBD dilation.  Pharmacy consulted to manage warfarin.  -Per last anti-coag note on 1/29 warfarin dose is 2.5mg  daily except 1.25mg  on Mon and Fri. -pt has had poor appetite for several months   Significant events: 2/22-25: INR supratherapeutic. No Coumadin.  Today, 08/17/2014: INR improving now, still slightly supratherapeutic.  2/25 - CBC wnl. No bleeding reported/documented. SCr elevated but improving. Full liquid diet, tolerating. Amiodarone and aspirin from home continue.  Goal of Therapy:  INR 2-3   Plan:  Give small dose of Coumadin, 1mg  to keep INR from falling too quickly.  With improved PO intake, she should be much less sensitive now. Daily INR.  Romeo Rabon, PharmD, pager [phone]. 08/17/2014,12:36 PM.

## 2014-08-17 NOTE — Progress Notes (Signed)
     [facility] Progress Note  Subjective:   On clear liquids. No nausea or vomiting. Last BM 2 days ago ( but has not had much to eat). No abdominal pain, but is sore when palpated.   Objective:  Vital signs in last 24 hours: Temp:  [97.4 F (36.3 C)-98.4 F (36.9 C)] 97.4 F (36.3 C) (02/26 0616) Pulse Rate:  [58-60] 60 (02/26 0616) Resp:  [18] 18 (02/26 0616) BP: (128-154)/(47-81) 146/48 mmHg (02/26 0616) SpO2:  [93 %-99 %] 94 % (02/26 0753) Weight:  [180 lb 6.4 oz (81.829 kg)] 180 lb 6.4 oz (81.829 kg) (02/26 0504) Last BM Date: 08/16/14 General:   Alert, well-developed, in NAD Heart:  Regular rate and rhythm; no murmurs Pulm: lungs clear Abdomen: Soft, tender to palpation LUQ, nondistended. Normal bowel sounds, without guarding, and without rebound.    Extremities:  Without edema. Neurologic:  Alert and  oriented x4;  grossly normal neurologically. Psych:  Alert and cooperative. Normal mood and affect.  Intake/Output from previous day: 02/25 0701 - 02/26 0700 In: 240 [P.O.:240] Out: 1400 [Urine:1400] Intake/Output this shift:    Lab Results:  Recent Labs  08/15/14 0540 08/16/14 0500  WBC 7.1 6.8  HGB 13.4 12.2  HCT 42.6 39.4  PLT 231 254   BMET  Recent Labs  08/15/14 0540 08/16/14 0500 08/17/14 0515  NA 140 141 138  K 4.9 5.0 4.2  CL 99 104 101  CO2 30 31 30   GLUCOSE 55* 92 96  BUN 51* 40* 29*  CREATININE 1.96* 1.62* 1.38*  CALCIUM 8.9 8.5 8.4   LFT  Recent Labs  08/17/14 0515  PROT 6.5  ALBUMIN 2.7*  AST 67*  ALT 99*  ALKPHOS 146*  BILITOT 1.6*  BILIDIR 0.8*  IBILI 0.8  lipase 65 this morning. PT/INR  Recent Labs  08/16/14 0500 08/17/14 0515  LABPROT 41.2* 33.0*  INR 4.25* 3.20*     ASSESSMENT/PLAN:   81 yo female with resolving pancreatitis, suspected biliary cause. LFTs steadily improving. She likely passed a CBD stone. A retained CBD stone is possible however she is a poor ERCP/sedation candidate due to  her multiple comorbidities including acute on chronic systolic CHF secondary to underlying hypertrophic cardiomyopathy, severe mitral regurgitation, severe pulmonary HTN, atrial fibrillation on chronic coumadin and AKI on CKD 3. Continue conservative mgmt. Advance diet as tolerated.     LOS: 4 days   Hvozdovic, Deloris Ping 08/17/2014, Pager [phone]     Attending physician's note   I have taken an interval history, reviewed the chart and examined the patient. I agree with the Advanced Practitioner's note, impression and recommendations. Resolving biliary pancreatitis. Suspect she passed a CBD stone however a retained CBD stone is not excluded. Poor candidate for ERCP/anesthesia so observation is current recommendation. Advance diet as tolerated.   [name]. Fuller Plan, MD [facility]

## 2014-08-17 NOTE — Evaluation (Signed)
Physical Therapy Evaluation Patient Details Name: JASHIRA COTUGNO MRN: [id number] DOB: 10/17/1927 Today's Date: 08/17/2014   History of Present Illness  81 yo female admitted with acute pancreatitis, fall. hx of HTn, CHF, pacemaker, A fib, PVD, CVA. Pt is from [facility].   Clinical Impression  On eval, pt required Min assist for mobility-able to ambulate ~75'x1 then another 75' with seated rest break between walks. Pt c/o lightheadedness, general fatigue/weakness while ambulating. BP 135/52 once seated on EOB. Pt is from [facility]. Based on eval, unsure if pt will be able to safely and efficiently manage at home without some assistance- (also depends on how much assistance husband can provide). May need to consider ST rehab if overall strength and mobility do not improve. Recommend ambulation with nursing supervision/assist over weekend as able.     Follow Up Recommendations Home health PT; 24 hour supervision/assist (May need to consider ST rehab depending on progress and if pt is agreeable to SNF if needed.    Equipment Recommendations  None recommended by PT    Recommendations for Other Services OT consult     Precautions / Restrictions Precautions Precautions: Fall Precaution Comments: monitor vitals Restrictions Weight Bearing Restrictions: No      Mobility  Bed Mobility Overal bed mobility: Needs Assistance Bed Mobility: Supine to Sit;Sit to Supine     Supine to sit: Min assist;HOB elevated Sit to supine: Min assist;HOB elevated   General bed mobility comments: Assist for LEs. Increased time.   Transfers Overall transfer level: Needs assistance Equipment used: Rolling walker (2 wheeled) Transfers: Sit to/from [facility] Sit to Stand: Min assist Stand pivot transfers: Min assist       General transfer comment: Assist to rise, stabilize, control descent. VCS safety, technique, hand placement.   Ambulation/Gait Ambulation/Gait assistance: Min  assist Ambulation Distance (Feet): 75 Feet (75'x1, 40'x1) Assistive device: 4-wheeled walker Gait Pattern/deviations: Step-through pattern;Decreased stride length     General Gait Details: slow gait speed. assist to stabilize intermittently. seated rest break needed. Pt c/o fatigue, lightheadedness, weakness during ambulation. Remained on 3L O2.   Stairs            Wheelchair Mobility    Modified Rankin (Stroke Patients Only)       Balance Overall balance assessment: Needs assistance;History of Falls         Standing balance support: Bilateral upper extremity supported;During functional activity Standing balance-Leahy Scale: Poor                               Pertinent Vitals/Pain Pain Assessment: No/denies pain    Home Living Family/patient expects to be discharged to:: Private residence Living Arrangements: Spouse/significant other   Type of Home: Independent living facility Home Access: Level entry     Home Layout: One level Home Equipment: Walker - 4 wheels;Grab bars - toilet;Grab bars - tub/shower      Prior Function Level of Independence: Independent with assistive device(s)               Hand Dominance        Extremity/Trunk Assessment   Upper Extremity Assessment: Generalized weakness           Lower Extremity Assessment: Generalized weakness      Cervical / Trunk Assessment: Kyphotic  Communication   Communication: No difficulties  Cognition Arousal/Alertness: Awake/alert Behavior During Therapy: WFL for tasks assessed/performed Overall Cognitive Status: Within Functional Limits for  tasks assessed                      General Comments      Exercises        Assessment/Plan    PT Assessment Patient needs continued PT services  PT Diagnosis Difficulty walking;Generalized weakness   PT Problem List Decreased strength;Decreased range of motion;Decreased activity tolerance;Decreased balance;Decreased  mobility;Decreased knowledge of use of DME  PT Treatment Interventions DME instruction;Gait training;Functional mobility training;Therapeutic activities;Therapeutic exercise;Patient/family education;Balance training   PT Goals (Current goals can be found in the Care Plan section) Acute Rehab PT Goals Patient Stated Goal: home soon PT Goal Formulation: With patient Time For Goal Achievement: 08/31/14 Potential to Achieve Goals: Good    Frequency Min 3X/week   Barriers to discharge        Co-evaluation               End of Session Equipment Utilized During Treatment: Gait belt;Oxygen Activity Tolerance: Patient limited by fatigue Patient left: in bed;with call bell/phone within reach           Time: 1005-1100 PT Time Calculation (min) (ACUTE ONLY): 55 min   Charges:   PT Evaluation $Initial PT Evaluation Tier I: 1 Procedure PT Treatments $Gait Training: 8-22 mins $Therapeutic Activity: 8-22 mins   PT G Codes:        Weston Anna, MPT Pager: [phone]

## 2014-08-17 NOTE — Progress Notes (Signed)
    Subjective:  Feels weak. No chest pain or shortness of breath at rest.  Objective:  Vital Signs in the last 24 hours: Temp:  [97.4 F (36.3 C)-98.4 F (36.9 C)] 97.4 F (36.3 C) (02/26 0616) Pulse Rate:  [58-60] 60 (02/26 0616) Resp:  [18] 18 (02/26 0616) BP: (128-146)/(47-81) 146/48 mmHg (02/26 0616) SpO2:  [93 %-99 %] 94 % (02/26 0753) Weight:  [180 lb 6.4 oz (81.829 kg)] 180 lb 6.4 oz (81.829 kg) (02/26 0504)  Intake/Output from previous day: 02/25 0701 - 02/26 0700 In: 240 [P.O.:240] Out: 1400 [Urine:1400]  Physical Exam: Pt is alert and oriented, elderly woman in NAD HEENT: normal Neck: JVP - remains elevated Lungs: diminished in bases bilaterally CV: RRR without murmur or gallop Abd: soft, NT, Positive BS, no hepatomegaly Ext: 2+ pedal edema, distal pulses intact and equal Skin: warm/dry no rash   Lab Results:  Recent Labs  08/15/14 0540 08/16/14 0500  WBC 7.1 6.8  HGB 13.4 12.2  PLT 231 254    Recent Labs  08/16/14 0500 08/17/14 0515  NA 141 138  K 5.0 4.2  CL 104 101  CO2 31 30  GLUCOSE 92 96  BUN 40* 29*  CREATININE 1.62* 1.38*    Recent Labs  08/14/14 1405  TROPONINI 0.03    Tele: Paced rhythm, personally reviewed.  Assessment/Plan:  1. Acute on chronic systolic CHF, secondary to underlying hypertrophic cardiomyopathy, NYHA Functional Class 3B 2. Severe mitral regurgitation 3. Severe pulmonary HTN 4. Acute pancreatitis 5. Atrial fibrillation 6. AKI on CKD 3 7. Paroxysmal AFib, currently paced  Remains volume overloaded. Fortunately tolerated low-dose diuretic yesterday. Will repeat dose of IV lasix today. Have to be cautious with diuresis in setting of poor PO intake and renal disease, but she is significantly volume-overloaded on exam.  Sherren Mocha, M.D. 08/17/2014, 9:39 AM

## 2014-08-17 NOTE — Progress Notes (Signed)
PROGRESS NOTE  Carla Black UXL:[id number] DOB: 10-Feb-1928 DOA: 08/13/2014 PCP: Leandrew Koyanagi, MD  HPI: Carla Black is a 81 y.o. female with Past medical history of hypertension, dyslipidemia, HOCM, chronic combined CHF, chronic hypoxic respiratory failure, pacemaker implant, GERD, A. fib, peripheral vascular disease. The patient presented with complaints of abdominal pain and a fall, found to have acute pancreatitis, cholelithiasis, worsening of her CHF.  Subjective / 24 H Interval events - no nausea/abdominal pain this morning  Assessment/Plan: Principal Problem:   Pancreatitis, acute Active Problems:   Abdominal pain   AKI (acute kidney injury)   Elevated LFTs   Acute on chronic combined systolic and diastolic heart failure   Supratherapeutic INR   BRBPR (bright red blood per rectum)   Abnormal gall bladder diagnostic imaging   Acute pancreatitis   Acute kidney injury  Acute pancreatitis / abdominal pain / poor po intake  - possibly related to cholelithiasis - general surgery/GI consulted, appreciate input - gently hydrated, improving, now undergoing diuresis  - improving, advance diet today   HOCM / Mitral regurgitation / acute on chronic diastolic heart failure - repeated 2D echo with severe LVH, EF 60-65%, aortic stenosis/regurgitation, mitral valve regurgitation - hydrated gently for #1, now diuresing  A fib  - now pacemaker dependent - INR high, per pharmacy  Supratherapeutic INR  - no evidence of ongoing bleeding  Bright red blood per rectum - no longer reported  Acute on chronic renal failure - CKD stage III - likely due to dehydration, improved with fluids - continue to closely monitor  Elevated LFTs with elevated bilirubin - no CBD dilatation, GI following - improving  Diet: Diet full liquid Fluids: none  DVT Prophylaxis: Coumadin  Code Status: Full Code Family Communication: d/w patient Disposition Plan:  inpatient  Consultants:  General Surgery   GI  Cardiology   Procedures:  None    Antibiotics  Anti-infectives    None       Studies  No results found.  Objective  Filed Vitals:   08/16/14 2022 08/17/14 0504 08/17/14 0616 08/17/14 0753  BP: 144/81  146/48   Pulse: 58  60   Temp: 98.4 F (36.9 C)  97.4 F (36.3 C)   TempSrc: Axillary  Oral   Resp: 18  18   Height:      Weight:  81.829 kg (180 lb 6.4 oz)    SpO2: 97%  95% 94%    Intake/Output Summary (Last 24 hours) at 08/17/14 1231 Last data filed at 08/17/14 0027  Gross per 24 hour  Intake    240 ml  Output    900 ml  Net   -660 ml   Filed Weights   08/15/14 0500 08/15/14 2108 08/17/14 0504  Weight: 83 kg (182 lb 15.7 oz) 81.647 kg (180 lb) 81.829 kg (180 lb 6.4 oz)    Exam:  General:  NAD  HEENT: no scleral icterus  Cardiovascular: RRR  Respiratory: bibasilar crackles, no wheezing  Abdomen: soft, tender in the epigastric area, BS +  MSK/Extremities: 2+ pitting LE edema  Skin: no rashes  Neuro: non focal   Data Reviewed: Basic Metabolic Panel:  Recent Labs Lab 08/13/14 1733 08/14/14 0507 08/15/14 0540 08/16/14 0500 08/17/14 0515  NA 137 136 140 141 138  K 4.4 4.1 4.9 5.0 4.2  CL 99 99 99 104 101  CO2 29 29 30 31 30   GLUCOSE 86 63* 55* 92 96  BUN 63* 59* 51* 40*  29*  CREATININE 2.98* 2.66* 1.96* 1.62* 1.38*  CALCIUM 8.4 8.1* 8.9 8.5 8.4   Liver Function Tests:  Recent Labs Lab 08/13/14 1733 08/14/14 0507 08/15/14 0540 08/16/14 0500 08/17/14 0515  AST 182* 110* 87* 66* 67*  ALT 199* 147* 137* 106* 99*  ALKPHOS 207* 172* 180* 147* 146*  BILITOT 1.7* 1.5* 2.3* 1.6* 1.6*  PROT 7.1 6.1 6.9 6.3 6.5  ALBUMIN 3.1* 2.5* 2.8* 2.6* 2.7*    Recent Labs Lab 08/13/14 1733 08/14/14 0507 08/15/14 0540 08/17/14 0515  LIPASE 606* 307* 145* 65*   CBC:  Recent Labs Lab 08/13/14 1733 08/14/14 0010 08/15/14 0540 08/16/14 0500  WBC 8.2 7.9 7.1 6.8  NEUTROABS 6.9 5.9   --   --   HGB 12.4 12.1 13.4 12.2  HCT 39.0 38.5 42.6 39.4  MCV 95.8 95.8 97.9 98.0  PLT 221 217 231 254   Cardiac Enzymes:  Recent Labs Lab 08/13/14 1733 08/14/14 0010 08/14/14 0507 08/14/14 1405  TROPONINI 0.05* 0.04* 0.03 0.03   BNP (last 3 results)  Recent Labs  08/13/14 1733  BNP 1326.6*   Scheduled Meds: . amiodarone  200 mg Oral Daily  . antiseptic oral rinse  7 mL Mouth Rinse q12n4p  . aspirin EC  81 mg Oral Daily  . chlorhexidine  15 mL Mouth Rinse BID  . diltiazem  180 mg Oral QPM  . ferrous sulfate  325 mg Oral Q breakfast  . fluticasone  2 spray Each Nare Daily  . folic acid  1 mg Oral Daily  . hydrocortisone  25 mg Rectal BID  . levalbuterol  0.315 mg Nebulization TID  . levothyroxine  100 mcg Oral QAC breakfast  . loratadine  10 mg Oral Daily  . meclizine  25 mg Oral QHS  . metoprolol  150 mg Oral BID  . sodium chloride  3 mL Intravenous Q12H  . Warfarin - Pharmacist Dosing Inpatient   Does not apply q1800   Continuous Infusions:    Marzetta Board, MD [facility] Pager [phone]. If 7 PM - 7 AM, please contact night-coverage at [email or web address], password [facility] 08/17/2014, 12:31 PM  LOS: 4 days

## 2014-08-18 ENCOUNTER — Inpatient Hospital Stay (HOSPITAL_COMMUNITY): Payer: PPO

## 2014-08-18 DIAGNOSIS — M545 Low back pain: Secondary | ICD-10-CM

## 2014-08-18 DIAGNOSIS — M25551 Pain in right hip: Secondary | ICD-10-CM

## 2014-08-18 DIAGNOSIS — M25552 Pain in left hip: Secondary | ICD-10-CM

## 2014-08-18 LAB — BASIC METABOLIC PANEL
ANION GAP: 8 (ref 5–15)
BUN: 25 mg/dL — ABNORMAL HIGH (ref 6–23)
CALCIUM: 8.5 mg/dL (ref 8.4–10.5)
CO2: 32 mmol/L (ref 19–32)
Chloride: 99 mmol/L (ref 96–112)
Creatinine, Ser: 1.2 mg/dL — ABNORMAL HIGH (ref 0.50–1.10)
GFR calc non Af Amer: 40 mL/min — ABNORMAL LOW (ref >90)
GFR, EST AFRICAN AMERICAN: 47 mL/min — AB (ref >90)
Glucose, Bld: 101 mg/dL — ABNORMAL HIGH (ref 70–99)
Potassium: 4.4 mmol/L (ref 3.5–5.1)
SODIUM: 139 mmol/L (ref 135–145)

## 2014-08-18 LAB — PROTIME-INR
INR: 2.79 — ABNORMAL HIGH (ref 0.00–1.49)
Prothrombin Time: 29.7 seconds — ABNORMAL HIGH (ref 11.6–15.2)

## 2014-08-18 MED ORDER — WARFARIN SODIUM 2.5 MG PO TABS
1.2500 mg | ORAL_TABLET | Freq: Once | ORAL | Status: AC
  Administered 2014-08-18: 1.25 mg via ORAL
  Filled 2014-08-18: qty 1

## 2014-08-18 MED ORDER — FUROSEMIDE 10 MG/ML IJ SOLN
20.0000 mg | Freq: Once | INTRAMUSCULAR | Status: AC
  Administered 2014-08-18: 20 mg via INTRAVENOUS
  Filled 2014-08-18: qty 2

## 2014-08-18 NOTE — Progress Notes (Signed)
Progress Note for [facility]  Subjective: See below.  Objective: Vital signs in last 24 hours: Temp:  [97.3 F (36.3 C)-98.5 F (36.9 C)] 98.5 F (36.9 C) (02/27 0449) Pulse Rate:  [58-61] 59 (02/27 0449) Resp:  [18-19] 18 (02/27 0449) BP: (122-150)/(43-59) 143/51 mmHg (02/27 0449) SpO2:  [96 %-100 %] 96 % (02/27 0907) FiO2 (%):  [28 %] 28 % (02/27 0907) Weight:  [83.19 kg (183 lb 6.4 oz)] 83.19 kg (183 lb 6.4 oz) (02/27 0449) Last BM Date: 08/16/14  Intake/Output from previous day: 02/26 0701 - 02/27 0700 In: 120 [P.O.:120] Out: 1000 [Urine:1000] Intake/Output this shift:    General appearance: alert and fatigued GI: soft, non-tender; bowel sounds normal; no masses,  no organomegaly  Lab Results:  Recent Labs  08/16/14 0500  WBC 6.8  HGB 12.2  HCT 39.4  PLT 254   BMET  Recent Labs  08/16/14 0500 08/17/14 0515 08/18/14 0550  NA 141 138 139  K 5.0 4.2 4.4  CL 104 101 99  CO2 31 30 32  GLUCOSE 92 96 101*  BUN 40* 29* 25*  CREATININE 1.62* 1.38* 1.20*  CALCIUM 8.5 8.4 8.5   LFT  Recent Labs  08/17/14 0515  PROT 6.5  ALBUMIN 2.7*  AST 67*  ALT 99*  ALKPHOS 146*  BILITOT 1.6*  BILIDIR 0.8*  IBILI 0.8   PT/INR  Recent Labs  08/17/14 0515 08/18/14 0550  LABPROT 33.0* 29.7*  INR 3.20* 2.79*   Hepatitis Panel No results for input(s): HEPBSAG, HCVAB, HEPAIGM, HEPBIGM in the last 72 hours. C-Diff No results for input(s): CDIFFTOX in the last 72 hours. Fecal Lactopherrin No results for input(s): FECLLACTOFRN in the last 72 hours.  Studies/Results: No results found.  Medications:  Scheduled: . amiodarone  200 mg Oral Daily  . antiseptic oral rinse  7 mL Mouth Rinse q12n4p  . aspirin EC  81 mg Oral Daily  . chlorhexidine  15 mL Mouth Rinse BID  . diltiazem  180 mg Oral QPM  . ferrous sulfate  325 mg Oral Q breakfast  . fluticasone  2 spray Each Nare Daily  . folic acid  1 mg Oral Daily  . hydrocortisone  25 mg Rectal BID  .  levalbuterol  0.315 mg Nebulization TID  . levothyroxine  100 mcg Oral QAC breakfast  . loratadine  10 mg Oral Daily  . meclizine  25 mg Oral QHS  . metoprolol  150 mg Oral BID  . sodium chloride  3 mL Intravenous Q12H  . Warfarin - Pharmacist Dosing Inpatient   Does not apply q1800   Continuous:   Assessment/Plan: 1) Biliary pancreatitis. 2) Severe Mitral Regurg. 3) Severe Pulmonary HTN.   I came back twice at her request as she was using the commode.  Upon my return she was still on the commode.  The Nursing staff reports that she mental status may be a little different.  I do not have any comparison as this is my first time evaluating the patient.  There is no abdominal pain with palpation and she denies any pain at this time.  The patient is very frail appearing and her liver enzymes have mildly improved.  I agree that GI intervention needs to be avoided to prevent any further worsening of her tenuous clinical status.   Plan: 1) Continue with supportive care.    2) Follow mental status closely.  LOS: 5 days   Azel Gumina D 08/18/2014, 9:37 AM

## 2014-08-18 NOTE — Progress Notes (Signed)
ANTICOAGULATION CONSULT NOTE - Follow up  Pharmacy Consult for warfarin Indication: atrial fibrillation  Allergies  Allergen Reactions  . Pseudoephedrine Other (See Comments)    Insomnia (took 1 tablet and could not sleep for 3 days)  . Statins Other (See Comments)    myalgia  . Codeine Nausea Only and Other (See Comments)    dizziness  . Seldane [Terfenadine] Nausea Only and Other (See Comments)    dizziness    Patient Measurements: Height: 5\' 3"  (160 cm) Weight: 183 lb 6.4 oz (83.19 kg) IBW/kg (Calculated) : 52.4   Vital Signs: Temp: 97.8 F (36.6 C) (02/27 1309) Temp Source: Oral (02/27 1309) BP: 132/41 mmHg (02/27 1309) Pulse Rate: 59 (02/27 1309)  Labs:  Recent Labs  08/16/14 0500 08/17/14 0515 08/18/14 0550  HGB 12.2  --   --   HCT 39.4  --   --   PLT 254  --   --   LABPROT 41.2* 33.0* 29.7*  INR 4.25* 3.20* 2.79*  CREATININE 1.62* 1.38* 1.20*    Estimated Creatinine Clearance: 35.6 mL/min (by C-G formula based on Cr of 1.2).   Medications:  Scheduled:  . amiodarone  200 mg Oral Daily  . antiseptic oral rinse  7 mL Mouth Rinse q12n4p  . aspirin EC  81 mg Oral Daily  . chlorhexidine  15 mL Mouth Rinse BID  . diltiazem  180 mg Oral QPM  . ferrous sulfate  325 mg Oral Q breakfast  . fluticasone  2 spray Each Nare Daily  . folic acid  1 mg Oral Daily  . hydrocortisone  25 mg Rectal BID  . levalbuterol  0.315 mg Nebulization TID  . levothyroxine  100 mcg Oral QAC breakfast  . loratadine  10 mg Oral Daily  . meclizine  25 mg Oral QHS  . metoprolol  150 mg Oral BID  . sodium chloride  3 mL Intravenous Q12H  . Warfarin - Pharmacist Dosing Inpatient   Does not apply q1800    Assessment: 81 y.o. female with a significant cardiac history on chronic Coumadin for atrial fibrillation who presented to ED 2/22 with acute abdominal pain, weakness and a fall. She has chronic, intermittent loose stools but 2/21 had several episodes of diarrhea followed by  rectal bleeding. Admission INR SUPRA-therapeutic. Pharmacy consulted to continue warfarin while inpatient.  -Per last anti-coag note on 1/29 warfarin dose is 2.5mg  daily except 1.25mg  on Mon and Fri. -pt has had poor appetite for several months   Significant events: 2/22-25: INR supratherapeutic. No Coumadin. 2/26: Coumadin resumed at low dose  Today, 08/18/2014: INR improving, now within therapeutic range at 2.79 2/25 - CBC wnl. No bleeding reported/documented. SCr greatly improved Full liquid diet- eating 25-50% Amiodarone and aspirin from home continued.  Goal of Therapy:  INR 2-3   Plan:  Coumadin 1.25mg  x1 tonight Daily INR CBC in AM Pharmacy will follow up daily  Thank you for the consult.  Currie Paris, PharmD, BCPS Pager: [phone] Pharmacy: [phone] 08/18/2014 2:41 PM

## 2014-08-18 NOTE — Progress Notes (Signed)
PROGRESS NOTE  Carla Black ZCH:[id number] DOB: March 26, 1928 DOA: 08/13/2014 PCP: Leandrew Koyanagi, MD  HPI: Carla Black is a 81 y.o. female with Past medical history of hypertension, dyslipidemia, HOCM, chronic combined CHF, chronic hypoxic respiratory failure, pacemaker implant, GERD, A. fib, peripheral vascular disease. The patient presented with complaints of abdominal pain and a fall, found to have acute pancreatitis, cholelithiasis, worsening of her CHF.  Subjective / 24 H Interval events - received pain medications this morning, sleepy but able to converse, however somewhat slow  Assessment/Plan: Principal Problem:   Pancreatitis, acute Active Problems:   Abdominal pain   AKI (acute kidney injury)   Elevated LFTs   Acute on chronic combined systolic and diastolic heart failure   Supratherapeutic INR   BRBPR (bright red blood per rectum)   Abnormal gall bladder diagnostic imaging   Acute pancreatitis   Acute kidney injury  Acute pancreatitis / abdominal pain / poor po intake  - possibly related to cholelithiasis - general surgery/GI consulted, appreciate input - gently hydrated, improving, now undergoing diuresis   HOCM / Mitral regurgitation / acute on chronic diastolic heart failure - repeated 2D echo with severe LVH, EF 60-65%, aortic stenosis/regurgitation, mitral valve regurgitation - hydrated gently for #1, now diuresing - appreciate cardiology assistance  Back/hip pain - patient with increased pain in hips/back - obtain XR today   A fib  - now pacemaker dependent - coumadin per pharmacy  Supratherapeutic INR  - resolved  Bright red blood per rectum - no longer reported  Acute on chronic renal failure - CKD stage III - likely due to dehydration, improved with fluids - continue to closely monitor  Elevated LFTs with elevated bilirubin - no CBD dilatation, GI following - improving  Diet: Diet full liquid Fluids: none  DVT Prophylaxis:  Coumadin  Code Status: Full Code Family Communication: d/w patient Disposition Plan: inpatient  Consultants:  General Surgery   GI  Cardiology   Procedures:  None    Antibiotics  Anti-infectives    None       Studies  No results found.  Objective  Filed Vitals:   08/18/14 0449 08/18/14 0907 08/18/14 1309 08/18/14 1415  BP: 143/51  132/41   Pulse: 59  59   Temp: 98.5 F (36.9 C)  97.8 F (36.6 C)   TempSrc: Oral  Oral   Resp: 18  17   Height:      Weight: 83.19 kg (183 lb 6.4 oz)     SpO2: 96% 96% 100% 98%    Intake/Output Summary (Last 24 hours) at 08/18/14 1424 Last data filed at 08/18/14 1053  Gross per 24 hour  Intake      0 ml  Output    600 ml  Net   -600 ml   Filed Weights   08/15/14 2108 08/17/14 0504 08/18/14 0449  Weight: 81.647 kg (180 lb) 81.829 kg (180 lb 6.4 oz) 83.19 kg (183 lb 6.4 oz)    Exam:  General:  NAD  HEENT: no scleral icterus  Cardiovascular: RRR  Respiratory: bibasilar crackles, no wheezing  Abdomen: soft, tender in the epigastric area, BS +  MSK/Extremities: 2+ pitting LE edema  Skin: no rashes  Neuro: non focal   Data Reviewed: Basic Metabolic Panel:  Recent Labs Lab 08/14/14 0507 08/15/14 0540 08/16/14 0500 08/17/14 0515 08/18/14 0550  NA 136 140 141 138 139  K 4.1 4.9 5.0 4.2 4.4  CL 99 99 104 101 99  CO2  29 30 31 30  32  GLUCOSE 63* 55* 92 96 101*  BUN 59* 51* 40* 29* 25*  CREATININE 2.66* 1.96* 1.62* 1.38* 1.20*  CALCIUM 8.1* 8.9 8.5 8.4 8.5   Liver Function Tests:  Recent Labs Lab 08/13/14 1733 08/14/14 0507 08/15/14 0540 08/16/14 0500 08/17/14 0515  AST 182* 110* 87* 66* 67*  ALT 199* 147* 137* 106* 99*  ALKPHOS 207* 172* 180* 147* 146*  BILITOT 1.7* 1.5* 2.3* 1.6* 1.6*  PROT 7.1 6.1 6.9 6.3 6.5  ALBUMIN 3.1* 2.5* 2.8* 2.6* 2.7*    Recent Labs Lab 08/13/14 1733 08/14/14 0507 08/15/14 0540 08/17/14 0515  LIPASE 606* 307* 145* 65*   CBC:  Recent Labs Lab  08/13/14 1733 08/14/14 0010 08/15/14 0540 08/16/14 0500  WBC 8.2 7.9 7.1 6.8  NEUTROABS 6.9 5.9  --   --   HGB 12.4 12.1 13.4 12.2  HCT 39.0 38.5 42.6 39.4  MCV 95.8 95.8 97.9 98.0  PLT 221 217 231 254   Cardiac Enzymes:  Recent Labs Lab 08/13/14 1733 08/14/14 0010 08/14/14 0507 08/14/14 1405  TROPONINI 0.05* 0.04* 0.03 0.03   BNP (last 3 results)  Recent Labs  08/13/14 1733  BNP 1326.6*   Scheduled Meds: . amiodarone  200 mg Oral Daily  . antiseptic oral rinse  7 mL Mouth Rinse q12n4p  . aspirin EC  81 mg Oral Daily  . chlorhexidine  15 mL Mouth Rinse BID  . diltiazem  180 mg Oral QPM  . ferrous sulfate  325 mg Oral Q breakfast  . fluticasone  2 spray Each Nare Daily  . folic acid  1 mg Oral Daily  . hydrocortisone  25 mg Rectal BID  . levalbuterol  0.315 mg Nebulization TID  . levothyroxine  100 mcg Oral QAC breakfast  . loratadine  10 mg Oral Daily  . meclizine  25 mg Oral QHS  . metoprolol  150 mg Oral BID  . sodium chloride  3 mL Intravenous Q12H  . Warfarin - Pharmacist Dosing Inpatient   Does not apply q1800   Continuous Infusions:    Marzetta Board, MD [facility] Pager [phone]. If 7 PM - 7 AM, please contact night-coverage at [email or web address], password [facility] 08/18/2014, 2:24 PM  LOS: 5 days

## 2014-08-19 ENCOUNTER — Inpatient Hospital Stay (HOSPITAL_COMMUNITY): Payer: PPO

## 2014-08-19 DIAGNOSIS — M546 Pain in thoracic spine: Secondary | ICD-10-CM

## 2014-08-19 DIAGNOSIS — D72829 Elevated white blood cell count, unspecified: Secondary | ICD-10-CM | POA: Insufficient documentation

## 2014-08-19 LAB — URINALYSIS, ROUTINE W REFLEX MICROSCOPIC
Glucose, UA: NEGATIVE mg/dL
Hgb urine dipstick: NEGATIVE
Ketones, ur: NEGATIVE mg/dL
Nitrite: NEGATIVE
PH: 5 (ref 5.0–8.0)
PROTEIN: NEGATIVE mg/dL
SPECIFIC GRAVITY, URINE: 1.018 (ref 1.005–1.030)
UROBILINOGEN UA: 2 mg/dL — AB (ref 0.0–1.0)

## 2014-08-19 LAB — CBC
HCT: 38.9 % (ref 36.0–46.0)
Hemoglobin: 11.9 g/dL — ABNORMAL LOW (ref 12.0–15.0)
MCH: 30.1 pg (ref 26.0–34.0)
MCHC: 30.6 g/dL (ref 30.0–36.0)
MCV: 98.2 fL (ref 78.0–100.0)
PLATELETS: 256 10*3/uL (ref 150–400)
RBC: 3.96 MIL/uL (ref 3.87–5.11)
RDW: 15.8 % — AB (ref 11.5–15.5)
WBC: 14.7 10*3/uL — AB (ref 4.0–10.5)

## 2014-08-19 LAB — BASIC METABOLIC PANEL
ANION GAP: 5 (ref 5–15)
BUN: 31 mg/dL — ABNORMAL HIGH (ref 6–23)
CALCIUM: 8.1 mg/dL — AB (ref 8.4–10.5)
CHLORIDE: 99 mmol/L (ref 96–112)
CO2: 32 mmol/L (ref 19–32)
CREATININE: 1.53 mg/dL — AB (ref 0.50–1.10)
GFR calc Af Amer: 35 mL/min — ABNORMAL LOW (ref >90)
GFR calc non Af Amer: 30 mL/min — ABNORMAL LOW (ref >90)
Glucose, Bld: 106 mg/dL — ABNORMAL HIGH (ref 70–99)
Potassium: 4.1 mmol/L (ref 3.5–5.1)
SODIUM: 136 mmol/L (ref 135–145)

## 2014-08-19 LAB — URINE MICROSCOPIC-ADD ON

## 2014-08-19 LAB — PROTIME-INR
INR: 2.66 — ABNORMAL HIGH (ref 0.00–1.49)
PROTHROMBIN TIME: 28.5 s — AB (ref 11.6–15.2)

## 2014-08-19 MED ORDER — WARFARIN SODIUM 2 MG PO TABS
2.0000 mg | ORAL_TABLET | Freq: Once | ORAL | Status: AC
  Administered 2014-08-19: 2 mg via ORAL
  Filled 2014-08-19: qty 1

## 2014-08-19 MED ORDER — FUROSEMIDE 10 MG/ML IJ SOLN
40.0000 mg | Freq: Two times a day (BID) | INTRAMUSCULAR | Status: DC
  Administered 2014-08-19 (×2): 40 mg via INTRAVENOUS
  Filled 2014-08-19 (×2): qty 4

## 2014-08-19 NOTE — Clinical Social Work Note (Signed)
CSW attempted to meet with pt for psychocial assessment.  Pt was asleep but did wake up when CSW entered the room.   Pt could not speak and was not making sense when she spoke other than to say she was in pain.  CSW spoke with pt's RN who stated that pt had just been given her morphine/roxy which would not help with pt communication.  CSW will attempt to do assessment tomorrow  .Dede Query, LCSW [facility] Clinical Social Worker - Weekend Coverage cell #: [phone].

## 2014-08-19 NOTE — Progress Notes (Signed)
ANTICOAGULATION CONSULT NOTE - Follow up  Pharmacy Consult for warfarin Indication: atrial fibrillation  Allergies  Allergen Reactions  . Pseudoephedrine Other (See Comments)    Insomnia (took 1 tablet and could not sleep for 3 days)  . Statins Other (See Comments)    myalgia  . Codeine Nausea Only and Other (See Comments)    dizziness  . Seldane [Terfenadine] Nausea Only and Other (See Comments)    dizziness    Patient Measurements: Height: 5\' 3"  (160 cm) Weight: 185 lb 6.5 oz (84.1 kg) (reported that pt allmost fell during the day) IBW/kg (Calculated) : 52.4   Vital Signs: Temp: 97.9 F (36.6 C) (02/28 0628) Temp Source: Oral (02/28 0628) BP: 126/80 mmHg (02/28 1036) Pulse Rate: 62 (02/28 1043)  Labs:  Recent Labs  08/17/14 0515 08/18/14 0550 08/19/14 0540 08/19/14 0542  HGB  --   --  11.9*  --   HCT  --   --  38.9  --   PLT  --   --  256  --   LABPROT 33.0* 29.7* 28.5*  --   INR 3.20* 2.79* 2.66*  --   CREATININE 1.38* 1.20*  --  1.53*    Estimated Creatinine Clearance: 28.1 mL/min (by C-G formula based on Cr of 1.53).   Medications:  Scheduled:  . amiodarone  200 mg Oral Daily  . antiseptic oral rinse  7 mL Mouth Rinse q12n4p  . aspirin EC  81 mg Oral Daily  . chlorhexidine  15 mL Mouth Rinse BID  . diltiazem  180 mg Oral QPM  . ferrous sulfate  325 mg Oral Q breakfast  . fluticasone  2 spray Each Nare Daily  . folic acid  1 mg Oral Daily  . furosemide  40 mg Intravenous BID  . hydrocortisone  25 mg Rectal BID  . levalbuterol  0.315 mg Nebulization TID  . levothyroxine  100 mcg Oral QAC breakfast  . loratadine  10 mg Oral Daily  . meclizine  25 mg Oral QHS  . metoprolol  150 mg Oral BID  . sodium chloride  3 mL Intravenous Q12H  . Warfarin - Pharmacist Dosing Inpatient   Does not apply q1800    Assessment: 81 y.o. female with a significant cardiac history on chronic Coumadin for atrial fibrillation who presented to ED 2/22 with acute  abdominal pain, weakness and a fall. She has chronic, intermittent loose stools but 2/21 had several episodes of diarrhea followed by rectal bleeding. Admission INR SUPRA-therapeutic. Pharmacy consulted to continue warfarin while inpatient.  -Per last anti-coag note on 1/29 warfarin dose is 2.5mg  daily except 1.25mg  on Mon and Fri. -pt has had poor appetite for several months   Significant events: 2/22-25: INR supratherapeutic. No Coumadin. 2/26: Coumadin resumed at low dose  Today, 08/19/2014: INR remains within therapeutic range at 2.66 CBC stable. No bleeding reported/documented. SCr worsened Now on heart healthy diet- eating 75% Amiodarone and aspirin from home continued.  Goal of Therapy:  INR 2-3   Plan:  Coumadin 2mg  x1 tonight Daily INR Pharmacy will follow up daily  Thank you for the consult.  Currie Paris, PharmD, BCPS Pager: [phone] Pharmacy: [phone] 08/19/2014 2:13 PM

## 2014-08-19 NOTE — Progress Notes (Signed)
Pt is alert, but more lethargic than on nights when this RN has previously taken care of her. Pt appears weaker than she was during prior shifts. Will continue to monitor.

## 2014-08-19 NOTE — Progress Notes (Signed)
Subjective: Feeling well.  No complaints, but she does not want to use a bed pan.  Objective: Vital signs in last 24 hours: Temp:  [97.5 F (36.4 C)-97.9 F (36.6 C)] 97.9 F (36.6 C) (02/28 0628) Pulse Rate:  [59-60] 60 (02/28 0628) Resp:  [17-24] 20 (02/28 0628) BP: (110-146)/(40-75) 110/42 mmHg (02/28 0628) SpO2:  [94 %-100 %] 96 % (02/28 0816) FiO2 (%):  [28 %] 28 % (02/27 0907) Weight:  [84.1 kg (185 lb 6.5 oz)] 84.1 kg (185 lb 6.5 oz) (02/28 0628) Last BM Date: 08/14/14  Intake/Output from previous day: 02/27 0701 - 02/28 0700 In: 360 [P.O.:360] Out: 150 [Urine:150] Intake/Output this shift:    General appearance: alert and no distress GI: epigastric tenderness  Lab Results:  Recent Labs  08/19/14 0540  WBC 14.7*  HGB 11.9*  HCT 38.9  PLT 256   BMET  Recent Labs  08/17/14 0515 08/18/14 0550  NA 138 139  K 4.2 4.4  CL 101 99  CO2 30 32  GLUCOSE 96 101*  BUN 29* 25*  CREATININE 1.38* 1.20*  CALCIUM 8.4 8.5   LFT  Recent Labs  08/17/14 0515  PROT 6.5  ALBUMIN 2.7*  AST 67*  ALT 99*  ALKPHOS 146*  BILITOT 1.6*  BILIDIR 0.8*  IBILI 0.8   PT/INR  Recent Labs  08/18/14 0550 08/19/14 0540  LABPROT 29.7* 28.5*  INR 2.79* 2.66*   Hepatitis Panel No results for input(s): HEPBSAG, HCVAB, HEPAIGM, HEPBIGM in the last 72 hours. C-Diff No results for input(s): CDIFFTOX in the last 72 hours. Fecal Lactopherrin No results for input(s): FECLLACTOFRN in the last 72 hours.  Studies/Results: Dg Lumbar Spine Complete  08/18/2014   CLINICAL DATA:  Right greater than left hip pain. Back pain for 5 days. Patient fell tissue is scanning from the toilet. Slipped and fell on the floor at home.  EXAM: LUMBAR SPINE - COMPLETE 4+ VIEW  COMPARISON:  CT of the abdomen and pelvis 08/13/2014  FINDINGS: There is normal alignment of lumbar spine. Degenerative changes are mild. There is dense calcification of the abdominal aorta.  IMPRESSION: No evidence for acute   abnormality.   Electronically Signed   By: Nolon Nations M.D.   On: 08/18/2014 16:36   Dg Hips Bilat With Pelvis 3-4 Views  08/18/2014   CLINICAL DATA:  Right greater than left hip pain. Back pain for 5 days. Status post fall while getting out from the toilet. Patient slipped and fell on the floor at her home.  EXAM: BILATERAL HIP (WITH PELVIS) 3-4 VIEWS  COMPARISON:  None.  FINDINGS: There is no evidence of fracture or other focal bone lesions. Soft tissues are unremarkable.  IMPRESSION: Negative.   Electronically Signed   By: Nolon Nations M.D.   On: 08/18/2014 16:53    Medications:  Scheduled: . amiodarone  200 mg Oral Daily  . antiseptic oral rinse  7 mL Mouth Rinse q12n4p  . aspirin EC  81 mg Oral Daily  . chlorhexidine  15 mL Mouth Rinse BID  . diltiazem  180 mg Oral QPM  . ferrous sulfate  325 mg Oral Q breakfast  . fluticasone  2 spray Each Nare Daily  . folic acid  1 mg Oral Daily  . hydrocortisone  25 mg Rectal BID  . levalbuterol  0.315 mg Nebulization TID  . levothyroxine  100 mcg Oral QAC breakfast  . loratadine  10 mg Oral Daily  . meclizine  25 mg Oral QHS  .  metoprolol  150 mg Oral BID  . sodium chloride  3 mL Intravenous Q12H  . Warfarin - Pharmacist Dosing Inpatient   Does not apply q1800   Continuous:   Assessment/Plan: 1) Biliary pancreatitis. 2) Severe Mitral Regurg. 3) Severe Pulmonary HTN.   Her mental status appears to be normal at this time.  No significant abdominal pain, but she does report that it is intermittent.  Her WBC has increased, but no reports of any fever.  She denies any productive cough.  No liver enzymes this morning.  Plan: 1) Supportive care.  LOS: 6 days   Jacelynn Hayton D 08/19/2014, 8:18 AM

## 2014-08-19 NOTE — Progress Notes (Addendum)
PROGRESS NOTE  Carla Black XNT:[id number] DOB: [birth year] DOA: 08/13/2014 PCP: Leandrew Koyanagi, MD  HPI: Carla Black is a 81 y.o. female with Past medical history of hypertension, dyslipidemia, HOCM, chronic combined CHF, chronic hypoxic respiratory failure, pacemaker implant, GERD, A. fib, peripheral vascular disease. The patient presented with complaints of abdominal pain and a fall, found to have acute pancreatitis, cholelithiasis, worsening of her CHF.  Subjective / 24 H Interval events - mental status much improved today  Assessment/Plan: Principal Problem:   Pancreatitis, acute Active Problems:   Abdominal pain   AKI (acute kidney injury)   Elevated LFTs   Acute on chronic combined systolic and diastolic heart failure   Supratherapeutic INR   BRBPR (bright red blood per rectum)   Abnormal gall bladder diagnostic imaging   Acute pancreatitis   Acute kidney injury  Leukocytosis  - increase in white count on 2/28 - Will obtain a chest x-ray, urinalysis  - Patient without significant symptoms such as dysuria or cough/chest congestion  - Does report troubles with [employer] last night as she felt it went on the wrong way and caused her coughing  - Denies any diarrhea   Acute pancreatitis / abdominal pain / poor po intake  - possibly related to cholelithiasis - general surgery/GI consulted, appreciate input - gently hydrated, improving, now undergoing diuresis  - Advance diet to low-fat today  HOCM / Mitral regurgitation / acute on chronic diastolic heart failure - repeated 2D echo with severe LVH, EF 60-65%, aortic stenosis/regurgitation, mitral valve regurgitation - hydrated gently for #1, now diuresing - appreciate cardiology assistance, renal function slightly up today, closely monitor with diuresis  Back/hip pain - patient with increased pain in hips/back - X-ray without acute findings  A fib  - now pacemaker dependent - coumadin per  pharmacy  Supratherapeutic INR  - resolved  Bright red blood per rectum - no longer reported  Acute on chronic renal failure - CKD stage III - likely due to dehydration, improved with fluids - continue to closely monitor  Elevated LFTs with elevated bilirubin - no CBD dilatation, GI following - improving  Diet: Diet Heart Fluids: none  DVT Prophylaxis: Coumadin  Code Status: Full Code Family Communication: d/w patient Disposition Plan: inpatient  Consultants:  General Surgery   GI  Cardiology   Procedures:  None    Antibiotics  Anti-infectives    None       Studies  Dg Lumbar Spine Complete  08/18/2014   CLINICAL DATA:  Right greater than left hip pain. Back pain for 5 days. Patient fell tissue is scanning from the toilet. Slipped and fell on the floor at home.  EXAM: LUMBAR SPINE - COMPLETE 4+ VIEW  COMPARISON:  CT of the abdomen and pelvis 08/13/2014  FINDINGS: There is normal alignment of lumbar spine. Degenerative changes are mild. There is dense calcification of the abdominal aorta.  IMPRESSION: No evidence for acute  abnormality.   Electronically Signed   By: Nolon Nations M.D.   On: 08/18/2014 16:36   Dg Hips Bilat With Pelvis 3-4 Views  08/18/2014   CLINICAL DATA:  Right greater than left hip pain. Back pain for 5 days. Status post fall while getting out from the toilet. Patient slipped and fell on the floor at her home.  EXAM: BILATERAL HIP (WITH PELVIS) 3-4 VIEWS  COMPARISON:  None.  FINDINGS: There is no evidence of fracture or other focal bone lesions. Soft tissues are unremarkable.  IMPRESSION:  Negative.   Electronically Signed   By: Nolon Nations M.D.   On: 08/18/2014 16:53    Objective  Filed Vitals:   08/19/14 0628 08/19/14 0816 08/19/14 1036 08/19/14 1043  BP: 110/42  126/80   Pulse: 60  59 62  Temp: 97.9 F (36.6 C)     TempSrc: Oral     Resp: 20  20   Height:      Weight: 84.1 kg (185 lb 6.5 oz)     SpO2: 94% 96% 94%      Intake/Output Summary (Last 24 hours) at 08/19/14 1047 Last data filed at 08/18/14 2045  Gross per 24 hour  Intake    360 ml  Output    150 ml  Net    210 ml   Filed Weights   08/17/14 0504 08/18/14 0449 08/19/14 0628  Weight: 81.829 kg (180 lb 6.4 oz) 83.19 kg (183 lb 6.4 oz) 84.1 kg (185 lb 6.5 oz)    Exam:  General:  NAD  HEENT: no scleral icterus  Cardiovascular: RRR  Respiratory: bibasilar crackles, no wheezing  Abdomen: soft, tender in the epigastric area, BS +  MSK/Extremities: 2+ pitting LE edema  Skin: no rashes  Neuro: non focal   Data Reviewed: Basic Metabolic Panel:  Recent Labs Lab 08/15/14 0540 08/16/14 0500 08/17/14 0515 08/18/14 0550 08/19/14 0542  NA 140 141 138 139 136  K 4.9 5.0 4.2 4.4 4.1  CL 99 104 101 99 99  CO2 30 31 30  32 32  GLUCOSE 55* 92 96 101* 106*  BUN 51* 40* 29* 25* 31*  CREATININE 1.96* 1.62* 1.38* 1.20* 1.53*  CALCIUM 8.9 8.5 8.4 8.5 8.1*   Liver Function Tests:  Recent Labs Lab 08/13/14 1733 08/14/14 0507 08/15/14 0540 08/16/14 0500 08/17/14 0515  AST 182* 110* 87* 66* 67*  ALT 199* 147* 137* 106* 99*  ALKPHOS 207* 172* 180* 147* 146*  BILITOT 1.7* 1.5* 2.3* 1.6* 1.6*  PROT 7.1 6.1 6.9 6.3 6.5  ALBUMIN 3.1* 2.5* 2.8* 2.6* 2.7*    Recent Labs Lab 08/13/14 1733 08/14/14 0507 08/15/14 0540 08/17/14 0515  LIPASE 606* 307* 145* 65*   CBC:  Recent Labs Lab 08/13/14 1733 08/14/14 0010 08/15/14 0540 08/16/14 0500 08/19/14 0540  WBC 8.2 7.9 7.1 6.8 14.7*  NEUTROABS 6.9 5.9  --   --   --   HGB 12.4 12.1 13.4 12.2 11.9*  HCT 39.0 38.5 42.6 39.4 38.9  MCV 95.8 95.8 97.9 98.0 98.2  PLT 221 217 231 254 256   Cardiac Enzymes:  Recent Labs Lab 08/13/14 1733 08/14/14 0010 08/14/14 0507 08/14/14 1405  TROPONINI 0.05* 0.04* 0.03 0.03   BNP (last 3 results)  Recent Labs  08/13/14 1733  BNP 1326.6*   Scheduled Meds: . amiodarone  200 mg Oral Daily  . antiseptic oral rinse  7 mL Mouth  Rinse q12n4p  . aspirin EC  81 mg Oral Daily  . chlorhexidine  15 mL Mouth Rinse BID  . diltiazem  180 mg Oral QPM  . ferrous sulfate  325 mg Oral Q breakfast  . fluticasone  2 spray Each Nare Daily  . folic acid  1 mg Oral Daily  . furosemide  40 mg Intravenous BID  . hydrocortisone  25 mg Rectal BID  . levalbuterol  0.315 mg Nebulization TID  . levothyroxine  100 mcg Oral QAC breakfast  . loratadine  10 mg Oral Daily  . meclizine  25 mg Oral QHS  .  metoprolol  150 mg Oral BID  . sodium chloride  3 mL Intravenous Q12H  . Warfarin - Pharmacist Dosing Inpatient   Does not apply q1800   Continuous Infusions:    Marzetta Board, MD [facility] Pager [phone]. If 7 PM - 7 AM, please contact night-coverage at [email or web address], password [facility] 08/19/2014, 10:47 AM  LOS: 6 days

## 2014-08-19 NOTE — Progress Notes (Signed)
Subjective: No CP  No SOB at rest though sats 80 on RA Objective: Filed Vitals:   08/18/14 2012 08/18/14 2257 08/19/14 0305 08/19/14 0628  BP:  116/40 146/75 110/42  Pulse: 60 60 60 60  Temp:  97.5 F (36.4 C) 97.7 F (36.5 C) 97.9 F (36.6 C)  TempSrc:  Oral Oral Oral  Resp: 18 24 22 20   Height:      Weight:    185 lb 6.5 oz (84.1 kg)  SpO2: 98% 97%  94%   Weight change: 2 lb 0.1 oz (0.91 kg)  Intake/Output Summary (Last 24 hours) at 08/19/14 0727 Last data filed at 08/18/14 2045  Gross per 24 hour  Intake    360 ml  Output    150 ml  Net    210 ml    General: Alert, awake, oriented x3, in no acute distress Neck:  JVP is normal Heart: Regular rate and rhythm, without murmurs, rubs, gallops.  Lungs  Crackles at bases   Exemities:  1-2 + edema.   Neuro: Grossly intact, nonfocal.  TeleA:  AV paced    Lab Results: Results for orders placed or performed during the hospital encounter of 08/13/14 (from the past 24 hour(s))  Protime-INR     Status: Abnormal   Collection Time: 08/19/14  5:40 AM  Result Value Ref Range   Prothrombin Time 28.5 (H) 11.6 - 15.2 seconds   INR 2.66 (H) 0.00 - 1.49  CBC     Status: Abnormal   Collection Time: 08/19/14  5:40 AM  Result Value Ref Range   WBC 14.7 (H) 4.0 - 10.5 K/uL   RBC 3.96 3.87 - 5.11 MIL/uL   Hemoglobin 11.9 (L) 12.0 - 15.0 g/dL   HCT 38.9 36.0 - 46.0 %   MCV 98.2 78.0 - 100.0 fL   MCH 30.1 26.0 - 34.0 pg   MCHC 30.6 30.0 - 36.0 g/dL   RDW 15.8 (H) 11.5 - 15.5 %   Platelets 256 150 - 400 K/uL    Studies/Results: Dg Lumbar Spine Complete  08/18/2014   CLINICAL DATA:  Right greater than left hip pain. Back pain for 5 days. Patient fell tissue is scanning from the toilet. Slipped and fell on the floor at home.  EXAM: LUMBAR SPINE - COMPLETE 4+ VIEW  COMPARISON:  CT of the abdomen and pelvis 08/13/2014  FINDINGS: There is normal alignment of lumbar spine. Degenerative changes are mild. There is dense calcification of  the abdominal aorta.  IMPRESSION: No evidence for acute  abnormality.   Electronically Signed   By: Nolon Nations M.D.   On: 08/18/2014 16:36   Dg Hips Bilat With Pelvis 3-4 Views  08/18/2014   CLINICAL DATA:  Right greater than left hip pain. Back pain for 5 days. Status post fall while getting out from the toilet. Patient slipped and fell on the floor at her home.  EXAM: BILATERAL HIP (WITH PELVIS) 3-4 VIEWS  COMPARISON:  None.  FINDINGS: There is no evidence of fracture or other focal bone lesions. Soft tissues are unremarkable.  IMPRESSION: Negative.   Electronically Signed   By: Nolon Nations M.D.   On: 08/18/2014 16:53    Medications:  Reviewed   @PROBHOSP @  1  Acute on chornic diastolic CHF  Still with diffuse edema  I would give IV lasix today  Follow BP and urine output    2.  Severe MR  3.  Pulm HTN  4.  Atrial fib  Curr AV paced  Continue meds including anticoagulation      LOS: 6 days   Dorris Carnes 08/19/2014, 7:27 AM

## 2014-08-20 ENCOUNTER — Inpatient Hospital Stay (HOSPITAL_COMMUNITY): Payer: PPO

## 2014-08-20 DIAGNOSIS — I340 Nonrheumatic mitral (valve) insufficiency: Secondary | ICD-10-CM

## 2014-08-20 DIAGNOSIS — I482 Chronic atrial fibrillation: Secondary | ICD-10-CM

## 2014-08-20 DIAGNOSIS — R1010 Upper abdominal pain, unspecified: Secondary | ICD-10-CM

## 2014-08-20 DIAGNOSIS — K851 Biliary acute pancreatitis: Secondary | ICD-10-CM

## 2014-08-20 LAB — CBC
HEMATOCRIT: 34.3 % — AB (ref 36.0–46.0)
HEMOGLOBIN: 10.9 g/dL — AB (ref 12.0–15.0)
MCH: 30.8 pg (ref 26.0–34.0)
MCHC: 31.8 g/dL (ref 30.0–36.0)
MCV: 96.9 fL (ref 78.0–100.0)
Platelets: 242 10*3/uL (ref 150–400)
RBC: 3.54 MIL/uL — ABNORMAL LOW (ref 3.87–5.11)
RDW: 15.6 % — ABNORMAL HIGH (ref 11.5–15.5)
WBC: 15.7 10*3/uL — ABNORMAL HIGH (ref 4.0–10.5)

## 2014-08-20 LAB — COMPREHENSIVE METABOLIC PANEL
ALT: 113 U/L — AB (ref 0–35)
AST: 147 U/L — ABNORMAL HIGH (ref 0–37)
Albumin: 2.2 g/dL — ABNORMAL LOW (ref 3.5–5.2)
Alkaline Phosphatase: 124 U/L — ABNORMAL HIGH (ref 39–117)
Anion gap: 8 (ref 5–15)
BUN: 37 mg/dL — ABNORMAL HIGH (ref 6–23)
CALCIUM: 7.8 mg/dL — AB (ref 8.4–10.5)
CO2: 30 mmol/L (ref 19–32)
Chloride: 98 mmol/L (ref 96–112)
Creatinine, Ser: 1.98 mg/dL — ABNORMAL HIGH (ref 0.50–1.10)
GFR calc non Af Amer: 22 mL/min — ABNORMAL LOW (ref >90)
GFR, EST AFRICAN AMERICAN: 26 mL/min — AB (ref >90)
GLUCOSE: 106 mg/dL — AB (ref 70–99)
Potassium: 4.1 mmol/L (ref 3.5–5.1)
SODIUM: 136 mmol/L (ref 135–145)
TOTAL PROTEIN: 5.7 g/dL — AB (ref 6.0–8.3)
Total Bilirubin: 2 mg/dL — ABNORMAL HIGH (ref 0.3–1.2)

## 2014-08-20 LAB — PROTIME-INR
INR: 2.45 — AB (ref 0.00–1.49)
Prothrombin Time: 26.8 seconds — ABNORMAL HIGH (ref 11.6–15.2)

## 2014-08-20 MED ORDER — PIPERACILLIN-TAZOBACTAM 3.375 G IVPB
3.3750 g | Freq: Three times a day (TID) | INTRAVENOUS | Status: DC
  Administered 2014-08-20 – 2014-08-21 (×3): 3.375 g via INTRAVENOUS
  Filled 2014-08-20 (×5): qty 50

## 2014-08-20 MED ORDER — SENNOSIDES-DOCUSATE SODIUM 8.6-50 MG PO TABS
2.0000 | ORAL_TABLET | Freq: Once | ORAL | Status: AC
  Administered 2014-08-20: 2 via ORAL
  Filled 2014-08-20: qty 2

## 2014-08-20 MED ORDER — GLYCERIN (LAXATIVE) 2.1 G RE SUPP
1.0000 | Freq: Every day | RECTAL | Status: DC | PRN
  Filled 2014-08-20: qty 1

## 2014-08-20 MED ORDER — POLYETHYLENE GLYCOL 3350 17 G PO PACK
17.0000 g | PACK | Freq: Every day | ORAL | Status: DC
  Administered 2014-08-20 – 2014-08-21 (×2): 17 g via ORAL
  Filled 2014-08-20 (×3): qty 1

## 2014-08-20 NOTE — Progress Notes (Signed)
Clinical Social Work Department BRIEF PSYCHOSOCIAL ASSESSMENT 08/20/2014  Patient:  Carla Black, Carla Black     Account Number:  [id number]     Deal date:  08/13/2014  Clinical Social Worker:  Renold Genta  Date/Time:  08/20/2014 02:24 PM  Referred by:  Physician  Date Referred:  08/20/2014 Referred for  Other - See comment   Other Referral:   Admitted from: [facility] type:  Patient Other interview type:   and husband, Dee via phone    PSYCHOSOCIAL DATA Living Status:  FACILITY Admitted from facility:  [facility] Level of care:  Independent Living Primary support name:  Rometta Emery "Karena Addison" Seher (husband) h#: [id number] c#: [phone] Primary support relationship to patient:  SPOUSE Degree of support available:   good    CURRENT CONCERNS Current Concerns  Post-Acute Placement   Other Concerns:    SOCIAL WORK ASSESSMENT / PLAN CSW received consult that patient was admitted from [facility].   Assessment/plan status:  Information/Referral to [employer] Other assessment/ plan:   Information/referral to community resources:   CSW completed FL2 and sent information to [facility] [employer], spoke with Scientist, research (physical sciences) at [facility] who states that as of today, they have no availability in their SNF and that patient may need to go to [facility] at discharge.    PATIENT'S/FAMILY'S RESPONSE TO PLAN OF CARE: Patient & husband were agreeable with plan for SNF at discharge before returning to [facility]. Patient's husband expressed anxiety towards talking about discharge this early, but CSW reassured him that we just need to get a discharge plan in place so that when she is ready for discharge there would be no rushing around. Husband agreeable.          Raynaldo Opitz, [facility] Clinical Social Worker cell #: [phone]

## 2014-08-20 NOTE — Progress Notes (Signed)
ANTIBIOTIC CONSULT NOTE - INITIAL  Pharmacy Consult for Zosyn Indication: Intra-abdominal infection  Allergies  Allergen Reactions  . Pseudoephedrine Other (See Comments)    Insomnia (took 1 tablet and could not sleep for 3 days)  . Statins Other (See Comments)    myalgia  . Codeine Nausea Only and Other (See Comments)    dizziness  . Seldane [Terfenadine] Nausea Only and Other (See Comments)    dizziness    Patient Measurements: Height: 5\' 3"  (160 cm) Weight: 184 lb 1.4 oz (83.5 kg) IBW/kg (Calculated) : 52.4   Vital Signs: Temp: 98 F (36.7 C) (02/29 0620) Temp Source: Oral (02/29 0620) BP: 152/59 mmHg (02/29 0620) Pulse Rate: 60 (02/29 0620) Intake/Output from previous day: 02/28 0701 - 02/29 0700 In: 240 [P.O.:240] Out: 600 [Urine:600] Intake/Output from this shift:    Labs:  Recent Labs  08/18/14 0550 08/19/14 0540 08/19/14 0542 08/20/14 0500  WBC  --  14.7*  --  15.7*  HGB  --  11.9*  --  10.9*  PLT  --  256  --  242  CREATININE 1.20*  --  1.53* 1.98*   Estimated Creatinine Clearance: 21.6 mL/min (by C-G formula based on Cr of 1.98).    Microbiology: No results found for this or any previous visit (from the past 720 hour(s)).  Medical History: Past Medical History  Diagnosis Date  . Hypertension   . Hyperlipidemia   . Heart murmur   . GERD (gastroesophageal reflux disease)   . Joint pain     left knee  . Dizziness   . Chest tightness 02/09/2008  . Hiatal hernia   . CHF (congestive heart failure)   . Angina   . Coronary artery disease   . Hypertrophic cardiomyopathy     subvalvular gradient 45-mm  . Dysrhythmia     hx of atrial fibrilation  . On home oxygen therapy     "2L at rest; 3L w/activity" (06/24/2012)  . Pacemaker 06/24/2012  . Melanoma of back 1961  . Complication of anesthesia   . Claustrophobia     "severe" (06/24/2012)  . Carotid artery occlusion   . Pneumonia     "lastest was 04/26/2011; multiple times before that"  (06/24/2012)  . Chronic bronchitis     "used to get it often" (06/24/2012)  . Shortness of breath     "all the time" (06/24/2012)  . Hypothyroidism   . Stroke 2012    hx of TIA  . Arthritis     "2 fingers" (06/24/2012)  . PAF (paroxysmal atrial fibrillation) 06/24/2012  . Symptomatic bradycardia 06/24/2012  . S/P cardiac pacemaker procedure, insertion [employer] device 06/24/12 06/24/2012  . Carotid artery disease 09/21/2011    40-59% R ICA stenosis; patent L carotid endarterectomy w/minimal restenosis; bilateral vertebral arteries are antegrade  . Cataract     Microbiology: No culture data to date this admission  Antimicrobials: 2/29>>Zosyn>>  Assessment: 81 y/o F with acute pancreatitis, presumed gallstone-related, now with worsening leukocytosis and abdominal discomfort.  Orders received to begin empiric Zosyn with pharmacy dosing assistance.  Goal of Therapy:  Appropriate antibiotic dosing for indication and renal function; eradication of infection.  Today, 08/20/2014: Zosyn to begin today Remains afebrile Leukocytosis worsening Azotemia worsening; estimated CrCl remains above 20 mL/min for now  Plan: 1. Begin  Zosyn 3.375 grams IV q8h (extended-infusion) 2. Follow renal function, clinical course.  Clayburn Pert, PharmD, BCPS Pager: [phone] 08/20/2014  9:30 AM

## 2014-08-20 NOTE — Progress Notes (Signed)
SUBJECTIVE:  Has SHOB with lying flat. Receive diuretics yesterday.  Concerned that MRI was mentioned because she has pacer.  She still has some low back pain, but was comfortable last night.  OBJECTIVE:   Vitals:   Filed Vitals:   08/19/14 1955 08/19/14 2137 08/20/14 0620 08/20/14 0644  BP:  140/74 152/59   Pulse:  61 60   Temp:  97.6 F (36.4 C) 98 F (36.7 C)   TempSrc:  Oral Oral   Resp:  18 20   Height:      Weight:    184 lb 1.4 oz (83.5 kg)  SpO2: 98% 99% 100%    I&O's:   Intake/Output Summary (Last 24 hours) at 08/20/14 0816 Last data filed at 08/20/14 0644  Gross per 24 hour  Intake    240 ml  Output    600 ml  Net   -360 ml   TELEMETRY: Reviewed telemetry pt in AV paced:     PHYSICAL EXAM General: Well developed, well nourished, in no acute distress Head:   Normal cephalic and atramatic  Lungs:  No wheezing. Heart:   HRRR S1 S2  No JVD.   Abdomen: abdomen soft and non-tender Msk:  Back normal,  Normal strength and tone for age. Extremities:  1+ bilateral edema.   Neuro: Alert . Psych:  Normal affect, responds appropriately Skin: No rash   LABS: Basic Metabolic Panel:  Recent Labs  08/19/14 0542 08/20/14 0500  NA 136 136  K 4.1 4.1  CL 99 98  CO2 32 30  GLUCOSE 106* 106*  BUN 31* 37*  CREATININE 1.53* 1.98*  CALCIUM 8.1* 7.8*   Liver Function Tests:  Recent Labs  08/20/14 0500  AST 147*  ALT 113*  ALKPHOS 124*  BILITOT 2.0*  PROT 5.7*  ALBUMIN 2.2*   No results for input(s): LIPASE, AMYLASE in the last 72 hours. CBC:  Recent Labs  08/19/14 0540 08/20/14 0500  WBC 14.7* 15.7*  HGB 11.9* 10.9*  HCT 38.9 34.3*  MCV 98.2 96.9  PLT 256 242   Cardiac Enzymes: No results for input(s): CKTOTAL, CKMB, CKMBINDEX, TROPONINI in the last 72 hours. BNP: Invalid input(s): POCBNP D-Dimer: No results for input(s): DDIMER in the last 72 hours. Hemoglobin A1C: No results for input(s): HGBA1C in the last 72 hours. Fasting Lipid  Panel: No results for input(s): CHOL, HDL, LDLCALC, TRIG, CHOLHDL, LDLDIRECT in the last 72 hours. Thyroid Function Tests: No results for input(s): TSH, T4TOTAL, T3FREE, THYROIDAB in the last 72 hours.  Invalid input(s): FREET3 Anemia Panel: No results for input(s): VITAMINB12, FOLATE, FERRITIN, TIBC, IRON, RETICCTPCT in the last 72 hours. Coag Panel:   Lab Results  Component Value Date   INR 2.45* 08/20/2014   INR 2.66* 08/19/2014   INR 2.79* 08/18/2014    RADIOLOGY: Ct Abdomen Pelvis Wo Contrast  08/13/2014   CLINICAL DATA:  Bleeding.  No other indication provided.  EXAM: CT ABDOMEN AND PELVIS WITHOUT CONTRAST  TECHNIQUE: Multidetector CT imaging of the abdomen and pelvis was performed following the standard protocol without IV contrast.  COMPARISON:  08/10/2011  FINDINGS: Mild dependent changes in the lung bases. Diffuse cardiac enlargement. Postoperative changes in the mediastinum. Calcification in the mitral valve annulus.  Evaluation of solid organs and vascular structures is limited without IV contrast material. Circumscribed low-attenuation lesion in segment 4 of the liver measuring 2.3 x 3.6 cm. This is unchanged since prior study and probably represents a cyst. Appearance of the gallbladder is  nonspecific. There is is suggestion of mild gallbladder wall thickening and infiltration. Single tiny stone identified. Early changes of cholecystitis not excluded. Unenhanced appearance of the pancreas, spleen, kidneys, adrenal glands, and inferior vena cava are unremarkable. Calcification of the abdominal aorta without aneurysm. Low-attenuation lymph node between the aorta and IVC measures 12 mm in is likely reactive. Stomach, small bowel, and colon are decompressed. Scattered diverticula in the colon. No free air or free fluid in the abdomen. No abnormal retroperitoneal fluid collections.  Pelvis: Diverticulosis of the sigmoid colon without inflammatory change. Uterus appears surgically absent. No  pelvic mass or lymphadenopathy. No free or loculated pelvic fluid collections. Appendix is not identified. Bladder wall is not thickened. Small left inguinal hernia containing fat. Degenerative changes in the spine and hips.  IMPRESSION: Possible inflammatory changes around the gallbladder. Correlate clinically for suspicion of cholecystitis. Cyst in the liver. No acute process otherwise identified.   Electronically Signed   By: Lucienne Capers M.D.   On: 08/13/2014 20:24   Dg Lumbar Spine Complete  08/18/2014   CLINICAL DATA:  Right greater than left hip pain. Back pain for 5 days. Patient fell tissue is scanning from the toilet. Slipped and fell on the floor at home.  EXAM: LUMBAR SPINE - COMPLETE 4+ VIEW  COMPARISON:  CT of the abdomen and pelvis 08/13/2014  FINDINGS: There is normal alignment of lumbar spine. Degenerative changes are mild. There is dense calcification of the abdominal aorta.  IMPRESSION: No evidence for acute  abnormality.   Electronically Signed   By: Nolon Nations M.D.   On: 08/18/2014 16:36   Ct Head Wo Contrast  08/13/2014   CLINICAL DATA:  Rectal bleeding. Anti coagulation. Bruising along the back of the neck.  EXAM: CT HEAD WITHOUT CONTRAST  CT CERVICAL SPINE WITHOUT CONTRAST  TECHNIQUE: Multidetector CT imaging of the head and cervical spine was performed following the standard protocol without intravenous contrast. Multiplanar CT image reconstructions of the cervical spine were also generated.  COMPARISON:  10/14/2009  FINDINGS: CT HEAD FINDINGS  The brainstem, cerebellum, cerebral peduncles, thalamus, basal ganglia, basilar cisterns, and ventricular system appear within normal limits. Periventricular white matter and corona radiata hypodensities favor chronic ischemic microvascular white matter disease. No intracranial hemorrhage, mass lesion, or acute CVA.  Mild chronic right sphenoid sinusitis. Right mastoid effusion. There is atherosclerotic calcification of the cavernous  carotid arteries bilaterally.  CT CERVICAL SPINE FINDINGS  No cervical spine fracture or significant abnormal subluxation. No prevertebral soft tissue swelling or significant bony lesion observed. Clips are present in the left neck. No cervical spine  Clips are present in the left neck I do not observe a significant hematoma in the visualized portion of the neck, although please note that the entirety of the neck is not included. The spinous processes appear unremarkable.  IMPRESSION: 1. No acute intracranial findings or acute cervical spine findings. 2. Periventricular white matter and corona radiata hypodensities favor chronic ischemic microvascular white matter disease. 3. Right mastoid effusion with mild chronic right sphenoid sinusitis. 4. No significant hematoma is seen in the visualized portion of the the neck.   Electronically Signed   By: Van Clines M.D.   On: 08/13/2014 20:22   Ct Cervical Spine Wo Contrast  08/13/2014   CLINICAL DATA:  Rectal bleeding. Anti coagulation. Bruising along the back of the neck.  EXAM: CT HEAD WITHOUT CONTRAST  CT CERVICAL SPINE WITHOUT CONTRAST  TECHNIQUE: Multidetector CT imaging of the head and cervical spine  was performed following the standard protocol without intravenous contrast. Multiplanar CT image reconstructions of the cervical spine were also generated.  COMPARISON:  10/14/2009  FINDINGS: CT HEAD FINDINGS  The brainstem, cerebellum, cerebral peduncles, thalamus, basal ganglia, basilar cisterns, and ventricular system appear within normal limits. Periventricular white matter and corona radiata hypodensities favor chronic ischemic microvascular white matter disease. No intracranial hemorrhage, mass lesion, or acute CVA.  Mild chronic right sphenoid sinusitis. Right mastoid effusion. There is atherosclerotic calcification of the cavernous carotid arteries bilaterally.  CT CERVICAL SPINE FINDINGS  No cervical spine fracture or significant abnormal  subluxation. No prevertebral soft tissue swelling or significant bony lesion observed. Clips are present in the left neck. No cervical spine  Clips are present in the left neck I do not observe a significant hematoma in the visualized portion of the neck, although please note that the entirety of the neck is not included. The spinous processes appear unremarkable.  IMPRESSION: 1. No acute intracranial findings or acute cervical spine findings. 2. Periventricular white matter and corona radiata hypodensities favor chronic ischemic microvascular white matter disease. 3. Right mastoid effusion with mild chronic right sphenoid sinusitis. 4. No significant hematoma is seen in the visualized portion of the the neck.   Electronically Signed   By: Van Clines M.D.   On: 08/13/2014 20:22   Nm Hepatobiliary Including Gb  08/14/2014   CLINICAL DATA:  Abdominal pain.  Nausea.  Pancreatitis.  EXAM: NUCLEAR MEDICINE HEPATOBILIARY IMAGING  TECHNIQUE: Sequential images of the abdomen were obtained out to 60 minutes following intravenous administration of radiopharmaceutical.  RADIOPHARMACEUTICALS:  5.0 [employer] [id number] Choletec  COMPARISON:  None.  FINDINGS: Prompt radiopharmaceutical uptake by the liver is seen. Liver is normal in appearance.  Prompt biliary excretion of activity is seen. Gallbladder activity is seen initially on the 20 minutes image. Biliary activity reaches the small bowel initially on the 25 min image.  IMPRESSION: Normal hepatobiliary scan. No evidence of cystic duct or biliary obstruction   Electronically Signed   By: Earle Gell M.D.   On: 08/14/2014 14:22   Dg Chest Port 1 View  08/19/2014   CLINICAL DATA:  81 year old female with leukocytosis.  EXAM: PORTABLE CHEST - 1 VIEW  COMPARISON:  Prior chest x-ray 08/13/2014  FINDINGS: Stable cardiomegaly. Atherosclerotic and slightly tortuous thoracic aorta. Stable position of left subclavian approach cardiac rhythm maintenance device with leads  projecting over the right atrium and right ventricle. Slightly increased pulmonary vascular congestion and interstitial prominence with fluid tracking along the minor fissure suggesting mild interstitial edema. Unchanged linear atelectasis versus chronic pleural parenchymal scarring in the left base. No focal airspace consolidation, pleural effusion or pneumothorax. No acute osseous abnormality.  IMPRESSION: 1. Mild interstitial edema. In the setting of cardiomegaly this suggests early CHF. 2. Stable left basilar scarring versus chronic atelectasis. 3. No focal airspace consolidation to suggest pneumonia.   Electronically Signed   By: Jacqulynn Cadet M.D.   On: 08/19/2014 12:01   Dg Chest Port 1 View  08/13/2014   CLINICAL DATA:  Rectal bleeding. Dark colored urine for 3 days. Fall 10 days ago. Back pain.  EXAM: PORTABLE CHEST - 1 VIEW  COMPARISON:  06/25/2012  FINDINGS: Moderate enlargement of the cardiopericardial silhouette observed with indistinct pulmonary vasculature, cephalization of blood flow, and bilateral interstitial accentuation in the lungs. Dual lead pacer remains in place. Prior median sternotomy.  Atherosclerotic calcification of the aortic arch. Bandlike opacities in the retrocardiac region likely reflecting left lower lobe  atelectasis. Prior blunting of the costophrenic angles appears improved although this may be positional. The overall degree of interstitial edema appears mildly increased.  IMPRESSION: 1. Moderate enlargement of the cardiopericardial silhouette with interstitial edema. Reduced conspicuity of prior pleural effusions. 2. Suspected bandlike atelectasis at the left lung base. 3. Dual lead pacer remains in place.   Electronically Signed   By: Van Clines M.D.   On: 08/13/2014 20:33   Dg Hips Bilat With Pelvis 3-4 Views  08/18/2014   CLINICAL DATA:  Right greater than left hip pain. Back pain for 5 days. Status post fall while getting out from the toilet. Patient  slipped and fell on the floor at her home.  EXAM: BILATERAL HIP (WITH PELVIS) 3-4 VIEWS  COMPARISON:  None.  FINDINGS: There is no evidence of fracture or other focal bone lesions. Soft tissues are unremarkable.  IMPRESSION: Negative.   Electronically Signed   By: Nolon Nations M.D.   On: 08/18/2014 16:53   US Abdomen Limited Ruq  08/13/2014   CLINICAL DATA:  Abdominal pain. Recent CT with questionable inflammatory change about the gallbladder.  EXAM: US ABDOMEN LIMITED - RIGHT UPPER QUADRANT  COMPARISON:  CT abdomen/ pelvis earlier this day.  FINDINGS: Gallbladder:  Elongated spanning 11.2 cm. Contains dependent sludge and small echogenic stones. Normal gallbladder wall thickness of 2 mm. No pericholecystic fluid. No sonographic Murphy sign noted.  Common bile duct:  Diameter: 5 mm.  Liver:  Two hepatic cysts again seen, largest measures 3.5 x 2.1 x 2.9 cm. Otherwise within normal limits in parenchymal echogenicity.  IMPRESSION: 1. Distended gallbladder containing dependent sludge and small stones. There are no secondary sonographic findings of acute cholecystitis. Wall thickness is normal, there is a negative sonographic Murphy sign. No biliary dilatation. 2. Hepatic cysts.   Electronically Signed   By: Jeb Levering M.D.   On: 08/13/2014 22:49      ASSESSMENT: Carla Black:   1) Acute on chronic diastolic heart failure: ARF with Lasix yesterday. Agree with holding diuretics.  Consider restarting only when renal function normalizes.  Low albumin may explain 3rd spacing and renal failure with diuretics.  Encourage PO intake.  D/w Dr. Renne Crigler.  2) Mild to moderate MR by recent echo with EF 60-65%.  3) AFib: Warfarin for stroke prevention.  Carla Booze, MD  08/20/2014  8:16 AM

## 2014-08-20 NOTE — Progress Notes (Signed)
PROGRESS NOTE  Carla Black WEX:[id number] DOB: [birth year] DOA: 08/13/2014 PCP: Leandrew Koyanagi, MD  HPI: Carla Black is a 81 y.o. female with Past medical history of hypertension, dyslipidemia, HOCM, chronic combined CHF, chronic hypoxic respiratory failure, pacemaker implant, GERD, A. fib, peripheral vascular disease. The patient presented with complaints of abdominal pain and a fall, found to have acute pancreatitis, cholelithiasis, worsening of her CHF.  Subjective / 24 H Interval events - States she is still having abdominal pain. Finished her breakfast and had no problems. Complains of low back pain. Denies nausea, vomiting.   Assessment/Plan: Principal Problem:   Pancreatitis, acute Active Problems:   Abdominal pain   AKI (acute kidney injury)   Elevated LFTs   Acute on chronic combined systolic and diastolic heart failure   Supratherapeutic INR   BRBPR (bright red blood per rectum)   Abnormal gall bladder diagnostic imaging   Acute pancreatitis   Acute kidney injury   Leukocytosis  Leukocytosis  - increase in white count on 2/28 - CXR without pneumonia, UA not impressive for UTI - increased WBC today, with increase LFTs/Bilirubin, started Zosyn - Patient without significant symptoms such as dysuria or cough/chest congestion   Acute pancreatitis / abdominal pain / poor po intake  - possibly related to cholelithiasis - general surgery followed, signed off. GI consulted, appreciate input - gently hydrated, improving, now undergoing diuresis  - currently tolerating a diet   HOCM / Mitral regurgitation / acute on chronic diastolic heart failure - repeated 2D echo with severe LVH, EF 60-65%, aortic stenosis/regurgitation, mitral valve regurgitation - hydrated gently for #1, now diuresing - appreciate cardiology assistance, renal function slightly up today, will discontinue Lasix  Back/hip pain - patient with increased pain in hips/back - X-ray without  acute findings  A fib  - now pacemaker dependent - coumadin per pharmacy  Supratherapeutic INR  - resolved  Bright red blood per rectum - no longer reported  Acute on chronic renal failure - CKD stage III - likely due to dehydration, improved with fluids - continue to closely monitor  Elevated LFTs with elevated bilirubin - no CBD dilatation, GI following - improving  Diet: Diet Heart Fluids: none  DVT Prophylaxis: Coumadin  Code Status: Full Code Family Communication: d/w patient and husband Disposition Plan: inpatient  Consultants:  General Surgery - s/o  GI  Cardiology   Procedures:  None    Antibiotics  Anti-infectives    Start     Dose/Rate Route Frequency Ordered Stop   08/20/14 1000  piperacillin-tazobactam (ZOSYN) IVPB 3.375 g     3.375 g 12.5 mL/hr over 240 Minutes Intravenous Every 8 hours 08/20/14 0920         Studies  Dg Lumbar Spine Complete  08/18/2014   CLINICAL DATA:  Right greater than left hip pain. Back pain for 5 days. Patient fell tissue is scanning from the toilet. Slipped and fell on the floor at home.  EXAM: LUMBAR SPINE - COMPLETE 4+ VIEW  COMPARISON:  CT of the abdomen and pelvis 08/13/2014  FINDINGS: There is normal alignment of lumbar spine. Degenerative changes are mild. There is dense calcification of the abdominal aorta.  IMPRESSION: No evidence for acute  abnormality.   Electronically Signed   By: Nolon Nations M.D.   On: 08/18/2014 16:36   Dg Chest Port 1 View  08/19/2014   CLINICAL DATA:  81 year old female with leukocytosis.  EXAM: PORTABLE CHEST - 1 VIEW  COMPARISON:  Prior  chest x-ray 08/13/2014  FINDINGS: Stable cardiomegaly. Atherosclerotic and slightly tortuous thoracic aorta. Stable position of left subclavian approach cardiac rhythm maintenance device with leads projecting over the right atrium and right ventricle. Slightly increased pulmonary vascular congestion and interstitial prominence with fluid tracking along  the minor fissure suggesting mild interstitial edema. Unchanged linear atelectasis versus chronic pleural parenchymal scarring in the left base. No focal airspace consolidation, pleural effusion or pneumothorax. No acute osseous abnormality.  IMPRESSION: 1. Mild interstitial edema. In the setting of cardiomegaly this suggests early CHF. 2. Stable left basilar scarring versus chronic atelectasis. 3. No focal airspace consolidation to suggest pneumonia.   Electronically Signed   By: Jacqulynn Cadet M.D.   On: 08/19/2014 12:01   Dg Hips Bilat With Pelvis 3-4 Views  08/18/2014   CLINICAL DATA:  Right greater than left hip pain. Back pain for 5 days. Status post fall while getting out from the toilet. Patient slipped and fell on the floor at her home.  EXAM: BILATERAL HIP (WITH PELVIS) 3-4 VIEWS  COMPARISON:  None.  FINDINGS: There is no evidence of fracture or other focal bone lesions. Soft tissues are unremarkable.  IMPRESSION: Negative.   Electronically Signed   By: Nolon Nations M.D.   On: 08/18/2014 16:53    Objective  Filed Vitals:   08/19/14 1955 08/19/14 2137 08/20/14 0620 08/20/14 0644  BP:  140/74 152/59   Pulse:  61 60   Temp:  97.6 F (36.4 C) 98 F (36.7 C)   TempSrc:  Oral Oral   Resp:  18 20   Height:      Weight:    83.5 kg (184 lb 1.4 oz)  SpO2: 98% 99% 100%     Intake/Output Summary (Last 24 hours) at 08/20/14 0718 Last data filed at 08/20/14 0644  Gross per 24 hour  Intake    240 ml  Output    600 ml  Net   -360 ml   Filed Weights   08/18/14 0449 08/19/14 0628 08/20/14 0644  Weight: 83.19 kg (183 lb 6.4 oz) 84.1 kg (185 lb 6.5 oz) 83.5 kg (184 lb 1.4 oz)    Exam:  General:  NAD  HEENT: no scleral icterus  Cardiovascular: RRR  Respiratory: bibasilar crackles, no wheezing  Abdomen: soft, tender in the epigastric area, BS +  MSK/Extremities: 2+ pitting LE edema  Skin: no rashes  Neuro: non focal   Data Reviewed: Basic Metabolic Panel:  Recent  Labs Lab 08/16/14 0500 08/17/14 0515 08/18/14 0550 08/19/14 0542 08/20/14 0500  NA 141 138 139 136 136  K 5.0 4.2 4.4 4.1 4.1  CL 104 101 99 99 98  CO2 31 30 32 32 30  GLUCOSE 92 96 101* 106* 106*  BUN 40* 29* 25* 31* 37*  CREATININE 1.62* 1.38* 1.20* 1.53* 1.98*  CALCIUM 8.5 8.4 8.5 8.1* 7.8*   Liver Function Tests:  Recent Labs Lab 08/14/14 0507 08/15/14 0540 08/16/14 0500 08/17/14 0515 08/20/14 0500  AST 110* 87* 66* 67* 147*  ALT 147* 137* 106* 99* 113*  ALKPHOS 172* 180* 147* 146* 124*  BILITOT 1.5* 2.3* 1.6* 1.6* 2.0*  PROT 6.1 6.9 6.3 6.5 5.7*  ALBUMIN 2.5* 2.8* 2.6* 2.7* 2.2*    Recent Labs Lab 08/13/14 1733 08/14/14 0507 08/15/14 0540 08/17/14 0515  LIPASE 606* 307* 145* 65*   CBC:  Recent Labs Lab 08/13/14 1733 08/14/14 0010 08/15/14 0540 08/16/14 0500 08/19/14 0540 08/20/14 0500  WBC 8.2 7.9 7.1 6.8 14.7* 15.7*  NEUTROABS 6.9 5.9  --   --   --   --   HGB 12.4 12.1 13.4 12.2 11.9* 10.9*  HCT 39.0 38.5 42.6 39.4 38.9 34.3*  MCV 95.8 95.8 97.9 98.0 98.2 96.9  PLT 221 217 231 254 256 242   Cardiac Enzymes:  Recent Labs Lab 08/13/14 1733 08/14/14 0010 08/14/14 0507 08/14/14 1405  TROPONINI 0.05* 0.04* 0.03 0.03   BNP (last 3 results)  Recent Labs  08/13/14 1733  BNP 1326.6*   Scheduled Meds: . amiodarone  200 mg Oral Daily  . antiseptic oral rinse  7 mL Mouth Rinse q12n4p  . aspirin EC  81 mg Oral Daily  . chlorhexidine  15 mL Mouth Rinse BID  . diltiazem  180 mg Oral QPM  . ferrous sulfate  325 mg Oral Q breakfast  . fluticasone  2 spray Each Nare Daily  . folic acid  1 mg Oral Daily  . hydrocortisone  25 mg Rectal BID  . levalbuterol  0.315 mg Nebulization TID  . levothyroxine  100 mcg Oral QAC breakfast  . loratadine  10 mg Oral Daily  . meclizine  25 mg Oral QHS  . metoprolol  150 mg Oral BID  . sodium chloride  3 mL Intravenous Q12H  . Warfarin - Pharmacist Dosing Inpatient   Does not apply q1800   Continuous  Infusions:    Marzetta Board, MD [facility] Pager [phone]. If 7 PM - 7 AM, please contact night-coverage at [email or web address], password [facility] 08/20/2014, 7:18 AM  LOS: 7 days

## 2014-08-20 NOTE — Progress Notes (Signed)
ANTICOAGULATION CONSULT NOTE - Follow up  Pharmacy Consult for warfarin Indication: atrial fibrillation  Allergies  Allergen Reactions  . Pseudoephedrine Other (See Comments)    Insomnia (took 1 tablet and could not sleep for 3 days)  . Statins Other (See Comments)    myalgia  . Codeine Nausea Only and Other (See Comments)    dizziness  . Seldane [Terfenadine] Nausea Only and Other (See Comments)    dizziness    Patient Measurements: Height: 5\' 3"  (160 cm) Weight: 184 lb 1.4 oz (83.5 kg) IBW/kg (Calculated) : 52.4   Vital Signs: Temp: 97.9 F (36.6 C) (02/29 1007) Temp Source: Axillary (02/29 1007) BP: 144/121 mmHg (02/29 1007) Pulse Rate: 59 (02/29 1007)  Labs:  Recent Labs  08/18/14 0550 08/19/14 0540 08/19/14 0542 08/20/14 0500  HGB  --  11.9*  --  10.9*  HCT  --  38.9  --  34.3*  PLT  --  256  --  242  LABPROT 29.7* 28.5*  --  26.8*  INR 2.79* 2.66*  --  2.45*  CREATININE 1.20*  --  1.53* 1.98*    Estimated Creatinine Clearance: 21.6 mL/min (by C-G formula based on Cr of 1.98).   Medications:  Scheduled:  . amiodarone  200 mg Oral Daily  . antiseptic oral rinse  7 mL Mouth Rinse q12n4p  . aspirin EC  81 mg Oral Daily  . chlorhexidine  15 mL Mouth Rinse BID  . diltiazem  180 mg Oral QPM  . ferrous sulfate  325 mg Oral Q breakfast  . fluticasone  2 spray Each Nare Daily  . folic acid  1 mg Oral Daily  . hydrocortisone  25 mg Rectal BID  . levalbuterol  0.315 mg Nebulization TID  . levothyroxine  100 mcg Oral QAC breakfast  . loratadine  10 mg Oral Daily  . meclizine  25 mg Oral QHS  . metoprolol  150 mg Oral BID  . piperacillin-tazobactam (ZOSYN)  IV  3.375 g Intravenous Q8H  . sodium chloride  3 mL Intravenous Q12H    Assessment: 81 y.o. female with a significant cardiac history on chronic Coumadin for atrial fibrillation who presented to ED 2/22 with acute abdominal pain, weakness and a fall. She has chronic, intermittent loose stools but 2/21  had several episodes of diarrhea followed by rectal bleeding. Admission INR SUPRA-therapeutic. Pharmacy consulted to continue warfarin while inpatient.  -Per last anti-coag note on 1/29 warfarin dose is 2.5mg  daily except 1.25mg  on Mon and Fri. -pt has had poor appetite for several months   Significant events: 2/22-25: INR supratherapeutic. No Coumadin. 2/26: Coumadin resumed at low dose  Today, 08/20/2014: INR remains therapeutic.  See MAR for recent dosages administered. Hgb falling. No overt bleeding reported. Pltc WNL SCr continues to rise. TBili rising Now on heart healthy diet. Amiodarone and aspirin from home continued. GI wants warfarin held in case invasive procedures are required within next few days.  Order for pharmacy dosing consult was discontinued for now.  Plan:  Holding warfarin per orders from gastroenterology until specifically resumed by them or another provider.  Clayburn Pert, PharmD, BCPS Pager: [phone] 08/20/2014  10:36 AM

## 2014-08-20 NOTE — Progress Notes (Signed)
Patient ID: Carla Black, female   DOB: 27-Nov-1927, 81 y.o.   MRN: [id number]    Progress Note   Subjective  SOB sitting up. C/o abdominal discomfort "not pain", some nausea off and on.Says "I  know I'm not making any sense"..    Objective   Vital signs in last 24 hours: Temp:  [97.6 F (36.4 C)-98 F (36.7 C)] 98 F (36.7 C) (02/29 0620) Pulse Rate:  [59-62] 60 (02/29 0620) Resp:  [18-20] 20 (02/29 0620) BP: (126-152)/(47-80) 152/59 mmHg (02/29 0620) SpO2:  [72 %-100 %] 100 % (02/29 0620) Weight:  [184 lb 1.4 oz (83.5 kg)] 184 lb 1.4 oz (83.5 kg) (02/29 0644) Last BM Date: 08/14/14 General:  Elderly   white female in NAD  Icteric Dypsneic Heart:  Regular rate and rhythm; no murmurs Lungs: decreased BS bilaterally, crackles bases Abdomen:  Soft, tender  Across upper abdomen and nondistended.  bowel sounds decreased. Extremities:  Without edema. Neurologic:  Alert   grossly normal neurologically- answers appropriately but response time very slow. Psych:  Cooperative. Intake/Output from previous day: 02/28 0701 - 02/29 0700 In: 240 [P.O.:240] Out: 600 [Urine:600] Intake/Output this shift:    Lab Results:  Recent Labs  08/19/14 0540 08/20/14 0500  WBC 14.7* 15.7*  HGB 11.9* 10.9*  HCT 38.9 34.3*  PLT 256 242   BMET  Recent Labs  08/18/14 0550 08/19/14 0542 08/20/14 0500  NA 139 136 136  K 4.4 4.1 4.1  CL 99 99 98  CO2 32 32 30  GLUCOSE 101* 106* 106*  BUN 25* 31* 37*  CREATININE 1.20* 1.53* 1.98*  CALCIUM 8.5 8.1* 7.8*   LFT  Recent Labs  08/20/14 0500  PROT 5.7*  ALBUMIN 2.2*  AST 147*  ALT 113*  ALKPHOS 124*  BILITOT 2.0*   PT/INR  Recent Labs  08/19/14 0540 08/20/14 0500  LABPROT 28.5* 26.8*  INR 2.66* 2.45*    Studies/Results: Dg Lumbar Spine Complete  08/18/2014   CLINICAL DATA:  Right greater than left hip pain. Back pain for 5 days. Patient fell tissue is scanning from the toilet. Slipped and fell on the floor at home.   EXAM: LUMBAR SPINE - COMPLETE 4+ VIEW  COMPARISON:  CT of the abdomen and pelvis 08/13/2014  FINDINGS: There is normal alignment of lumbar spine. Degenerative changes are mild. There is dense calcification of the abdominal aorta.  IMPRESSION: No evidence for acute  abnormality.   Electronically Signed   By: Nolon Nations M.D.   On: 08/18/2014 16:36   Dg Chest Port 1 View  08/19/2014   CLINICAL DATA:  81 year old female with leukocytosis.  EXAM: PORTABLE CHEST - 1 VIEW  COMPARISON:  Prior chest x-ray 08/13/2014  FINDINGS: Stable cardiomegaly. Atherosclerotic and slightly tortuous thoracic aorta. Stable position of left subclavian approach cardiac rhythm maintenance device with leads projecting over the right atrium and right ventricle. Slightly increased pulmonary vascular congestion and interstitial prominence with fluid tracking along the minor fissure suggesting mild interstitial edema. Unchanged linear atelectasis versus chronic pleural parenchymal scarring in the left base. No focal airspace consolidation, pleural effusion or pneumothorax. No acute osseous abnormality.  IMPRESSION: 1. Mild interstitial edema. In the setting of cardiomegaly this suggests early CHF. 2. Stable left basilar scarring versus chronic atelectasis. 3. No focal airspace consolidation to suggest pneumonia.   Electronically Signed   By: Jacqulynn Cadet M.D.   On: 08/19/2014 12:01   Dg Hips Bilat With Pelvis 3-4 Views  08/18/2014  CLINICAL DATA:  Right greater than left hip pain. Back pain for 5 days. Status post fall while getting out from the toilet. Patient slipped and fell on the floor at her home.  EXAM: BILATERAL HIP (WITH PELVIS) 3-4 VIEWS  COMPARISON:  None.  FINDINGS: There is no evidence of fracture or other focal bone lesions. Soft tissues are unremarkable.  IMPRESSION: Negative.   Electronically Signed   By: Nolon Nations M.D.   On: 08/18/2014 16:53       Assessment / Plan:   #1 81 yo female with acute  pancreatitis ,presumed biliary- initial imaging on 2/22 CT non contrasted read pancreas as normal, no ductal dilation, and [place] same . She is not improving- WBC on the rise,Tbili on the rise, renal function deteriorating. Unfortunately she is very high risk for complications with any procedures  -with acute CHF.  ? CBD stone? cholecystitis #2 Anticoagulated-on Coumadin- need to hold in event procedures to be done in next few days #3 CAD #4 mitral insuff  #5 AKI #6 atrial fib  Plan; start Zosyn Repeat US today- we are limited by Pacemaker(no MRI), and kidney function. In event ERCP to be done she will need gen anesthesia and likely vent support  Hold coumadin.   Principal Problem:   Pancreatitis, acute Active Problems:   Abdominal pain   AKI (acute kidney injury)   Elevated LFTs   Acute on chronic combined systolic and diastolic heart failure   Supratherapeutic INR   BRBPR (bright red blood per rectum)   Abnormal gall bladder diagnostic imaging   Acute pancreatitis   Acute kidney injury   Leukocytosis     LOS: 7 days   Amy Esterwood  08/20/2014, 9:01 AM  GI Attending Note  I have personally taken an interval history, reviewed the chart, and examined the patient.  I agree with the extender's note, impression and recommendations.  Difficult to determine whether she has retained bile duct stones.  Doubt impacted stone.  Any intervention carries a high risk in view of the patient's comorbidities.  Await repeat abdominal ultrasound looking for new dilation of the bile duct  Sandy Salaam. Deatra Ina, MD, [facility] [phone]

## 2014-08-20 NOTE — Progress Notes (Signed)
Clinical Social Work Department CLINICAL SOCIAL WORK PLACEMENT NOTE 08/20/2014  Patient:  Carla Black, Carla Black  Account Number:  [id number] [place] date:  08/13/2014  Clinical Social Worker:  Renold Genta  Date/time:  08/20/2014 02:29 PM  Clinical Social Work is seeking post-discharge placement for this patient at the following level of care:   SKILLED NURSING   (*CSW will update this form in Epic as items are completed)   08/20/2014  Patient/family provided with [place] Department of Clinical Social Work's list of facilities offering this level of care within the geographic area requested by the patient (or if unable, by the patient's family).  08/20/2014  Patient/family informed of their freedom to choose among providers that offer the needed level of care, that participate in Medicare, Medicaid or managed care program needed by the patient, have an available bed and are willing to accept the patient.  08/20/2014  Patient/family informed of [facility]' ownership interest in [facility], as well as of the fact that they are under no obligation to receive care at this facility.  PASARR submitted to EDS on 08/20/2014 PASARR number received on 08/20/2014  FL2 transmitted to all facilities in geographic area requested by pt/family on  08/20/2014 FL2 transmitted to all facilities within larger geographic area on   Patient informed that his/her managed care company has contracts with or will negotiate with  certain facilities, including the following:     Patient/family informed of bed offers received:   Patient chooses bed at  Physician recommends and patient chooses bed at    Patient to be transferred to  on   Patient to be transferred to facility by  Patient and family notified of transfer on  Name of family member notified:    The following physician request were entered in Epic:   Additional Comments:    Raynaldo Opitz, [place] Social Worker cell #: [phone]

## 2014-08-20 NOTE — Progress Notes (Signed)
Physical Therapy Treatment Patient Details Name: Carla Black MRN: [id number] DOB: 07-13-1927 Today's Date: 08/20/2014    History of Present Illness 81 yo female admitted with acute pancreatitis, fall. hx of HTn, CHF, pacemaker, A fib, PVD, CVA. [facility].     PT Comments    Assisted pt from bed to recliner + 2 assist for safety then B knees buckled twice so did not attempt amb.    Follow Up Recommendations  SNF     Equipment Recommendations       Recommendations for Other Services       Precautions / Restrictions      Mobility  Bed Mobility Overal bed mobility: Needs Assistance Bed Mobility: Supine to Sit     Supine to sit: Max assist     General bed mobility comments: Assist for LEs. Increased time.   Transfers Overall transfer level: Needs assistance Equipment used: Rolling walker (2 wheeled) Transfers: Sit to/from Stand Sit to Stand: Min assist;Mod assist         General transfer comment: Assist to rise, stabilize, control descent. VCS safety, technique, hand placement.   Ambulation/Gait             General Gait Details: did not attempt amb due to B knees buckled twice during transfer from bed to recliner.     Stairs            Wheelchair Mobility    Modified Rankin (Stroke Patients Only)       Balance                                    Cognition                            Exercises      General Comments        Pertinent Vitals/Pain      Home Living                      Prior Function            PT Goals (current goals can now be found in the care plan section) Progress towards PT goals: Progressing toward goals    Frequency  Min 3X/week    PT Plan      Co-evaluation             End of Session Equipment Utilized During Treatment: Gait belt;Oxygen Activity Tolerance: Patient limited by fatigue Patient left: with call bell/phone within reach      Time: 1202-1220 PT Time Calculation (min) (ACUTE ONLY): 18 min  Charges:  $Gait Training: 8-22 mins                    G Codes:      Rica Koyanagi  PTA [facility]  Acute  Rehab Pager      [phone]

## 2014-08-21 ENCOUNTER — Encounter (HOSPITAL_COMMUNITY): Payer: Self-pay | Admitting: Interventional Cardiology

## 2014-08-21 DIAGNOSIS — I4891 Unspecified atrial fibrillation: Secondary | ICD-10-CM | POA: Diagnosis present

## 2014-08-21 DIAGNOSIS — I5033 Acute on chronic diastolic (congestive) heart failure: Secondary | ICD-10-CM | POA: Diagnosis present

## 2014-08-21 DIAGNOSIS — Z7189 Other specified counseling: Secondary | ICD-10-CM

## 2014-08-21 LAB — CBC
HCT: 36.5 % (ref 36.0–46.0)
Hemoglobin: 11.6 g/dL — ABNORMAL LOW (ref 12.0–15.0)
MCH: 30.4 pg (ref 26.0–34.0)
MCHC: 31.8 g/dL (ref 30.0–36.0)
MCV: 95.5 fL (ref 78.0–100.0)
PLATELETS: 235 10*3/uL (ref 150–400)
RBC: 3.82 MIL/uL — AB (ref 3.87–5.11)
RDW: 15.7 % — AB (ref 11.5–15.5)
WBC: 16.3 10*3/uL — AB (ref 4.0–10.5)

## 2014-08-21 LAB — COMPREHENSIVE METABOLIC PANEL
ALK PHOS: 127 U/L — AB (ref 39–117)
ALT: 194 U/L — ABNORMAL HIGH (ref 0–35)
ANION GAP: 10 (ref 5–15)
AST: 320 U/L — ABNORMAL HIGH (ref 0–37)
Albumin: 2.3 g/dL — ABNORMAL LOW (ref 3.5–5.2)
BILIRUBIN TOTAL: 2 mg/dL — AB (ref 0.3–1.2)
BUN: 44 mg/dL — AB (ref 6–23)
CHLORIDE: 96 mmol/L (ref 96–112)
CO2: 28 mmol/L (ref 19–32)
CREATININE: 2.07 mg/dL — AB (ref 0.50–1.10)
Calcium: 7.6 mg/dL — ABNORMAL LOW (ref 8.4–10.5)
GFR calc Af Amer: 24 mL/min — ABNORMAL LOW (ref >90)
GFR calc non Af Amer: 21 mL/min — ABNORMAL LOW (ref >90)
Glucose, Bld: 111 mg/dL — ABNORMAL HIGH (ref 70–99)
Potassium: 4 mmol/L (ref 3.5–5.1)
Sodium: 134 mmol/L — ABNORMAL LOW (ref 135–145)
Total Protein: 5.6 g/dL — ABNORMAL LOW (ref 6.0–8.3)

## 2014-08-21 LAB — BRAIN NATRIURETIC PEPTIDE: B NATRIURETIC PEPTIDE 5: 878.4 pg/mL — AB (ref 0.0–100.0)

## 2014-08-21 LAB — PROTIME-INR
INR: 2.13 — AB (ref 0.00–1.49)
PROTHROMBIN TIME: 24 s — AB (ref 11.6–15.2)

## 2014-08-21 MED ORDER — CIPROFLOXACIN HCL 500 MG PO TABS: 500.0000 mg | ORAL_TABLET | ORAL | Status: DC

## 2014-08-21 MED ORDER — MORPHINE SULFATE (CONCENTRATE) 10 MG/0.5ML PO SOLN
5.0000 mg | ORAL | Status: DC | PRN
  Administered 2014-08-21 – 2014-08-22 (×2): 5 mg via ORAL
  Filled 2014-08-21 (×2): qty 0.5

## 2014-08-21 NOTE — Progress Notes (Signed)
Patient ID: Carla Black, female   DOB: July 27, 1927, 81 y.o.   MRN: [id number]   Met with patient, husband and daughter in pt's room . Discussed course ,multisystem disease and slow decline. Pt has made it very clear (and did so again this afternoon  With family present), that she does not want anymore treatment  of any kind  Other than comfort care. She does not want IV fluids or antibiotics etc. She would like an IV so Pain meds etc can be given . She would like to remain in hospital for palliative care. Family is in agreement with her wishes. Will d/c antibiotics,oral iron .

## 2014-08-21 NOTE — Progress Notes (Signed)
Pt has stated displeasure with BID suppositories due to bilateral hip pain. Pt was tearful with turns and given pain medicine, but has asked to not receive suppository anymore if possible due to pain with rolling. Will alert day RN to discuss with MD during rounding. Will continue to monitor.

## 2014-08-21 NOTE — Progress Notes (Signed)
Patient ID: Carla Black, female   DOB: 1928/03/15, 81 y.o.   MRN: [id number]    Progress Note   Subjective  Pt feels bad, weak, and tells me she is done- she wants to" be with the [place]". She says she doesn't want anything else done,or anything else started. Clearly says she wants to be comfortable , and no CPR etc. Not eating, still c/o abdominal pain and SOB   Objective   Vital signs in last 24 hours: Temp:  [97.6 F (36.4 C)-98.6 F (37 C)] 98.6 F (37 C) (03/01 0511) Pulse Rate:  [59] 59 (03/01 0511) Resp:  [18-20] 18 (03/01 0511) BP: (109-135)/(45-83) 135/83 mmHg (03/01 0511) SpO2:  [97 %-98 %] 97 % (03/01 0511) Weight:  [186 lb 4.6 oz (84.5 kg)] 186 lb 4.6 oz (84.5 kg) (03/01 0511) Last BM Date: 08/20/14 General:    white female in NAD,ill  Heart:  Regular rate and rhythm; no murmurs Lungs: Respirations even and unlabored,decreased BS bases Abdomen:  Soft, tender across upper abdomen   bowel sounds decreased. Extremities:  Without edema. Neurologic:  Alert and oriented,  grossly normal neurologically.,speech very slow but appropriate  d and affec Intake/Output from previous day: 02/29 0701 - 03/01 0700 In: 630 [P.O.:480; IV Piggyback:150] Out: -  Intake/Output this shift:    Lab Results:  Recent Labs  08/19/14 0540 08/20/14 0500 08/21/14 0540  WBC 14.7* 15.7* 16.3*  HGB 11.9* 10.9* 11.6*  HCT 38.9 34.3* 36.5  PLT 256 242 235   BMET  Recent Labs  08/19/14 0542 08/20/14 0500 08/21/14 0540  NA 136 136 134*  K 4.1 4.1 4.0  CL 99 98 96  CO2 32 30 28  GLUCOSE 106* 106* 111*  BUN 31* 37* 44*  CREATININE 1.53* 1.98* 2.07*  CALCIUM 8.1* 7.8* 7.6*   LFT  Recent Labs  08/21/14 0540  PROT 5.6*  ALBUMIN 2.3*  AST 320*  ALT 194*  ALKPHOS 127*  BILITOT 2.0*   PT/INR  Recent Labs  08/20/14 0500 08/21/14 0540  LABPROT 26.8* 24.0*  INR 2.45* 2.13*    Studies/Results: US Abdomen Complete  08/20/2014   CLINICAL DATA:  Subsequent encounter  for abnormal gallbladder.  EXAM: ULTRASOUND ABDOMEN COMPLETE  COMPARISON:  Ultrasound exam 08/13/2014.  CT scan from 08/13/2014.  FINDINGS: Gallbladder: Gallbladder is moderately distended. Tiny dependent stones measure about 5 mm. There is no gallbladder wall thickening. No pericholecystic fluid. The sonographer reports no sonographic Murphy sign.  Common bile duct: Diameter: 3 mm  Liver: Cystic areas are evident within the liver parenchyma. These were seen on the recent CT scan and are stable comparing back to a CT scan from 08/10/2011. Imaging features are most consistent with cysts.  IVC: No abnormality visualized.  Pancreas: Visualized portion unremarkable.  Spleen: Size and appearance within normal limits.  Right Kidney: Length: 9.8 cm. Echogenicity within normal limits. No mass or hydronephrosis visualized.  Left Kidney: Length: 10.7 cm. Echogenicity within normal limits. No mass or hydronephrosis visualized.  Abdominal aorta: Not well seen secondary to bowel gas.  Other findings: None.  IMPRESSION: Probable tiny gallstone with distended gallbladder. No pericholecystic fluid or gallbladder wall thickening. The sonographer reports no sonographic Murphy sign.   Electronically Signed   By: Misty Stanley M.D.   On: 08/20/2014 15:54   Dg Chest Port 1 View  08/19/2014   CLINICAL DATA:  81 year old female with leukocytosis.  EXAM: PORTABLE CHEST - 1 VIEW  COMPARISON:  Prior chest x-ray 08/13/2014  FINDINGS: Stable cardiomegaly. Atherosclerotic and slightly tortuous thoracic aorta. Stable position of left subclavian approach cardiac rhythm maintenance device with leads projecting over the right atrium and right ventricle. Slightly increased pulmonary vascular congestion and interstitial prominence with fluid tracking along the minor fissure suggesting mild interstitial edema. Unchanged linear atelectasis versus chronic pleural parenchymal scarring in the left base. No focal airspace consolidation, pleural effusion  or pneumothorax. No acute osseous abnormality.  IMPRESSION: 1. Mild interstitial edema. In the setting of cardiomegaly this suggests early CHF. 2. Stable left basilar scarring versus chronic atelectasis. 3. No focal airspace consolidation to suggest pneumonia.   Electronically Signed   By: Jacqulynn Cadet M.D.   On: 08/19/2014 12:01       Assessment / Plan:   #1 81 yo female with multiple serious medical problems presenting after a fall with pancreatitis and now acute on chronic CHF. She is doing poorly and asking for comfort care. Palliative care consult to be called- Dr Renne Crigler has discussed with family who still has a lot of questions. US shows normal CBD..making choledocholithiasis unlikely, pancreas also grossly looked normal  LFT's are on the rise, as is WBC . Would benefit from further imaging to help sort out but unable to do MRI with pacer, and cannot give IV contrast for Ct though could repeat CT Will discuss with family On Zosyn #2 CHF #3 AKI  #4 Atrial fib #5 chronic anticoagulation - Coumadin was placed on hold -no ERCP planned will defer to medicine    Principal Problem:   Pancreatitis, acute Active Problems:   Abdominal pain   AKI (acute kidney injury)   Elevated LFTs   Acute on chronic combined systolic and diastolic heart failure   Supratherapeutic INR   BRBPR (bright red blood per rectum)   Abnormal gall bladder diagnostic imaging   Acute pancreatitis   Acute kidney injury   Leukocytosis   Atrial fibrillation   Acute on chronic diastolic heart failure     LOS: 8 days   Amy Esterwood  08/21/2014, 11:00 AM   GI Attending Note  I have personally taken an interval history, reviewed the chart, and examined the patient.  I agree with the extender's note, impression and recommendations.  At this point the patient's medical problems including congestive heart failure and renal insufficiency are becoming increasingly severe.  As stated above, LFT abnormalities may  reflect passive congestion of the liver.  Persistent coagulopathy off of Coumadin speaks to this.  There is no evidence for cholangitis or choledocholithiasis (bile duct is not dilated).  Do not anticipate any endoscopic intervention.  Sandy Salaam. Deatra Ina, MD, [facility] [phone]

## 2014-08-21 NOTE — Progress Notes (Signed)
Patient refused new IV restart to give her antibiotics, she told IV-team nurse that she does not want another IV and I reinforced to patient the reason for the IV but she still stated she does not want IV stated, Dr. Cruzita Lederer and family informed. Patient refusing to be repositioned or turn. Will continue to assess patient.

## 2014-08-21 NOTE — Care Management Note (Addendum)
    Page 1 of 1   08/22/2014     4:19:03 PM CARE MANAGEMENT NOTE 08/22/2014  Patient:  [facility]   Account Number:  [id number]  Date Initiated:  08/15/2014  Documentation initiated by:  Karl Bales  Subjective/Objective Assessment:   Pt admitted with cco N, V with Pancreatitis     Action/Plan:   from home   Anticipated DC Date:  08/22/2014   Anticipated DC Plan:  [place] referral  Clinical Social Worker         Choice offered to / List presented to:             Status of service:  Completed, signed off Medicare Important Message given?   (If response is "NO", the following Medicare IM given date fields will be blank) Date Medicare IM given:   Medicare IM given by:   Date Additional Medicare IM given:   Additional Medicare IM given by:    Discharge Disposition:  [place]  Per UR Regulation:  Reviewed for med. necessity/level of care/duration of stay  If discussed at [place] of Stay Meetings, dates discussed:   08/20/2014    Comments:  08/22/14 Dessa Phi RN BSN NCm [phone] d/c [facility].  08/21/14 Dessa Phi RN BSN NCM [phone] PMT today-await recommendations.  08/15/14 MMcGibboney, RN, BSN Chart reviewed.

## 2014-08-21 NOTE — Progress Notes (Signed)
SUBJECTIVE:  Has SHOB with lying flat. No diuretics yesterday due to ARF.  Uncomfortable due to needing to have bowel movement.  OBJECTIVE:   Vitals:   Filed Vitals:   08/20/14 1710 08/20/14 2018 08/20/14 2121 08/21/14 0511  BP: 110/52  120/48 135/83  Pulse:   59 59  Temp:   97.6 F (36.4 C) 98.6 F (37 C)  TempSrc:   Oral Oral  Resp:   18 18  Height:      Weight:    186 lb 4.6 oz (84.5 kg)  SpO2:  98% 98% 97%   I&O's:    Intake/Output Summary (Last 24 hours) at 08/21/14 6720 Last data filed at 08/21/14 0600  Gross per 24 hour  Intake    630 ml  Output      0 ml  Net    630 ml   TELEMETRY: Reviewed telemetry pt in AV paced:     PHYSICAL EXAM General: Well developed, well nourished, appears mildly uncomfortable Head:   Normal cephalic and atramatic  Lungs:  No wheezing. Heart:   HRRR S1 S2  No JVD.   Abdomen: abdomen soft and non-tender Msk:  Back normal,  Normal strength and tone for age. Extremities:  1+ bilateral edema.   Neuro: Alert . Psych:  Normal affect, responds appropriately Skin: No rash   LABS: Basic Metabolic Panel:  Recent Labs  08/19/14 0542 08/20/14 0500  NA 136 136  K 4.1 4.1  CL 99 98  CO2 32 30  GLUCOSE 106* 106*  BUN 31* 37*  CREATININE 1.53* 1.98*  CALCIUM 8.1* 7.8*   Liver Function Tests:  Recent Labs  08/20/14 0500  AST 147*  ALT 113*  ALKPHOS 124*  BILITOT 2.0*  PROT 5.7*  ALBUMIN 2.2*   No results for input(s): LIPASE, AMYLASE in the last 72 hours. CBC:  Recent Labs  08/19/14 0540 08/20/14 0500  WBC 14.7* 15.7*  HGB 11.9* 10.9*  HCT 38.9 34.3*  MCV 98.2 96.9  PLT 256 242   Cardiac Enzymes: No results for input(s): CKTOTAL, CKMB, CKMBINDEX, TROPONINI in the last 72 hours. BNP: Invalid input(s): POCBNP D-Dimer: No results for input(s): DDIMER in the last 72 hours. Hemoglobin A1C: No results for input(s): HGBA1C in the last 72 hours. Fasting Lipid Panel: No results for input(s): CHOL, HDL, LDLCALC,  TRIG, CHOLHDL, LDLDIRECT in the last 72 hours. Thyroid Function Tests: No results for input(s): TSH, T4TOTAL, T3FREE, THYROIDAB in the last 72 hours.  Invalid input(s): FREET3 Anemia Panel: No results for input(s): VITAMINB12, FOLATE, FERRITIN, TIBC, IRON, RETICCTPCT in the last 72 hours. Coag Panel:   Lab Results  Component Value Date   INR 2.13* 08/21/2014   INR 2.45* 08/20/2014   INR 2.66* 08/19/2014    RADIOLOGY: Ct Abdomen Pelvis Wo Contrast  08/13/2014   CLINICAL DATA:  Bleeding.  No other indication provided.  EXAM: CT ABDOMEN AND PELVIS WITHOUT CONTRAST  TECHNIQUE: Multidetector CT imaging of the abdomen and pelvis was performed following the standard protocol without IV contrast.  COMPARISON:  08/10/2011  FINDINGS: Mild dependent changes in the lung bases. Diffuse cardiac enlargement. Postoperative changes in the mediastinum. Calcification in the mitral valve annulus.  Evaluation of solid organs and vascular structures is limited without IV contrast material. Circumscribed low-attenuation lesion in segment 4 of the liver measuring 2.3 x 3.6 cm. This is unchanged since prior study and probably represents a cyst. Appearance of the gallbladder is nonspecific. There is is suggestion of mild gallbladder  wall thickening and infiltration. Single tiny stone identified. Early changes of cholecystitis not excluded. Unenhanced appearance of the pancreas, spleen, kidneys, adrenal glands, and inferior vena cava are unremarkable. Calcification of the abdominal aorta without aneurysm. Low-attenuation lymph node between the aorta and IVC measures 12 mm in is likely reactive. Stomach, small bowel, and colon are decompressed. Scattered diverticula in the colon. No free air or free fluid in the abdomen. No abnormal retroperitoneal fluid collections.  Pelvis: Diverticulosis of the sigmoid colon without inflammatory change. Uterus appears surgically absent. No pelvic mass or lymphadenopathy. No free or loculated  pelvic fluid collections. Appendix is not identified. Bladder wall is not thickened. Small left inguinal hernia containing fat. Degenerative changes in the spine and hips.  IMPRESSION: Possible inflammatory changes around the gallbladder. Correlate clinically for suspicion of cholecystitis. Cyst in the liver. No acute process otherwise identified.   Electronically Signed   By: Lucienne Capers M.D.   On: 08/13/2014 20:24   Dg Lumbar Spine Complete  08/18/2014   CLINICAL DATA:  Right greater than left hip pain. Back pain for 5 days. Patient fell tissue is scanning from the toilet. Slipped and fell on the floor at home.  EXAM: LUMBAR SPINE - COMPLETE 4+ VIEW  COMPARISON:  CT of the abdomen and pelvis 08/13/2014  FINDINGS: There is normal alignment of lumbar spine. Degenerative changes are mild. There is dense calcification of the abdominal aorta.  IMPRESSION: No evidence for acute  abnormality.   Electronically Signed   By: Nolon Nations M.D.   On: 08/18/2014 16:36   Ct Head Wo Contrast  08/13/2014   CLINICAL DATA:  Rectal bleeding. Anti coagulation. Bruising along the back of the neck.  EXAM: CT HEAD WITHOUT CONTRAST  CT CERVICAL SPINE WITHOUT CONTRAST  TECHNIQUE: Multidetector CT imaging of the head and cervical spine was performed following the standard protocol without intravenous contrast. Multiplanar CT image reconstructions of the cervical spine were also generated.  COMPARISON:  10/14/2009  FINDINGS: CT HEAD FINDINGS  The brainstem, cerebellum, cerebral peduncles, thalamus, basal ganglia, basilar cisterns, and ventricular system appear within normal limits. Periventricular white matter and corona radiata hypodensities favor chronic ischemic microvascular white matter disease. No intracranial hemorrhage, mass lesion, or acute CVA.  Mild chronic right sphenoid sinusitis. Right mastoid effusion. There is atherosclerotic calcification of the cavernous carotid arteries bilaterally.  CT CERVICAL SPINE  FINDINGS  No cervical spine fracture or significant abnormal subluxation. No prevertebral soft tissue swelling or significant bony lesion observed. Clips are present in the left neck. No cervical spine  Clips are present in the left neck I do not observe a significant hematoma in the visualized portion of the neck, although please note that the entirety of the neck is not included. The spinous processes appear unremarkable.  IMPRESSION: 1. No acute intracranial findings or acute cervical spine findings. 2. Periventricular white matter and corona radiata hypodensities favor chronic ischemic microvascular white matter disease. 3. Right mastoid effusion with mild chronic right sphenoid sinusitis. 4. No significant hematoma is seen in the visualized portion of the the neck.   Electronically Signed   By: Van Clines M.D.   On: 08/13/2014 20:22   Ct Cervical Spine Wo Contrast  08/13/2014   CLINICAL DATA:  Rectal bleeding. Anti coagulation. Bruising along the back of the neck.  EXAM: CT HEAD WITHOUT CONTRAST  CT CERVICAL SPINE WITHOUT CONTRAST  TECHNIQUE: Multidetector CT imaging of the head and cervical spine was performed following the standard protocol without intravenous  contrast. Multiplanar CT image reconstructions of the cervical spine were also generated.  COMPARISON:  10/14/2009  FINDINGS: CT HEAD FINDINGS  The brainstem, cerebellum, cerebral peduncles, thalamus, basal ganglia, basilar cisterns, and ventricular system appear within normal limits. Periventricular white matter and corona radiata hypodensities favor chronic ischemic microvascular white matter disease. No intracranial hemorrhage, mass lesion, or acute CVA.  Mild chronic right sphenoid sinusitis. Right mastoid effusion. There is atherosclerotic calcification of the cavernous carotid arteries bilaterally.  CT CERVICAL SPINE FINDINGS  No cervical spine fracture or significant abnormal subluxation. No prevertebral soft tissue swelling or  significant bony lesion observed. Clips are present in the left neck. No cervical spine  Clips are present in the left neck I do not observe a significant hematoma in the visualized portion of the neck, although please note that the entirety of the neck is not included. The spinous processes appear unremarkable.  IMPRESSION: 1. No acute intracranial findings or acute cervical spine findings. 2. Periventricular white matter and corona radiata hypodensities favor chronic ischemic microvascular white matter disease. 3. Right mastoid effusion with mild chronic right sphenoid sinusitis. 4. No significant hematoma is seen in the visualized portion of the the neck.   Electronically Signed   By: Van Clines M.D.   On: 08/13/2014 20:22   Nm Hepatobiliary Including Gb  08/14/2014   CLINICAL DATA:  Abdominal pain.  Nausea.  Pancreatitis.  EXAM: NUCLEAR MEDICINE HEPATOBILIARY IMAGING  TECHNIQUE: Sequential images of the abdomen were obtained out to 60 minutes following intravenous administration of radiopharmaceutical.  RADIOPHARMACEUTICALS:  5.0 Millicurie QP-61P Choletec  COMPARISON:  None.  FINDINGS: Prompt radiopharmaceutical uptake by the liver is seen. Liver is normal in appearance.  Prompt biliary excretion of activity is seen. Gallbladder activity is seen initially on the 20 minutes image. Biliary activity reaches the small bowel initially on the 25 min image.  IMPRESSION: Normal hepatobiliary scan. No evidence of cystic duct or biliary obstruction   Electronically Signed   By: Earle Gell M.D.   On: 08/14/2014 14:22   US Abdomen Complete  08/20/2014   CLINICAL DATA:  Subsequent encounter for abnormal gallbladder.  EXAM: ULTRASOUND ABDOMEN COMPLETE  COMPARISON:  Ultrasound exam 08/13/2014.  CT scan from 08/13/2014.  FINDINGS: Gallbladder: Gallbladder is moderately distended. Tiny dependent stones measure about 5 mm. There is no gallbladder wall thickening. No pericholecystic fluid. The sonographer reports  no sonographic Murphy sign.  Common bile duct: Diameter: 3 mm  Liver: Cystic areas are evident within the liver parenchyma. These were seen on the recent CT scan and are stable comparing back to a CT scan from 08/10/2011. Imaging features are most consistent with cysts.  IVC: No abnormality visualized.  Pancreas: Visualized portion unremarkable.  Spleen: Size and appearance within normal limits.  Right Kidney: Length: 9.8 cm. Echogenicity within normal limits. No mass or hydronephrosis visualized.  Left Kidney: Length: 10.7 cm. Echogenicity within normal limits. No mass or hydronephrosis visualized.  Abdominal aorta: Not well seen secondary to bowel gas.  Other findings: None.  IMPRESSION: Probable tiny gallstone with distended gallbladder. No pericholecystic fluid or gallbladder wall thickening. The sonographer reports no sonographic Murphy sign.   Electronically Signed   By: Misty Stanley M.D.   On: 08/20/2014 15:54   Dg Chest Port 1 View  08/19/2014   CLINICAL DATA:  81 year old female with leukocytosis.  EXAM: PORTABLE CHEST - 1 VIEW  COMPARISON:  Prior chest x-ray 08/13/2014  FINDINGS: Stable cardiomegaly. Atherosclerotic and slightly tortuous thoracic aorta. Stable position  of left subclavian approach cardiac rhythm maintenance device with leads projecting over the right atrium and right ventricle. Slightly increased pulmonary vascular congestion and interstitial prominence with fluid tracking along the minor fissure suggesting mild interstitial edema. Unchanged linear atelectasis versus chronic pleural parenchymal scarring in the left base. No focal airspace consolidation, pleural effusion or pneumothorax. No acute osseous abnormality.  IMPRESSION: 1. Mild interstitial edema. In the setting of cardiomegaly this suggests early CHF. 2. Stable left basilar scarring versus chronic atelectasis. 3. No focal airspace consolidation to suggest pneumonia.   Electronically Signed   By: Jacqulynn Cadet M.D.   On:  08/19/2014 12:01   Dg Chest Port 1 View  08/13/2014   CLINICAL DATA:  Rectal bleeding. Dark colored urine for 3 days. Fall 10 days ago. Back pain.  EXAM: PORTABLE CHEST - 1 VIEW  COMPARISON:  06/25/2012  FINDINGS: Moderate enlargement of the cardiopericardial silhouette observed with indistinct pulmonary vasculature, cephalization of blood flow, and bilateral interstitial accentuation in the lungs. Dual lead pacer remains in place. Prior median sternotomy.  Atherosclerotic calcification of the aortic arch. Bandlike opacities in the retrocardiac region likely reflecting left lower lobe atelectasis. Prior blunting of the costophrenic angles appears improved although this may be positional. The overall degree of interstitial edema appears mildly increased.  IMPRESSION: 1. Moderate enlargement of the cardiopericardial silhouette with interstitial edema. Reduced conspicuity of prior pleural effusions. 2. Suspected bandlike atelectasis at the left lung base. 3. Dual lead pacer remains in place.   Electronically Signed   By: Van Clines M.D.   On: 08/13/2014 20:33   Dg Hips Bilat With Pelvis 3-4 Views  08/18/2014   CLINICAL DATA:  Right greater than left hip pain. Back pain for 5 days. Status post fall while getting out from the toilet. Patient slipped and fell on the floor at her home.  EXAM: BILATERAL HIP (WITH PELVIS) 3-4 VIEWS  COMPARISON:  None.  FINDINGS: There is no evidence of fracture or other focal bone lesions. Soft tissues are unremarkable.  IMPRESSION: Negative.   Electronically Signed   By: Nolon Nations M.D.   On: 08/18/2014 16:53   US Abdomen Limited Ruq  08/13/2014   CLINICAL DATA:  Abdominal pain. Recent CT with questionable inflammatory change about the gallbladder.  EXAM: US ABDOMEN LIMITED - RIGHT UPPER QUADRANT  COMPARISON:  CT abdomen/ pelvis earlier this day.  FINDINGS: Gallbladder:  Elongated spanning 11.2 cm. Contains dependent sludge and small echogenic stones. Normal  gallbladder wall thickness of 2 mm. No pericholecystic fluid. No sonographic Murphy sign noted.  Common bile duct:  Diameter: 5 mm.  Liver:  Two hepatic cysts again seen, largest measures 3.5 x 2.1 x 2.9 cm. Otherwise within normal limits in parenchymal echogenicity.  IMPRESSION: 1. Distended gallbladder containing dependent sludge and small stones. There are no secondary sonographic findings of acute cholecystitis. Wall thickness is normal, there is a negative sonographic Murphy sign. No biliary dilatation. 2. Hepatic cysts.   Electronically Signed   By: Jeb Levering M.D.   On: 08/13/2014 22:49      ASSESSMENT: Kathyrn Lass:   1) Acute on chronic diastolic heart failure: ARF with Lasixon 2/28. Agree with holding diuretics for now.  Consider restarting only when renal function normalizes.  Low albumin may explain 3rd spacing and renal failure with diuretics.  Encourage PO intake.  Awaiting today's electrolytes.  2) Mild to moderate MR by recent echo with EF 60-65%.  3) AFib: Warfarin for stroke prevention.  Currently therapeutic.  Jettie Booze, MD  08/21/2014  7:18 AM

## 2014-08-21 NOTE — Progress Notes (Addendum)
PROGRESS NOTE  Carla Black DJM:[id number] DOB: [birth year] DOA: 08/13/2014 PCP: Leandrew Koyanagi, MD  HPI: Carla Black is a 81 y.o. female with Past medical history of hypertension, dyslipidemia, HOCM, chronic combined CHF, chronic hypoxic respiratory failure, pacemaker implant, GERD, A. fib, peripheral vascular disease. The patient presented with complaints of abdominal pain and a fall, found to have acute pancreatitis, cholelithiasis, worsening of her CHF.  Interim summary - Patient was admitted on 08/13/2014 with acute pancreatitis. She has a history of heart failure and she was fluid overloaded on admission with crackles in lower extremity edema. Initial right upper quadrant ultrasound showed a distended gallbladder with sludge and small stones. Gastroenterology, cardiology as well as  general surgery were consulted. Patient was managed conservatively with nothing by mouth, gentle hydration with close monitoring of her respiratory status. As her lipase and abdominal symptoms have improved, her IV fluids were discontinued and she was started on IV diuresis per cardiology. Her diet was slowly advanced to clear liquids, full liquids and eventually tolerating a soft diet. Over the last 3 days, patient's abdominal symptoms have returned and she has been having increased pain, she has developed leukocytosis as well as increase in her liver function tests. She was started empirically on Zosyn on 2/28. She is deemed a very poor surgical candidate or candidate for any other invasive interventions such as ERCP. On presentation she was in renal failure, which has improved to a point while undergoing diuresis, however she has been having progressive worsening of her renal function in the last couple days. I discussed extensively with the patient and the patient's family regarding current clinical picture, she is not a good candidate for invasive procedures, and not having a complete response to  conservative management. Patient herself is quite tired of being in the hospital and wants [place] to stop everything and let her go home. I discussed CODE STATUS and per their wishes she will be DO NOT RESUSCITATE. I have also consulted palliative care.    Subjective / 24 H Interval events - worsening abdominal pain today  Assessment/Plan: Principal Problem:   Pancreatitis, acute Active Problems:   Abdominal pain   AKI (acute kidney injury)   Elevated LFTs   Acute on chronic combined systolic and diastolic heart failure   Supratherapeutic INR   BRBPR (bright red blood per rectum)   Abnormal gall bladder diagnostic imaging   Acute pancreatitis   Acute kidney injury   Leukocytosis   Atrial fibrillation   Acute on chronic diastolic heart failure  Leukocytosis  - increase in white count on 2/28 And persistent - CXR without pneumonia, UA not impressive for UTI - increased WBC today, with increase LFTs/Bilirubin, started Zosyn - Patient without significant symptoms such as dysuria or cough/chest congestion However she has worsening over the abdominal pain   Addendum: patient lost IV access and refusing further IVs, switched her antibiotics from Zosyn to Cipro, meanwhile awaiting [place] discussions with palliative.   Acute pancreatitis / abdominal pain / poor po intake  - possibly related to cholelithiasis - general surgery followed, signed off. GI consulted, appreciate input - gently hydrated, improving, underwent diuresis per cardiology, Lasix discontinued on 2/29 and given worsening of her renal function   HOCM / Mitral regurgitation / acute on chronic diastolic heart failure - repeated 2D echo with severe LVH, EF 60-65%, aortic stenosis/regurgitation, mitral valve regurgitation - hydrated gently for #1 - appreciate cardiology assistance  Back/hip pain - patient with increased pain in  hips/back - X-ray without acute findings  A fib  - now pacemaker dependent - coumadin per  pharmacy  Supratherapeutic INR  - resolved  Bright red blood per rectum - no longer reported  Acute on chronic renal failure - CKD stage III - likely due to dehydration, improved with fluids however has been worsening with diuresis   Elevated LFTs with elevated bilirubin  - initial improvement, however now worsening  - continue Zosyn  Diet: Diet Heart Fluids: none  DVT Prophylaxis: Coumadin  Code Status: DNR Family Communication: d/w patient and husband Disposition Plan: inpatient  Consultants:  General Surgery - s/o  GI  Cardiology   Procedures:  None    Antibiotics  Anti-infectives    Start     Dose/Rate Route Frequency Ordered Stop   08/20/14 1000  piperacillin-tazobactam (ZOSYN) IVPB 3.375 g     3.375 g 12.5 mL/hr over 240 Minutes Intravenous Every 8 hours 08/20/14 0920         Studies  US Abdomen Complete  08/20/2014   CLINICAL DATA:  Subsequent encounter for abnormal gallbladder.  EXAM: ULTRASOUND ABDOMEN COMPLETE  COMPARISON:  Ultrasound exam 08/13/2014.  CT scan from 08/13/2014.  FINDINGS: Gallbladder: Gallbladder is moderately distended. Tiny dependent stones measure about 5 mm. There is no gallbladder wall thickening. No pericholecystic fluid. The sonographer reports no sonographic Murphy sign.  Common bile duct: Diameter: 3 mm  Liver: Cystic areas are evident within the liver parenchyma. These were seen on the recent CT scan and are stable comparing back to a CT scan from 08/10/2011. Imaging features are most consistent with cysts.  IVC: No abnormality visualized.  Pancreas: Visualized portion unremarkable.  Spleen: Size and appearance within normal limits.  Right Kidney: Length: 9.8 cm. Echogenicity within normal limits. No mass or hydronephrosis visualized.  Left Kidney: Length: 10.7 cm. Echogenicity within normal limits. No mass or hydronephrosis visualized.  Abdominal aorta: Not well seen secondary to bowel gas.  Other findings: None.  IMPRESSION:  Probable tiny gallstone with distended gallbladder. No pericholecystic fluid or gallbladder wall thickening. The sonographer reports no sonographic Murphy sign.   Electronically Signed   By: Misty Stanley M.D.   On: 08/20/2014 15:54    Objective  Filed Vitals:   08/20/14 2121 08/21/14 0511 08/21/14 1113 08/21/14 1414  BP: 120/48 135/83 144/52 118/61  Pulse: 59 59 60 60  Temp: 97.6 F (36.4 C) 98.6 F (37 C)  97.9 F (36.6 C)  TempSrc: Oral Oral  Oral  Resp: 18 18    Height:      Weight:  84.5 kg (186 lb 4.6 oz)    SpO2: 98% 97%  96%    Intake/Output Summary (Last 24 hours) at 08/21/14 1446 Last data filed at 08/21/14 0600  Gross per 24 hour  Intake    460 ml  Output      0 ml  Net    460 ml   Filed Weights   08/19/14 0628 08/20/14 0644 08/21/14 0511  Weight: 84.1 kg (185 lb 6.5 oz) 83.5 kg (184 lb 1.4 oz) 84.5 kg (186 lb 4.6 oz)    Exam:  General:  NAD  HEENT: no scleral icterus  Cardiovascular: RRR  Respiratory: bibasilar crackles, no wheezing  Abdomen: soft, tender in the epigastric area, BS +  MSK/Extremities: 2+ pitting LE edema  Skin: no rashes  Neuro: non focal   Data Reviewed: Basic Metabolic Panel:  Recent Labs Lab 08/17/14 0515 08/18/14 0550 08/19/14 0542 08/20/14 0500 08/21/14 0540  NA 138 139 136 136 134*  K 4.2 4.4 4.1 4.1 4.0  CL 101 99 99 98 96  CO2 30 32 32 30 28  GLUCOSE 96 101* 106* 106* 111*  BUN 29* 25* 31* 37* 44*  CREATININE 1.38* 1.20* 1.53* 1.98* 2.07*  CALCIUM 8.4 8.5 8.1* 7.8* 7.6*   Liver Function Tests:  Recent Labs Lab 08/15/14 0540 08/16/14 0500 08/17/14 0515 08/20/14 0500 08/21/14 0540  AST 87* 66* 67* 147* 320*  ALT 137* 106* 99* 113* 194*  ALKPHOS 180* 147* 146* 124* 127*  BILITOT 2.3* 1.6* 1.6* 2.0* 2.0*  PROT 6.9 6.3 6.5 5.7* 5.6*  ALBUMIN 2.8* 2.6* 2.7* 2.2* 2.3*    Recent Labs Lab 08/15/14 0540 08/17/14 0515  LIPASE 145* 65*   CBC:  Recent Labs Lab 08/15/14 0540 08/16/14 0500  08/19/14 0540 08/20/14 0500 08/21/14 0540  WBC 7.1 6.8 14.7* 15.7* 16.3*  HGB 13.4 12.2 11.9* 10.9* 11.6*  HCT 42.6 39.4 38.9 34.3* 36.5  MCV 97.9 98.0 98.2 96.9 95.5  PLT 231 254 256 242 235   Cardiac Enzymes: No results for input(s): CKTOTAL, CKMB, CKMBINDEX, TROPONINI in the last 168 hours. BNP (last 3 results)  Recent Labs  08/13/14 1733 08/21/14 0540  BNP 1326.6* 878.4*   Scheduled Meds: . amiodarone  200 mg Oral Daily  . antiseptic oral rinse  7 mL Mouth Rinse q12n4p  . aspirin EC  81 mg Oral Daily  . chlorhexidine  15 mL Mouth Rinse BID  . diltiazem  180 mg Oral QPM  . ferrous sulfate  325 mg Oral Q breakfast  . fluticasone  2 spray Each Nare Daily  . folic acid  1 mg Oral Daily  . hydrocortisone  25 mg Rectal BID  . levalbuterol  0.315 mg Nebulization TID  . levothyroxine  100 mcg Oral QAC breakfast  . loratadine  10 mg Oral Daily  . meclizine  25 mg Oral QHS  . metoprolol  150 mg Oral BID  . piperacillin-tazobactam (ZOSYN)  IV  3.375 g Intravenous Q8H  . polyethylene glycol  17 g Oral Daily  . sodium chloride  3 mL Intravenous Q12H   Continuous Infusions:    Time spent: 35 minutes, more than 50% in goals of care / family discussions  Marzetta Board, MD [facility] Pager [phone]. If 7 PM - 7 AM, please contact night-coverage at [email or web address], password [facility] 08/21/2014, 2:46 PM  LOS: 8 days

## 2014-08-22 DIAGNOSIS — Z515 Encounter for palliative care: Secondary | ICD-10-CM | POA: Insufficient documentation

## 2014-08-22 DIAGNOSIS — F411 Generalized anxiety disorder: Secondary | ICD-10-CM | POA: Insufficient documentation

## 2014-08-22 DIAGNOSIS — R109 Unspecified abdominal pain: Secondary | ICD-10-CM

## 2014-08-22 LAB — HEPATIC FUNCTION PANEL
ALT: 314 U/L — ABNORMAL HIGH (ref 0–35)
AST: 564 U/L — AB (ref 0–37)
Albumin: 2.1 g/dL — ABNORMAL LOW (ref 3.5–5.2)
Alkaline Phosphatase: 115 U/L (ref 39–117)
BILIRUBIN DIRECT: 1 mg/dL — AB (ref 0.0–0.5)
Indirect Bilirubin: 1.6 mg/dL — ABNORMAL HIGH (ref 0.3–0.9)
TOTAL PROTEIN: 5.3 g/dL — AB (ref 6.0–8.3)
Total Bilirubin: 2.6 mg/dL — ABNORMAL HIGH (ref 0.3–1.2)

## 2014-08-22 LAB — BASIC METABOLIC PANEL
ANION GAP: 8 (ref 5–15)
BUN: 49 mg/dL — ABNORMAL HIGH (ref 6–23)
CHLORIDE: 98 mmol/L (ref 96–112)
CO2: 28 mmol/L (ref 19–32)
Calcium: 7.6 mg/dL — ABNORMAL LOW (ref 8.4–10.5)
Creatinine, Ser: 2.04 mg/dL — ABNORMAL HIGH (ref 0.50–1.10)
GFR calc Af Amer: 25 mL/min — ABNORMAL LOW (ref >90)
GFR calc non Af Amer: 21 mL/min — ABNORMAL LOW (ref >90)
GLUCOSE: 85 mg/dL (ref 70–99)
Potassium: 4.7 mmol/L (ref 3.5–5.1)
SODIUM: 134 mmol/L — AB (ref 135–145)

## 2014-08-22 LAB — CBC
HEMATOCRIT: 35.5 % — AB (ref 36.0–46.0)
Hemoglobin: 11.5 g/dL — ABNORMAL LOW (ref 12.0–15.0)
MCH: 30.5 pg (ref 26.0–34.0)
MCHC: 32.4 g/dL (ref 30.0–36.0)
MCV: 94.2 fL (ref 78.0–100.0)
Platelets: 220 10*3/uL (ref 150–400)
RBC: 3.77 MIL/uL — ABNORMAL LOW (ref 3.87–5.11)
RDW: 15.2 % (ref 11.5–15.5)
WBC: 16.4 10*3/uL — AB (ref 4.0–10.5)

## 2014-08-22 LAB — LIPASE, BLOOD: Lipase: 35 U/L (ref 11–59)

## 2014-08-22 MED ORDER — HYDROMORPHONE HCL 1 MG/ML IJ SOLN
0.5000 mg | Freq: Once | INTRAMUSCULAR | Status: AC
  Administered 2014-08-22: 0.5 mg via INTRAVENOUS
  Filled 2014-08-22: qty 1

## 2014-08-22 MED ORDER — OXYCODONE HCL 5 MG PO TABS: 5.0000 mg | ORAL_TABLET | Freq: Four times a day (QID) | ORAL | Status: DC

## 2014-08-22 MED ORDER — HYDROMORPHONE HCL 1 MG/ML IJ SOLN
0.2000 mg | INTRAMUSCULAR | Status: DC | PRN
  Administered 2014-08-22: 0.4 mg via INTRAVENOUS
  Filled 2014-08-22: qty 1

## 2014-08-22 MED ORDER — HYDROCORTISONE ACETATE 25 MG RE SUPP: 25.0000 mg | Freq: Two times a day (BID) | RECTAL | Status: AC

## 2014-08-22 MED ORDER — MORPHINE SULFATE (CONCENTRATE) 10 MG/0.5ML PO SOLN: 5.0000 mg | ORAL | Status: AC | PRN

## 2014-08-22 MED ORDER — OXYCODONE HCL 5 MG PO TABS: 5.0000 mg | ORAL_TABLET | ORAL | Status: AC | PRN

## 2014-08-22 MED ORDER — LORAZEPAM 1 MG PO TABS: 1.0000 mg | ORAL_TABLET | Freq: Four times a day (QID) | ORAL | Status: AC | PRN

## 2014-08-22 MED ORDER — LORAZEPAM 2 MG/ML IJ SOLN
0.5000 mg | INTRAMUSCULAR | Status: DC | PRN
Start: 2014-08-22 — End: 2014-08-22
  Administered 2014-08-22: 0.5 mg via INTRAVENOUS
  Filled 2014-08-22: qty 1

## 2014-08-22 NOTE — Progress Notes (Signed)
On assessment of patient, patient stated that she did not want to take any of her medications. She stated that he family said she did not have to and she was ready to meet her jesus. I asked her if I could re-check her BP this morning and she said she did not want me to. Paged MD with patient wishes. Patient was ok with mouth care and remaining comfortable.

## 2014-08-22 NOTE — Progress Notes (Signed)
CSW received consult from Dr. Deitra Mayo for residential hospice placement. CSW spoke with patient's daughter, Carla Black in hallway who states that [facility] or [facility] would be convenient for them as patient's husband, Carla Black is still living in [place] at [facility]. CSW checked with Eva, [place] [facility] who states that they have no availability. CSW spoke with Beverlee Nims at [facility] who is on her way to [place] will speak with family.    Raynaldo Opitz, [facility] Clinical Social Worker cell #: [phone]

## 2014-08-22 NOTE — Discharge Instructions (Signed)
Abdominal Pain °Many things can cause abdominal pain. Usually, abdominal pain is not caused by a disease and will improve without treatment. It can often be observed and treated at home. Your health care provider will do a physical exam and possibly order blood tests and X-rays to help determine the seriousness of your pain. However, in many cases, more time must pass before a clear cause of the pain can be found. Before that point, your health care provider may not know if you need more testing or further treatment. °HOME CARE INSTRUCTIONS  °Monitor your abdominal pain for any changes. The following actions may help to alleviate any discomfort you are experiencing: °· Only take over-the-counter or prescription medicines as directed by your health care provider. °· Do not take laxatives unless directed to do so by your health care provider. °· Try a clear liquid diet (broth, tea, or water) as directed by your health care provider. Slowly move to a bland diet as tolerated. °SEEK MEDICAL CARE IF: °· You have unexplained abdominal pain. °· You have abdominal pain associated with nausea or diarrhea. °· You have pain when you urinate or have a bowel movement. °· You experience abdominal pain that wakes you in the night. °· You have abdominal pain that is worsened or improved by eating food. °· You have abdominal pain that is worsened with eating fatty foods. °· You have a fever. °SEEK IMMEDIATE MEDICAL CARE IF:  °· Your pain does not go away within 2 hours. °· You keep throwing up (vomiting). °· Your pain is felt only in portions of the abdomen, such as the right side or the left lower portion of the abdomen. °· You pass bloody or black tarry stools. °MAKE SURE YOU: °· Understand these instructions.   °· Will watch your condition.   °· Will get help right away if you are not doing well or get worse.   °Document Released: 03/18/2005 Document Revised: 06/13/2013 Document Reviewed: 02/15/2013 °[employer]® Patient Information  ©2015 [employer]. This information is not intended to replace advice given to you by your health care provider. Make sure you discuss any questions you have with your health care provider. ° ° ° °Acute Pancreatitis °Acute pancreatitis is a disease in which the pancreas becomes suddenly inflamed. The pancreas is a large gland located behind your stomach. The pancreas produces enzymes that help digest food. The pancreas also releases the hormones glucagon and insulin that help regulate blood sugar. Damage to the pancreas occurs when the digestive enzymes from the pancreas are activated and begin attacking the pancreas before being released into the intestine. Most acute attacks last a couple of days and can cause serious complications. Some people become dehydrated and develop low blood pressure. In severe cases, bleeding into the pancreas can lead to shock and can be life-threatening. The lungs, heart, and kidneys may fail. °CAUSES  °Pancreatitis can happen to anyone. In some cases, the cause is unknown. Most cases are caused by: °· Alcohol abuse. °· Gallstones. °Other less common causes are: °· Certain medicines. °· Exposure to certain chemicals. °· Infection. °· Damage caused by an accident (trauma). °· Abdominal surgery. °SYMPTOMS  °· Pain in the upper abdomen that may radiate to the back. °· Tenderness and swelling of the abdomen. °· Nausea and vomiting. °DIAGNOSIS  °Your caregiver will perform a physical exam. Blood and stool tests may be done to confirm the diagnosis. Imaging tests may also be done, such as X-rays, CT scans, or an ultrasound of the abdomen. °TREATMENT  °  Treatment usually requires a stay in the hospital. Treatment may include: °· Pain medicine. °· Fluid replacement through an intravenous line (IV). °· Placing a tube in the stomach to remove stomach contents and control vomiting. °· Not eating for 3 or 4 days. This gives your pancreas a rest, because enzymes are not being produced that can  cause further damage. °· Antibiotic medicines if your condition is caused by an infection. °· Surgery of the pancreas or gallbladder. °HOME CARE INSTRUCTIONS  °· Follow the diet advised by your caregiver. This may involve avoiding alcohol and decreasing the amount of fat in your diet. °· Eat smaller, more frequent meals. This reduces the amount of digestive juices the pancreas produces. °· Drink enough fluids to keep your urine clear or pale yellow. °· Only take over-the-counter or prescription medicines as directed by your caregiver. °· Avoid drinking alcohol if it caused your condition. °· Do not smoke. °· Get plenty of rest. °· Check your blood sugar at home as directed by your caregiver. °· Keep all follow-up appointments as directed by your caregiver. °SEEK MEDICAL CARE IF:  °· You do not recover as quickly as expected. °· You develop new or worsening symptoms. °· You have persistent pain, weakness, or nausea. °· You recover and then have another episode of pain. °SEEK IMMEDIATE MEDICAL CARE IF:  °· You are unable to eat or keep fluids down. °· Your pain becomes severe. °· You have a fever or persistent symptoms for more than 2 to 3 days. °· You have a fever and your symptoms suddenly get worse. °· Your skin or the white part of your eyes turn yellow (jaundice). °· You develop vomiting. °· You feel dizzy, or you faint. °· Your blood sugar is high (over 300 mg/dL). °MAKE SURE YOU:  °· Understand these instructions. °· Will watch your condition. °· Will get help right away if you are not doing well or get worse. °Document Released: 06/08/2005 Document Revised: 12/08/2011 Document Reviewed: 09/17/2011 °[employer]® Patient Information ©2015 [employer]. This information is not intended to replace advice given to you by your health care provider. Make sure you discuss any questions you have with your health care provider. ° °

## 2014-08-22 NOTE — Discharge Summary (Addendum)
Physician Discharge Summary  Carla Black LZJ:[id number] DOB: 12-04-1927 DOA: 08/13/2014  PCP: Leandrew Koyanagi, MD  Admit date: 08/13/2014 Discharge date: 08/22/2014  Recommendations for Outpatient Follow-up:  Please note that patient asked for full comfort, opted not to have any further medications such as anticoagulation or antibiotics Patient agreeable to take anxiety medication or pain medicine as needed Family and patient in agreement with discharge to residential hospice once bed available Received information that bed is available, order for d/c placed at 2:20 pm   Discharge Diagnoses:  Principal Problem:   Pancreatitis, acute Active Problems:   Abdominal pain   AKI (acute kidney injury)   Elevated LFTs   Acute on chronic combined systolic and diastolic heart failure   Supratherapeutic INR   BRBPR (bright red blood per rectum)   Abnormal gall bladder diagnostic imaging   Acute pancreatitis   Acute kidney injury   Leukocytosis   Atrial fibrillation   Acute on chronic diastolic heart failure   Goals of care, counseling/discussion   Anxiety state   Palliative care encounter   Discharge Condition: Stable  Diet recommendation: Comfort feeding as patient able to tolerate  History of present illness:  81 y.o. female with hypertension, dyslipidemia, HOCM, chronic combined CHF, chronic hypoxic respiratory failure, pacemaker implant, GERD, A. fib, peripheral vascular disease. The patient presented with complaints of abdominal pain and a fall, found to have acute pancreatitis, cholelithiasis, worsening of her CHF.  Interim summary   Patient was admitted on 08/13/2014 with acute pancreatitis. She has a history of heart failure and she was fluid overloaded on admission with crackles and lower extremity edema. Initial right upper quadrant ultrasound showed a distended gallbladder with sludge and small stones. Gastroenterology, cardiology as well as general surgery were  consulted. Patient was managed conservatively with nothing by mouth, gentle hydration with close monitoring of her respiratory status. As her lipase and abdominal symptoms have improved, her IV fluids were discontinued and she was started on IV diuresis per cardiology. Her diet was slowly advanced to clear liquids, full liquids and eventually tolerating a soft diet. Over the last 3 days prior to discharge, patient's abdominal symptoms have returned and she has been having increased pain, she has developed leukocytosis as well as increase in her liver function tests. She was started empirically on Zosyn on 2/28. She was deemed a very poor surgical candidate or candidate for any other invasive interventions such as ERCP. On presentation she was in renal failure, which has improved to a point while undergoing diuresis, however she has been having progressive worsening of her renal function in the last couple days. Goals of care discussed with family and patient, patient opting for comfort care, does not want any further interventions, antibiotics, no anticoagulation. She was to be discharged to residential hospice and family in agreement.   Assessment/Plan: Leukocytosis  - increase in white count on 2/28 And persistent - CXR without pneumonia, UA not impressive for UTI - increased WBC today, with increase LFTs/Bilirubin, started Zosyn 3/1 but the patient has refused  - Patient does not want any further antibiotics and wants to focus on comfort  Acute pancreatitis / abdominal pain / poor po intake  - possibly related to cholelithiasis - general surgery followed, signed off. GI consulted, appreciate input - Lasix was discontinued due to worsening renal function, cardiology does not recommend continuing Lasix and patient in agreement  HOCM / Mitral regurgitation / acute on chronic diastolic heart failure - repeated 2D echo  with severe LVH, EF 60-65%, aortic stenosis/regurgitation, mitral valve  regurgitation - appreciate cardiology assistance, further treatment limited as pt wants to be comfortable - Focus on comfort care  Back/hip pain - patient with increased pain in hips/back - X-ray without acute findings - Allow pain medicines as needed  A fib  - now pacemaker dependent - Patient explained she does not want any Coumadin, she does not want to have vital signs checked - We will allow comfort care per patient wishes - pt Ok with taking Cardizem   Supratherapeutic INR  - resolved  Bright red blood per rectum  - no longer reported - Hg stable   Acute on chronic renal failure - CKD stage III - likely due to dehydration, improved with fluids however has been worsening with diuresis  - No further blood tests desired per patient  Elevated LFTs with elevated bilirubin  - initial improvement, however now worsening  - Off antibiotics at this time per patient wish  Severe protein calorie malnutrition - In the context of acute on chronic illness, outlined above and poor clinical response to medical interventions outlined above - Progressive failure to thrive - Allow advancing diet and comfort feeding as patient able to tolerate   Diet: Comfort feeding as patient able to tolerate  Code Status: DNR Family Communication: d/w patient and husband, rest of the family at bedside Disposition Plan: Residential hospice when bed available  Consultants:  General Surgery - s/o  GI  Cardiology  Procedures/Studies: Ct Abdomen Pelvis Wo Contrast  08/13/2014   CLINICAL DATA:  Bleeding.  No other indication provided.  EXAM: CT ABDOMEN AND PELVIS WITHOUT CONTRAST  TECHNIQUE: Multidetector CT imaging of the abdomen and pelvis was performed following the standard protocol without IV contrast.  COMPARISON:  08/10/2011  FINDINGS: Mild dependent changes in the lung bases. Diffuse cardiac enlargement. Postoperative changes in the mediastinum. Calcification in the mitral valve annulus.   Evaluation of solid organs and vascular structures is limited without IV contrast material. Circumscribed low-attenuation lesion in segment 4 of the liver measuring 2.3 x 3.6 cm. This is unchanged since prior study and probably represents a cyst. Appearance of the gallbladder is nonspecific. There is is suggestion of mild gallbladder wall thickening and infiltration. Single tiny stone identified. Early changes of cholecystitis not excluded. Unenhanced appearance of the pancreas, spleen, kidneys, adrenal glands, and inferior vena cava are unremarkable. Calcification of the abdominal aorta without aneurysm. Low-attenuation lymph node between the aorta and IVC measures 12 mm in is likely reactive. Stomach, small bowel, and colon are decompressed. Scattered diverticula in the colon. No free air or free fluid in the abdomen. No abnormal retroperitoneal fluid collections.  Pelvis: Diverticulosis of the sigmoid colon without inflammatory change. Uterus appears surgically absent. No pelvic mass or lymphadenopathy. No free or loculated pelvic fluid collections. Appendix is not identified. Bladder wall is not thickened. Small left inguinal hernia containing fat. Degenerative changes in the spine and hips.  IMPRESSION: Possible inflammatory changes around the gallbladder. Correlate clinically for suspicion of cholecystitis. Cyst in the liver. No acute process otherwise identified.   Electronically Signed   By: Lucienne Capers M.D.   On: 08/13/2014 20:24   Dg Lumbar Spine Complete  08/18/2014   CLINICAL DATA:  Right greater than left hip pain. Back pain for 5 days. Patient fell tissue is scanning from the toilet. Slipped and fell on the floor at home.  EXAM: LUMBAR SPINE - COMPLETE 4+ VIEW  COMPARISON:  CT of the  abdomen and pelvis 08/13/2014  FINDINGS: There is normal alignment of lumbar spine. Degenerative changes are mild. There is dense calcification of the abdominal aorta.  IMPRESSION: No evidence for acute   abnormality.   Electronically Signed   By: Nolon Nations M.D.   On: 08/18/2014 16:36   Ct Head Wo Contrast  08/13/2014   CLINICAL DATA:  Rectal bleeding. Anti coagulation. Bruising along the back of the neck.  EXAM: CT HEAD WITHOUT CONTRAST  CT CERVICAL SPINE WITHOUT CONTRAST  TECHNIQUE: Multidetector CT imaging of the head and cervical spine was performed following the standard protocol without intravenous contrast. Multiplanar CT image reconstructions of the cervical spine were also generated.  COMPARISON:  10/14/2009  FINDINGS: CT HEAD FINDINGS  The brainstem, cerebellum, cerebral peduncles, thalamus, basal ganglia, basilar cisterns, and ventricular system appear within normal limits. Periventricular white matter and corona radiata hypodensities favor chronic ischemic microvascular white matter disease. No intracranial hemorrhage, mass lesion, or acute CVA.  Mild chronic right sphenoid sinusitis. Right mastoid effusion. There is atherosclerotic calcification of the cavernous carotid arteries bilaterally.  CT CERVICAL SPINE FINDINGS  No cervical spine fracture or significant abnormal subluxation. No prevertebral soft tissue swelling or significant bony lesion observed. Clips are present in the left neck. No cervical spine  Clips are present in the left neck I do not observe a significant hematoma in the visualized portion of the neck, although please note that the entirety of the neck is not included. The spinous processes appear unremarkable.  IMPRESSION: 1. No acute intracranial findings or acute cervical spine findings. 2. Periventricular white matter and corona radiata hypodensities favor chronic ischemic microvascular white matter disease. 3. Right mastoid effusion with mild chronic right sphenoid sinusitis. 4. No significant hematoma is seen in the visualized portion of the the neck.   Electronically Signed   By: Van Clines M.D.   On: 08/13/2014 20:22   Ct Cervical Spine Wo  Contrast  08/13/2014   CLINICAL DATA:  Rectal bleeding. Anti coagulation. Bruising along the back of the neck.  EXAM: CT HEAD WITHOUT CONTRAST  CT CERVICAL SPINE WITHOUT CONTRAST  TECHNIQUE: Multidetector CT imaging of the head and cervical spine was performed following the standard protocol without intravenous contrast. Multiplanar CT image reconstructions of the cervical spine were also generated.  COMPARISON:  10/14/2009  FINDINGS: CT HEAD FINDINGS  The brainstem, cerebellum, cerebral peduncles, thalamus, basal ganglia, basilar cisterns, and ventricular system appear within normal limits. Periventricular white matter and corona radiata hypodensities favor chronic ischemic microvascular white matter disease. No intracranial hemorrhage, mass lesion, or acute CVA.  Mild chronic right sphenoid sinusitis. Right mastoid effusion. There is atherosclerotic calcification of the cavernous carotid arteries bilaterally.  CT CERVICAL SPINE FINDINGS  No cervical spine fracture or significant abnormal subluxation. No prevertebral soft tissue swelling or significant bony lesion observed. Clips are present in the left neck. No cervical spine  Clips are present in the left neck I do not observe a significant hematoma in the visualized portion of the neck, although please note that the entirety of the neck is not included. The spinous processes appear unremarkable.  IMPRESSION: 1. No acute intracranial findings or acute cervical spine findings. 2. Periventricular white matter and corona radiata hypodensities favor chronic ischemic microvascular white matter disease. 3. Right mastoid effusion with mild chronic right sphenoid sinusitis. 4. No significant hematoma is seen in the visualized portion of the the neck.   Electronically Signed   By: Van Clines M.D.   On:  08/13/2014 20:22   Nm Hepatobiliary Including Gb  08/14/2014   CLINICAL DATA:  Abdominal pain.  Nausea.  Pancreatitis.  EXAM: NUCLEAR MEDICINE HEPATOBILIARY  IMAGING  TECHNIQUE: Sequential images of the abdomen were obtained out to 60 minutes following intravenous administration of radiopharmaceutical.  RADIOPHARMACEUTICALS:  5.0 [employer] HD-62I Choletec  COMPARISON:  None.  FINDINGS: Prompt radiopharmaceutical uptake by the liver is seen. Liver is normal in appearance.  Prompt biliary excretion of activity is seen. Gallbladder activity is seen initially on the 20 minutes image. Biliary activity reaches the small bowel initially on the 25 min image.  IMPRESSION: Normal hepatobiliary scan. No evidence of cystic duct or biliary obstruction   Electronically Signed   By: Earle Gell M.D.   On: 08/14/2014 14:22   US Abdomen Complete  08/20/2014   CLINICAL DATA:  Subsequent encounter for abnormal gallbladder.  EXAM: ULTRASOUND ABDOMEN COMPLETE  COMPARISON:  Ultrasound exam 08/13/2014.  CT scan from 08/13/2014.  FINDINGS: Gallbladder: Gallbladder is moderately distended. Tiny dependent stones measure about 5 mm. There is no gallbladder wall thickening. No pericholecystic fluid. The sonographer reports no sonographic Murphy sign.  Common bile duct: Diameter: 3 mm  Liver: Cystic areas are evident within the liver parenchyma. These were seen on the recent CT scan and are stable comparing back to a CT scan from 08/10/2011. Imaging features are most consistent with cysts.  IVC: No abnormality visualized.  Pancreas: Visualized portion unremarkable.  Spleen: Size and appearance within normal limits.  Right Kidney: Length: 9.8 cm. Echogenicity within normal limits. No mass or hydronephrosis visualized.  Left Kidney: Length: 10.7 cm. Echogenicity within normal limits. No mass or hydronephrosis visualized.  Abdominal aorta: Not well seen secondary to bowel gas.  Other findings: None.  IMPRESSION: Probable tiny gallstone with distended gallbladder. No pericholecystic fluid or gallbladder wall thickening. The sonographer reports no sonographic Murphy sign.   Electronically Signed    By: Misty Stanley M.D.   On: 08/20/2014 15:54   Dg Chest Port 1 View  08/19/2014   CLINICAL DATA:  81 year old female with leukocytosis.  EXAM: PORTABLE CHEST - 1 VIEW  COMPARISON:  Prior chest x-ray 08/13/2014  FINDINGS: Stable cardiomegaly. Atherosclerotic and slightly tortuous thoracic aorta. Stable position of left subclavian approach cardiac rhythm maintenance device with leads projecting over the right atrium and right ventricle. Slightly increased pulmonary vascular congestion and interstitial prominence with fluid tracking along the minor fissure suggesting mild interstitial edema. Unchanged linear atelectasis versus chronic pleural parenchymal scarring in the left base. No focal airspace consolidation, pleural effusion or pneumothorax. No acute osseous abnormality.  IMPRESSION: 1. Mild interstitial edema. In the setting of cardiomegaly this suggests early CHF. 2. Stable left basilar scarring versus chronic atelectasis. 3. No focal airspace consolidation to suggest pneumonia.   Electronically Signed   By: Jacqulynn Cadet M.D.   On: 08/19/2014 12:01   Dg Chest Port 1 View  08/13/2014   CLINICAL DATA:  Rectal bleeding. Dark colored urine for 3 days. Fall 10 days ago. Back pain.  EXAM: PORTABLE CHEST - 1 VIEW  COMPARISON:  06/25/2012  FINDINGS: Moderate enlargement of the cardiopericardial silhouette observed with indistinct pulmonary vasculature, cephalization of blood flow, and bilateral interstitial accentuation in the lungs. Dual lead pacer remains in place. Prior median sternotomy.  Atherosclerotic calcification of the aortic arch. Bandlike opacities in the retrocardiac region likely reflecting left lower lobe atelectasis. Prior blunting of the costophrenic angles appears improved although this may be positional. The overall degree of interstitial edema  appears mildly increased.  IMPRESSION: 1. Moderate enlargement of the cardiopericardial silhouette with interstitial edema. Reduced conspicuity of  prior pleural effusions. 2. Suspected bandlike atelectasis at the left lung base. 3. Dual lead pacer remains in place.   Electronically Signed   By: Van Clines M.D.   On: 08/13/2014 20:33   Dg Hips Bilat With Pelvis 3-4 Views  08/18/2014   CLINICAL DATA:  Right greater than left hip pain. Back pain for 5 days. Status post fall while getting out from the toilet. Patient slipped and fell on the floor at her home.  EXAM: BILATERAL HIP (WITH PELVIS) 3-4 VIEWS  COMPARISON:  None.  FINDINGS: There is no evidence of fracture or other focal bone lesions. Soft tissues are unremarkable.  IMPRESSION: Negative.   Electronically Signed   By: Nolon Nations M.D.   On: 08/18/2014 16:53   US Abdomen Limited Ruq  08/13/2014   CLINICAL DATA:  Abdominal pain. Recent CT with questionable inflammatory change about the gallbladder.  EXAM: US ABDOMEN LIMITED - RIGHT UPPER QUADRANT  COMPARISON:  CT abdomen/ pelvis earlier this day.  FINDINGS: Gallbladder:  Elongated spanning 11.2 cm. Contains dependent sludge and small echogenic stones. Normal gallbladder wall thickness of 2 mm. No pericholecystic fluid. No sonographic Murphy sign noted.  Common bile duct:  Diameter: 5 mm.  Liver:  Two hepatic cysts again seen, largest measures 3.5 x 2.1 x 2.9 cm. Otherwise within normal limits in parenchymal echogenicity.  IMPRESSION: 1. Distended gallbladder containing dependent sludge and small stones. There are no secondary sonographic findings of acute cholecystitis. Wall thickness is normal, there is a negative sonographic Murphy sign. No biliary dilatation. 2. Hepatic cysts.   Electronically Signed   By: Jeb Levering M.D.   On: 08/13/2014 22:49    Discharge Exam: Filed Vitals:   08/22/14 0558  BP: 165/59  Pulse: 60  Temp: 98.3 F (36.8 C)  Resp: 18   Filed Vitals:   08/21/14 1414 08/21/14 1812 08/21/14 2212 08/22/14 0558  BP: 118/61 108/52 106/41 165/59  Pulse: 60   60  Temp: 97.9 F (36.6 C)  97.6 F (36.4 C)  98.3 F (36.8 C)  TempSrc: Oral  Oral Oral  Resp:   18 18  Height:      Weight:    82.872 kg (182 lb 11.2 oz)  SpO2: 96%  94% 97%    General: Pt is alert, follows commands appropriately, not in acute distress Cardiovascular: Irregular rate and rhythm, no rubs, no gallops Respiratory: Clear to auscultation bilaterally, no wheezing, diminished breath sounds at bases Abdominal: Soft, tender in epigastric area, non distended, bowel sounds +, no guarding  Discharge Instructions     Medication List    STOP taking these medications        amoxicillin 875 MG tablet  Commonly known as:  AMOXIL     aspirin 81 MG tablet     ferrous sulfate 325 (65 FE) MG tablet     fexofenadine 180 MG tablet  Commonly known as:  ALLEGRA     FLUARIX QUADRIVALENT 0.5 ML injection  Generic drug:  Influenza vac split quadrivalent PF     furosemide 40 MG tablet  Commonly known as:  LASIX     potassium chloride SA 20 MEQ tablet  Commonly known as:  K-DUR,KLOR-CON     quinapril 20 MG tablet  Commonly known as:  ACCUPRIL     warfarin 2.5 MG tablet  Commonly known as:  COUMADIN  TAKE these medications        acetaminophen 325 MG tablet  Commonly known as:  TYLENOL  Take 1-2 tablets (325-650 mg total) by mouth every 4 (four) hours as needed.     ALIGN PO  Take 1 capsule by mouth every morning.     diltiazem 180 MG 24 hr capsule  Commonly known as:  CARDIZEM CD  Take 1 capsule (180 mg total) by mouth every evening.     fluticasone 50 MCG/ACT nasal spray  Commonly known as:  FLONASE  Place 2 sprays into both nostrils daily. Bid for 1 week then qd 1 month     folic acid 1 MG tablet  Commonly known as:  FOLVITE  Take 1 tablet (1 mg total) by mouth daily.     guaiFENesin 600 MG 12 hr tablet  Commonly known as:  MUCINEX  Take 1,200 mg by mouth 2 (two) times daily.     hydrocortisone 25 MG suppository  Commonly known as:  ANUSOL-HC  Place 1 suppository (25 mg total) rectally 2 (two)  times daily.     ICAPS AREDS FORMULA PO  Take 1 capsule by mouth 2 (two) times daily. PER PATIENT THIS IS ICAPS AREDS #2     levalbuterol 0.31 MG/3ML nebulizer solution  Commonly known as:  XOPENEX  Take 1 ampule by nebulization 3 (three) times daily.     levothyroxine 100 MCG tablet  Commonly known as:  SYNTHROID, LEVOTHROID  Take one tablet by mouth once daily before breakfast     LORazepam 1 MG tablet  Commonly known as:  ATIVAN  Take 1 tablet (1 mg total) by mouth every 6 (six) hours as needed for anxiety.     meclizine 25 MG tablet  Commonly known as:  ANTIVERT  Take 1 tablet (25 mg total) by mouth at bedtime.     metoprolol 100 MG tablet  Commonly known as:  LOPRESSOR  Take 1.5 tablets (150 mg total) by mouth 2 (two) times daily.     mometasone 50 MCG/ACT nasal spray  Commonly known as:  NASONEX  Place 2 sprays into the nose 2 (two) times daily.     morphine CONCENTRATE 10 MG/0.5ML Soln concentrated solution  Take 0.25 mLs (5 mg total) by mouth every 2 (two) hours as needed for severe pain.     OMEGA 3-6-9 COMPLEX PO  Take 1 capsule by mouth 2 (two) times daily.     oxyCODONE 5 MG immediate release tablet  Commonly known as:  Oxy IR/ROXICODONE  Take 1 tablet (5 mg total) by mouth every 4 (four) hours as needed for moderate pain.     PACERONE 200 MG tablet  Generic drug:  amiodarone  TAKE ONE TABLET BY MOUTH ONCE DAILY     PROAIR HFA 108 (90 BASE) MCG/ACT inhaler  Generic drug:  albuterol  INHALE TWO PUFFS BY MOUTH EVERY 6 HOURS AS NEEDED     triamcinolone 55 MCG/ACT Aero nasal inhaler  Commonly known as:  NASACORT AQ  1 spray each nostril twice a day            Follow-up Information    Follow up with DOOLITTLE, Linton Ham, MD.   Specialties:  Internal Medicine, Adolescent Medicine   Why:  As needed   Contact information:   [place] [address] [phone]        The results of significant diagnostics from this hospitalization  (including imaging, microbiology, ancillary and laboratory) are listed below  for reference.     Microbiology: No results found for this or any previous visit (from the past 240 hour(s)).   Labs: Basic Metabolic Panel:  Recent Labs Lab 08/18/14 0550 08/19/14 0542 08/20/14 0500 08/21/14 0540 08/22/14 0540  NA 139 136 136 134* 134*  K 4.4 4.1 4.1 4.0 4.7  CL 99 99 98 96 98  CO2 32 32 30 28 28   GLUCOSE 101* 106* 106* 111* 85  BUN 25* 31* 37* 44* 49*  CREATININE 1.20* 1.53* 1.98* 2.07* 2.04*  CALCIUM 8.5 8.1* 7.8* 7.6* 7.6*   Liver Function Tests:  Recent Labs Lab 08/16/14 0500 08/17/14 0515 08/20/14 0500 08/21/14 0540 08/22/14 0540  AST 66* 67* 147* 320* 564*  ALT 106* 99* 113* 194* 314*  ALKPHOS 147* 146* 124* 127* 115  BILITOT 1.6* 1.6* 2.0* 2.0* 2.6*  PROT 6.3 6.5 5.7* 5.6* 5.3*  ALBUMIN 2.6* 2.7* 2.2* 2.3* 2.1*    Recent Labs Lab 08/17/14 0515 08/22/14 0540  LIPASE 65* 35   CBC:  Recent Labs Lab 08/16/14 0500 08/19/14 0540 08/20/14 0500 08/21/14 0540 08/22/14 0540  WBC 6.8 14.7* 15.7* 16.3* 16.4*  HGB 12.2 11.9* 10.9* 11.6* 11.5*  HCT 39.4 38.9 34.3* 36.5 35.5*  MCV 98.0 98.2 96.9 95.5 94.2  PLT 254 256 242 235 220    BNP (last 3 results)  Recent Labs  08/13/14 1733 08/21/14 0540  BNP 1326.6* 878.4*   SIGNED: Time coordinating discharge: Over 30 minutes  Faye Ramsay, MD  [facility] 08/22/2014, 11:46 AM Pager [phone]  If 7PM-7AM, please contact night-coverage [email or web address] Password [email or web address]

## 2014-08-22 NOTE — Progress Notes (Signed)
SUBJECTIVE:  Has decided on comfort care.  OBJECTIVE:   Vitals:   Filed Vitals:   08/21/14 1414 08/21/14 1812 08/21/14 2212 08/22/14 0558  BP: 118/61 108/52 106/41 165/59  Pulse: 60   60  Temp: 97.9 F (36.6 C)  97.6 F (36.4 C) 98.3 F (36.8 C)  TempSrc: Oral  Oral Oral  Resp:   18 18  Height:      Weight:    182 lb 11.2 oz (82.872 kg)  SpO2: 96%  94% 97%   I&O's:    Intake/Output Summary (Last 24 hours) at 08/22/14 0735 Last data filed at 08/21/14 2300  Gross per 24 hour  Intake     50 ml  Output    150 ml  Net   -100 ml   TELEMETRY: Off tele now:     PHYSICAL EXAM General: Well developed, well nourished, resting comfortably Head:   Normal cephalic and atramatic  Lungs:  No wheezing. Heart:   HRRR S1 S2  No JVD.    Skin: No rash   LABS: Basic Metabolic Panel:  Recent Labs  08/21/14 0540 08/22/14 0540  NA 134* 134*  K 4.0 4.7  CL 96 98  CO2 28 28  GLUCOSE 111* 85  BUN 44* 49*  CREATININE 2.07* 2.04*  CALCIUM 7.6* 7.6*   Liver Function Tests:  Recent Labs  08/21/14 0540 08/22/14 0540  AST 320* 564*  ALT 194* 314*  ALKPHOS 127* 115  BILITOT 2.0* 2.6*  PROT 5.6* 5.3*  ALBUMIN 2.3* 2.1*    Recent Labs  08/22/14 0540  LIPASE 35   CBC:  Recent Labs  08/21/14 0540 08/22/14 0540  WBC 16.3* 16.4*  HGB 11.6* 11.5*  HCT 36.5 35.5*  MCV 95.5 94.2  PLT 235 220   Cardiac Enzymes: No results for input(s): CKTOTAL, CKMB, CKMBINDEX, TROPONINI in the last 72 hours. BNP: Invalid input(s): POCBNP D-Dimer: No results for input(s): DDIMER in the last 72 hours. Hemoglobin A1C: No results for input(s): HGBA1C in the last 72 hours. Fasting Lipid Panel: No results for input(s): CHOL, HDL, LDLCALC, TRIG, CHOLHDL, LDLDIRECT in the last 72 hours. Thyroid Function Tests: No results for input(s): TSH, T4TOTAL, T3FREE, THYROIDAB in the last 72 hours.  Invalid input(s): FREET3 Anemia Panel: No results for input(s): VITAMINB12, FOLATE,  FERRITIN, TIBC, IRON, RETICCTPCT in the last 72 hours. Coag Panel:   Lab Results  Component Value Date   INR 2.13* 08/21/2014   INR 2.45* 08/20/2014   INR 2.66* 08/19/2014    RADIOLOGY: Ct Abdomen Pelvis Wo Contrast  08/13/2014   CLINICAL DATA:  Bleeding.  No other indication provided.  EXAM: CT ABDOMEN AND PELVIS WITHOUT CONTRAST  TECHNIQUE: Multidetector CT imaging of the abdomen and pelvis was performed following the standard protocol without IV contrast.  COMPARISON:  08/10/2011  FINDINGS: Mild dependent changes in the lung bases. Diffuse cardiac enlargement. Postoperative changes in the mediastinum. Calcification in the mitral valve annulus.  Evaluation of solid organs and vascular structures is limited without IV contrast material. Circumscribed low-attenuation lesion in segment 4 of the liver measuring 2.3 x 3.6 cm. This is unchanged since prior study and probably represents a cyst. Appearance of the gallbladder is nonspecific. There is is suggestion of mild gallbladder wall thickening and infiltration. Single tiny stone identified. Early changes of cholecystitis not excluded. Unenhanced appearance of the pancreas, spleen, kidneys, adrenal glands, and inferior vena cava are unremarkable. Calcification of the abdominal aorta without aneurysm. Low-attenuation lymph node between the  aorta and IVC measures 12 mm in is likely reactive. Stomach, small bowel, and colon are decompressed. Scattered diverticula in the colon. No free air or free fluid in the abdomen. No abnormal retroperitoneal fluid collections.  Pelvis: Diverticulosis of the sigmoid colon without inflammatory change. Uterus appears surgically absent. No pelvic mass or lymphadenopathy. No free or loculated pelvic fluid collections. Appendix is not identified. Bladder wall is not thickened. Small left inguinal hernia containing fat. Degenerative changes in the spine and hips.  IMPRESSION: Possible inflammatory changes around the gallbladder.  Correlate clinically for suspicion of cholecystitis. Cyst in the liver. No acute process otherwise identified.   Electronically Signed   By: Lucienne Capers M.D.   On: 08/13/2014 20:24   Dg Lumbar Spine Complete  08/18/2014   CLINICAL DATA:  Right greater than left hip pain. Back pain for 5 days. Patient fell tissue is scanning from the toilet. Slipped and fell on the floor at home.  EXAM: LUMBAR SPINE - COMPLETE 4+ VIEW  COMPARISON:  CT of the abdomen and pelvis 08/13/2014  FINDINGS: There is normal alignment of lumbar spine. Degenerative changes are mild. There is dense calcification of the abdominal aorta.  IMPRESSION: No evidence for acute  abnormality.   Electronically Signed   By: Nolon Nations M.D.   On: 08/18/2014 16:36   Ct Head Wo Contrast  08/13/2014   CLINICAL DATA:  Rectal bleeding. Anti coagulation. Bruising along the back of the neck.  EXAM: CT HEAD WITHOUT CONTRAST  CT CERVICAL SPINE WITHOUT CONTRAST  TECHNIQUE: Multidetector CT imaging of the head and cervical spine was performed following the standard protocol without intravenous contrast. Multiplanar CT image reconstructions of the cervical spine were also generated.  COMPARISON:  10/14/2009  FINDINGS: CT HEAD FINDINGS  The brainstem, cerebellum, cerebral peduncles, thalamus, basal ganglia, basilar cisterns, and ventricular system appear within normal limits. Periventricular white matter and corona radiata hypodensities favor chronic ischemic microvascular white matter disease. No intracranial hemorrhage, mass lesion, or acute CVA.  Mild chronic right sphenoid sinusitis. Right mastoid effusion. There is atherosclerotic calcification of the cavernous carotid arteries bilaterally.  CT CERVICAL SPINE FINDINGS  No cervical spine fracture or significant abnormal subluxation. No prevertebral soft tissue swelling or significant bony lesion observed. Clips are present in the left neck. No cervical spine  Clips are present in the left neck I do  not observe a significant hematoma in the visualized portion of the neck, although please note that the entirety of the neck is not included. The spinous processes appear unremarkable.  IMPRESSION: 1. No acute intracranial findings or acute cervical spine findings. 2. Periventricular white matter and corona radiata hypodensities favor chronic ischemic microvascular white matter disease. 3. Right mastoid effusion with mild chronic right sphenoid sinusitis. 4. No significant hematoma is seen in the visualized portion of the the neck.   Electronically Signed   By: Van Clines M.D.   On: 08/13/2014 20:22   Ct Cervical Spine Wo Contrast  08/13/2014   CLINICAL DATA:  Rectal bleeding. Anti coagulation. Bruising along the back of the neck.  EXAM: CT HEAD WITHOUT CONTRAST  CT CERVICAL SPINE WITHOUT CONTRAST  TECHNIQUE: Multidetector CT imaging of the head and cervical spine was performed following the standard protocol without intravenous contrast. Multiplanar CT image reconstructions of the cervical spine were also generated.  COMPARISON:  10/14/2009  FINDINGS: CT HEAD FINDINGS  The brainstem, cerebellum, cerebral peduncles, thalamus, basal ganglia, basilar cisterns, and ventricular system appear within normal limits. Periventricular white  matter and corona radiata hypodensities favor chronic ischemic microvascular white matter disease. No intracranial hemorrhage, mass lesion, or acute CVA.  Mild chronic right sphenoid sinusitis. Right mastoid effusion. There is atherosclerotic calcification of the cavernous carotid arteries bilaterally.  CT CERVICAL SPINE FINDINGS  No cervical spine fracture or significant abnormal subluxation. No prevertebral soft tissue swelling or significant bony lesion observed. Clips are present in the left neck. No cervical spine  Clips are present in the left neck I do not observe a significant hematoma in the visualized portion of the neck, although please note that the entirety of the  neck is not included. The spinous processes appear unremarkable.  IMPRESSION: 1. No acute intracranial findings or acute cervical spine findings. 2. Periventricular white matter and corona radiata hypodensities favor chronic ischemic microvascular white matter disease. 3. Right mastoid effusion with mild chronic right sphenoid sinusitis. 4. No significant hematoma is seen in the visualized portion of the the neck.   Electronically Signed   By: Van Clines M.D.   On: 08/13/2014 20:22   Nm Hepatobiliary Including Gb  08/14/2014   CLINICAL DATA:  Abdominal pain.  Nausea.  Pancreatitis.  EXAM: NUCLEAR MEDICINE HEPATOBILIARY IMAGING  TECHNIQUE: Sequential images of the abdomen were obtained out to 60 minutes following intravenous administration of radiopharmaceutical.  RADIOPHARMACEUTICALS:  5.0 [employer] MV-[id number] Choletec  COMPARISON:  None.  FINDINGS: Prompt radiopharmaceutical uptake by the liver is seen. Liver is normal in appearance.  Prompt biliary excretion of activity is seen. Gallbladder activity is seen initially on the 20 minutes image. Biliary activity reaches the small bowel initially on the 25 min image.  IMPRESSION: Normal hepatobiliary scan. No evidence of cystic duct or biliary obstruction   Electronically Signed   By: Earle Gell M.D.   On: 08/14/2014 14:22   US Abdomen Complete  08/20/2014   CLINICAL DATA:  Subsequent encounter for abnormal gallbladder.  EXAM: ULTRASOUND ABDOMEN COMPLETE  COMPARISON:  Ultrasound exam 08/13/2014.  CT scan from 08/13/2014.  FINDINGS: Gallbladder: Gallbladder is moderately distended. Tiny dependent stones measure about 5 mm. There is no gallbladder wall thickening. No pericholecystic fluid. The sonographer reports no sonographic Murphy sign.  Common bile duct: Diameter: 3 mm  Liver: Cystic areas are evident within the liver parenchyma. These were seen on the recent CT scan and are stable comparing back to a CT scan from 08/10/2011. Imaging features are most  consistent with cysts.  IVC: No abnormality visualized.  Pancreas: Visualized portion unremarkable.  Spleen: Size and appearance within normal limits.  Right Kidney: Length: 9.8 cm. Echogenicity within normal limits. No mass or hydronephrosis visualized.  Left Kidney: Length: 10.7 cm. Echogenicity within normal limits. No mass or hydronephrosis visualized.  Abdominal aorta: Not well seen secondary to bowel gas.  Other findings: None.  IMPRESSION: Probable tiny gallstone with distended gallbladder. No pericholecystic fluid or gallbladder wall thickening. The sonographer reports no sonographic Murphy sign.   Electronically Signed   By: Misty Stanley M.D.   On: 08/20/2014 15:54   Dg Chest Port 1 View  08/19/2014   CLINICAL DATA:  81 year old female with leukocytosis.  EXAM: PORTABLE CHEST - 1 VIEW  COMPARISON:  Prior chest x-ray 08/13/2014  FINDINGS: Stable cardiomegaly. Atherosclerotic and slightly tortuous thoracic aorta. Stable position of left subclavian approach cardiac rhythm maintenance device with leads projecting over the right atrium and right ventricle. Slightly increased pulmonary vascular congestion and interstitial prominence with fluid tracking along the minor fissure suggesting mild interstitial edema. Unchanged linear atelectasis versus  chronic pleural parenchymal scarring in the left base. No focal airspace consolidation, pleural effusion or pneumothorax. No acute osseous abnormality.  IMPRESSION: 1. Mild interstitial edema. In the setting of cardiomegaly this suggests early CHF. 2. Stable left basilar scarring versus chronic atelectasis. 3. No focal airspace consolidation to suggest pneumonia.   Electronically Signed   By: Jacqulynn Cadet M.D.   On: 08/19/2014 12:01   Dg Chest Port 1 View  08/13/2014   CLINICAL DATA:  Rectal bleeding. Dark colored urine for 3 days. Fall 10 days ago. Back pain.  EXAM: PORTABLE CHEST - 1 VIEW  COMPARISON:  06/25/2012  FINDINGS: Moderate enlargement of the  cardiopericardial silhouette observed with indistinct pulmonary vasculature, cephalization of blood flow, and bilateral interstitial accentuation in the lungs. Dual lead pacer remains in place. Prior median sternotomy.  Atherosclerotic calcification of the aortic arch. Bandlike opacities in the retrocardiac region likely reflecting left lower lobe atelectasis. Prior blunting of the costophrenic angles appears improved although this may be positional. The overall degree of interstitial edema appears mildly increased.  IMPRESSION: 1. Moderate enlargement of the cardiopericardial silhouette with interstitial edema. Reduced conspicuity of prior pleural effusions. 2. Suspected bandlike atelectasis at the left lung base. 3. Dual lead pacer remains in place.   Electronically Signed   By: Van Clines M.D.   On: 08/13/2014 20:33   Dg Hips Bilat With Pelvis 3-4 Views  08/18/2014   CLINICAL DATA:  Right greater than left hip pain. Back pain for 5 days. Status post fall while getting out from the toilet. Patient slipped and fell on the floor at her home.  EXAM: BILATERAL HIP (WITH PELVIS) 3-4 VIEWS  COMPARISON:  None.  FINDINGS: There is no evidence of fracture or other focal bone lesions. Soft tissues are unremarkable.  IMPRESSION: Negative.   Electronically Signed   By: Nolon Nations M.D.   On: 08/18/2014 16:53   US Abdomen Limited Ruq  08/13/2014   CLINICAL DATA:  Abdominal pain. Recent CT with questionable inflammatory change about the gallbladder.  EXAM: US ABDOMEN LIMITED - RIGHT UPPER QUADRANT  COMPARISON:  CT abdomen/ pelvis earlier this day.  FINDINGS: Gallbladder:  Elongated spanning 11.2 cm. Contains dependent sludge and small echogenic stones. Normal gallbladder wall thickness of 2 mm. No pericholecystic fluid. No sonographic Murphy sign noted.  Common bile duct:  Diameter: 5 mm.  Liver:  Two hepatic cysts again seen, largest measures 3.5 x 2.1 x 2.9 cm. Otherwise within normal limits in parenchymal  echogenicity.  IMPRESSION: 1. Distended gallbladder containing dependent sludge and small stones. There are no secondary sonographic findings of acute cholecystitis. Wall thickness is normal, there is a negative sonographic Murphy sign. No biliary dilatation. 2. Hepatic cysts.   Electronically Signed   By: Jeb Levering M.D.   On: 08/13/2014 22:49      ASSESSMENT: Kathyrn Lass:   1) Acute on chronic diastolic heart failure: ARF with [place] 2/28. Agree with holding diuretics for now.  Consider restarting only when renal function normalizes if it would be helpful for sx.  Currently, she does not appear SHOB.  Low albumin may explain 3rd spacing and renal failure with diuretics.  Encourage PO intake.  Today's electrolytes show renal function stabilizing.  LFTs increasing.  2) Mild to moderate MR by recent echo with EF 60-65%.  3) AFib: Warfarin for stroke prevention.  Therapeutic on 08/21/14.   Treatment to be limited due to the patient's request for comfort care only.  Will sign off at this  time.  Please call with questions.  Jettie Booze, MD  08/22/2014  7:35 AM

## 2014-08-22 NOTE — Progress Notes (Signed)
Gave report to [facility]. All questions answered. [facility] at bedside.

## 2014-08-22 NOTE — Progress Notes (Signed)
CSW received call from Beverlee Nims at [facility] stating that patient has been accepted to their hospice home. Patient is set to discharge to [facility] [place] today. Patient & family at bedside aware. Discharge packet given to RN, Sonia Baller. PTAR called for transport to pickup at 3:00pm.     Raynaldo Opitz, [place] Worker cell #: [phone]

## 2014-08-22 NOTE — Consult Note (Signed)
Patient OH:[id number] A Vigo      DOB: 12-08-1927      IRS:[id number]     Consult Note from the Palliative Medicine Team at [facility] Requested by: Dr Renne Crigler     PCP: Leandrew Koyanagi, MD Reason for Consultation: [place]     Phone Number:[phone]  Assessment/Recommendations:  1.  Code Status: DNR  2. GOC: Comfort only.  Met today with patient/husband/children. Family does not thing home would be good option as she stays at assisted living facility.  I think she would likely qualify for residential hospice facility (though up to individual hospice agency to decide). Renal function trended slightly up past few days, severe pulm HTN/HOCM and AKI limiting diuresis.  Not really eaten much in several days. Family/patient in agreement that this would likely be best place to go from here.  I have placed SW consult to refer.    3. Symptom Management:   1. Abdominal Pain- In setting of pancreatitis/elevated LFT's.  Only taking 1-2 doses of oxycodone per day. Appears uncomfortable so will give now dose of hydromorphone (would avoid morphine with AKI and codeine allergy).  Also have PRN dilaudid available. Schedule oxycodone q6h. Will see if this helps pain control 2. Anxiety- Ativan PRN.  I sense some of this is worry about prolonged suffering.  Pastor visited yesterday and I am unable to tell if there is spiritual or existential component.   4. Psychosocial/Spiritual: Married for 63 years. Home pastor visited with her yesterday.    Brief HPI:  81 yo female with PMHx of CHF, Afib, HOCM, severe pulm HTN in setting of MR, Chronic Resp Failure.  She was admitted on 2/22 with a fall and epigastric abdominal pain.  This was associated with weakness and BRBPR. On admission, suspected pancreatitis with elevated LFT's, AKI. Prolonged hospital stay with acute on Chronic CHF with severe Pulm HTN, recurrent leukocytosis, recurrent AKI with diuresis. Patient/Family elected to pursue comfort  measures and palliative care consulted as family reportedly had questions.    In speaking with Mrs Serda today, she mostly complains of abdomen and leg pain. Does not seem sure if pain medication helping.  Family concerned that she is anxious. Repeats that she is ready to "be with jesus".  Does not want to take any unnecessary medications.  No other complaints that I can elicit today. Has really not eaten in few days per family.     PMH:  Past Medical History  Diagnosis Date  . Hypertension   . Hyperlipidemia   . Heart murmur   . GERD (gastroesophageal reflux disease)   . Joint pain     left knee  . Dizziness   . Chest tightness 02/09/2008  . Hiatal hernia   . CHF (congestive heart failure)   . Angina   . Coronary artery disease   . Hypertrophic cardiomyopathy     subvalvular gradient 45-mm  . Dysrhythmia     hx of atrial fibrilation  . On home oxygen therapy     "2L at rest; 3L w/activity" (06/24/2012)  . Pacemaker 06/24/2012  . Melanoma of back 1961  . Complication of anesthesia   . Claustrophobia     "severe" (06/24/2012)  . Carotid artery occlusion   . Pneumonia     "lastest was 04/26/2011; multiple times before that" (06/24/2012)  . Chronic bronchitis     "used to get it often" (06/24/2012)  . Shortness of breath     "all the time" (06/24/2012)  .  Hypothyroidism   . Stroke 2012    hx of TIA  . Arthritis     "2 fingers" (06/24/2012)  . PAF (paroxysmal atrial fibrillation) 06/24/2012  . Symptomatic bradycardia 06/24/2012  . S/P cardiac pacemaker procedure, insertion [employer] device 06/24/12 06/24/2012  . Carotid artery disease 09/21/2011    40-59% R ICA stenosis; patent L carotid endarterectomy w/minimal restenosis; bilateral vertebral arteries are antegrade  . Cataract   . Atrial fibrillation   . Acute on chronic diastolic heart failure      PSH: Past Surgical History  Procedure Laterality Date  . 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 Microlaryngoscopy with co2 laser and excision of vocal cord lesion  1983  . Carotid endarterectomy  1990's    "left" (06/24/2012)  . Cardiac catheterization  03/14/2011    Stent CX 40% with nml ffr  . Tee without cardioversion  05/01/2011    Procedure: TRANSESOPHAGEAL ECHOCARDIOGRAM (TEE);  Surgeon: Sanda Klein;  Location: [facility];  Service: Cardiovascular;  Laterality: N/A;  . Cardioversion  05/01/2011    Procedure: CARDIOVERSION;  Surgeon: Dani Gobble Croitoru;  Location: [facility];  Service: Cardiovascular;  Laterality: N/A;  . Cardioversion  06/30/2011    Procedure: CARDIOVERSION;  Surgeon: Sanda Klein, MD;  Location: [facility];  Service: Cardiovascular;  Laterality: N/A;  . Insert / replace / remove pacemaker  06/24/2012    dual chamber (06/24/2012)  . Tonsillectomy  1940's?  . Appendectomy  1940's  . Abdominal hysterectomy  1970's  . Dilation and curettage of uterus  1960's - 1970's    "alot" (06/24/2012)  . Cataract extraction w/ intraocular lens  implant, bilateral  2000's  . Melanoma excision  1961    "off my back" (06/24/2012)  . Microlaryngoscopy with co2 laser and excision of vocal cord lesion    . Transthoracic echocardiogram  05/17/2012    see Procedures tab  . Cardiovascular stress test  09/13/2007    R/P MV - EF 56%; mild ischemia in basal and mid inferolateral regions; global LV systolic function normal; EKG negative for ischemia; pt experienced chest pain during stress test, resolved spontaneously  . Eye surgery    . Permanent pacemaker insertion N/A 06/24/2012    Procedure: PERMANENT PACEMAKER INSERTION;  Surgeon: Sanda Klein, MD;  Location: [facility];  Service: Cardiovascular;  Laterality: N/A;   I have reviewed the FH and SH and  If appropriate update it with new information. Allergies  Allergen Reactions  . Pseudoephedrine Other (See Comments)    Insomnia (took 1 tablet and could not sleep for 3 days)  . Statins Other (See Comments)    myalgia  . Codeine Nausea  Only and Other (See Comments)    dizziness  . Seldane [Terfenadine] Nausea Only and Other (See Comments)    dizziness   Scheduled Meds: . amiodarone  200 mg Oral Daily  . antiseptic oral rinse  7 mL Mouth Rinse q12n4p  . chlorhexidine  15 mL Mouth Rinse BID  . diltiazem  180 mg Oral QPM  . fluticasone  2 spray Each Nare Daily  . folic acid  1 mg Oral Daily  . hydrocortisone  25 mg Rectal BID  . levalbuterol  0.315 mg Nebulization TID  . levothyroxine  100 mcg Oral QAC breakfast  . loratadine  10 mg Oral Daily  . meclizine  25 mg Oral QHS  . metoprolol  150 mg Oral BID  . polyethylene glycol  17 g Oral Daily  . sodium chloride  3 mL Intravenous Q12H   Continuous Infusions:  PRN Meds:.Glycerin (Adult), morphine injection, morphine CONCENTRATE, ondansetron **OR** ondansetron (ZOFRAN) IV, oxyCODONE    BP 165/59 mmHg  Pulse 60  Temp(Src) 98.3 F (36.8 C) (Oral)  Resp 18  Ht $R'5\' 3"'yF$  (1.6 m)  Wt 82.872 kg (182 lb 11.2 oz)  BMI 32.37 kg/m2  SpO2 97%   PPS: 10   Intake/Output Summary (Last 24 hours) at 08/22/14 1009 Last data filed at 08/22/14 0700  Gross per 24 hour  Intake    150 ml  Output    150 ml  Net      0 ml    Physical Exam:  General: Alert, intermittent confusion, appears slightly uncomfortable HEENT: [place], mmm Neck: supple Chest:  CTAB CVS: Regular rate Abdomen: soft, +BS, diffusely tender Ext: no edema Neuro: intermittent confusion Skin: warm/dry  Labs: CBC    Component Value Date/Time   WBC 16.4* 08/22/2014 0540   WBC 13.4* 11/24/2011 1132   RBC 3.77* 08/22/2014 0540   RBC 4.02 08/14/2014 0010   RBC 4.26 11/24/2011 1132   HGB 11.5* 08/22/2014 0540   HGB 12.8 11/24/2011 1132   HCT 35.5* 08/22/2014 0540   HCT 40.9 11/24/2011 1132   PLT 220 08/22/2014 0540   MCV 94.2 08/22/2014 0540   MCV 96.1 11/24/2011 1132   MCH 30.5 08/22/2014 0540   MCH 30.0 11/24/2011 1132   MCHC 32.4 08/22/2014 0540   MCHC 31.3* 11/24/2011 1132   RDW 15.2 08/22/2014  0540   LYMPHSABS 0.6* 08/14/2014 0010   MONOABS 0.8 08/14/2014 0010   EOSABS 0.1 08/14/2014 0010   BASOSABS 0.0 08/14/2014 0010    BMET    Component Value Date/Time   NA 134* 08/22/2014 0540   K 4.7 08/22/2014 0540   CL 98 08/22/2014 0540   CO2 28 08/22/2014 0540   GLUCOSE 85 08/22/2014 0540   BUN 49* 08/22/2014 0540   CREATININE 2.04* 08/22/2014 0540   CREATININE 1.08 07/04/2014 1332   CALCIUM 7.6* 08/22/2014 0540   GFRNONAA 21* 08/22/2014 0540   GFRNONAA 47* 07/04/2014 1332   GFRAA 25* 08/22/2014 0540   GFRAA 54* 07/04/2014 1332    CMP     Component Value Date/Time   NA 134* 08/22/2014 0540   K 4.7 08/22/2014 0540   CL 98 08/22/2014 0540   CO2 28 08/22/2014 0540   GLUCOSE 85 08/22/2014 0540   BUN 49* 08/22/2014 0540   CREATININE 2.04* 08/22/2014 0540   CREATININE 1.08 07/04/2014 1332   CALCIUM 7.6* 08/22/2014 0540   PROT 5.3* 08/22/2014 0540   ALBUMIN 2.1* 08/22/2014 0540   AST 564* 08/22/2014 0540   ALT 314* 08/22/2014 0540   ALKPHOS 115 08/22/2014 0540   BILITOT 2.6* 08/22/2014 0540   GFRNONAA 21* 08/22/2014 0540   GFRNONAA 47* 07/04/2014 1332   GFRAA 25* 08/22/2014 0540   GFRAA 54* 07/04/2014 1332  2/22 CT Abd/Pelvis IMPRESSION: Possible inflammatory changes around the gallbladder. Correlate clinically for suspicion of cholecystitis. Cyst in the liver. No acute process otherwise identified.  2/22 CT Head IMPRESSION: 1. No acute intracranial findings or acute cervical spine findings. 2. Periventricular white matter and corona radiata hypodensities favor chronic ischemic microvascular white matter disease. 3. Right mastoid effusion with mild chronic right sphenoid sinusitis. 4. No significant hematoma is seen in the visualized portion of the the neck.  2/23 HIDA IMPRESSION:  Normal hepatobiliary scan. No evidence of cystic duct or biliary  obstruction   2/27 Bilat Hip/Lumbar XR IMPRESSION: Negative.  2/28 CXR IMPRESSION: 1. Mild  interstitial edema. In the setting of cardiomegaly this suggests early CHF. 2. Stable left basilar scarring versus chronic atelectasis. 3. No focal airspace consolidation to suggest pneumonia.   2/29 Abd US IMPRESSION: Probable tiny gallstone with distended gallbladder. No pericholecystic fluid or gallbladder wall thickening. The sonographer reports no sonographic Murphy sign.  Total Time: 60 minutes Greater than 50%  of this time was spent counseling and coordinating care related to the above assessment and plan.  Doran Clay D.O. Palliative Medicine Team at [facility]  Pager: [phone] Team Phone: [phone]             ;t

## 2014-09-07 ENCOUNTER — Telehealth: Payer: Self-pay

## 2014-09-07 NOTE — Telephone Encounter (Signed)
Patient's husband Mr. Benningfield called to cancel patient's appointment want's to let Dr. Laney Pastor know that she has passed

## 2014-09-11 ENCOUNTER — Ambulatory Visit: Payer: Self-pay | Admitting: Pharmacist Clinician (PhC)/ Clinical Pharmacy Specialist

## 2014-09-21 DEATH — deceased

## 2014-11-02 NOTE — Telephone Encounter (Signed)
Hold for Dr Laney Pastor

## 2015-05-29 IMAGING — DX DG CHEST 1V PORT
1 series · 1 of 1 positions shown · non-contrast
Comparison: Prior chest x-ray 08/13/2014

CLINICAL DATA: 86-year-old female with leukocytosis.

EXAM:
PORTABLE CHEST - 1 VIEW

[chest ap]
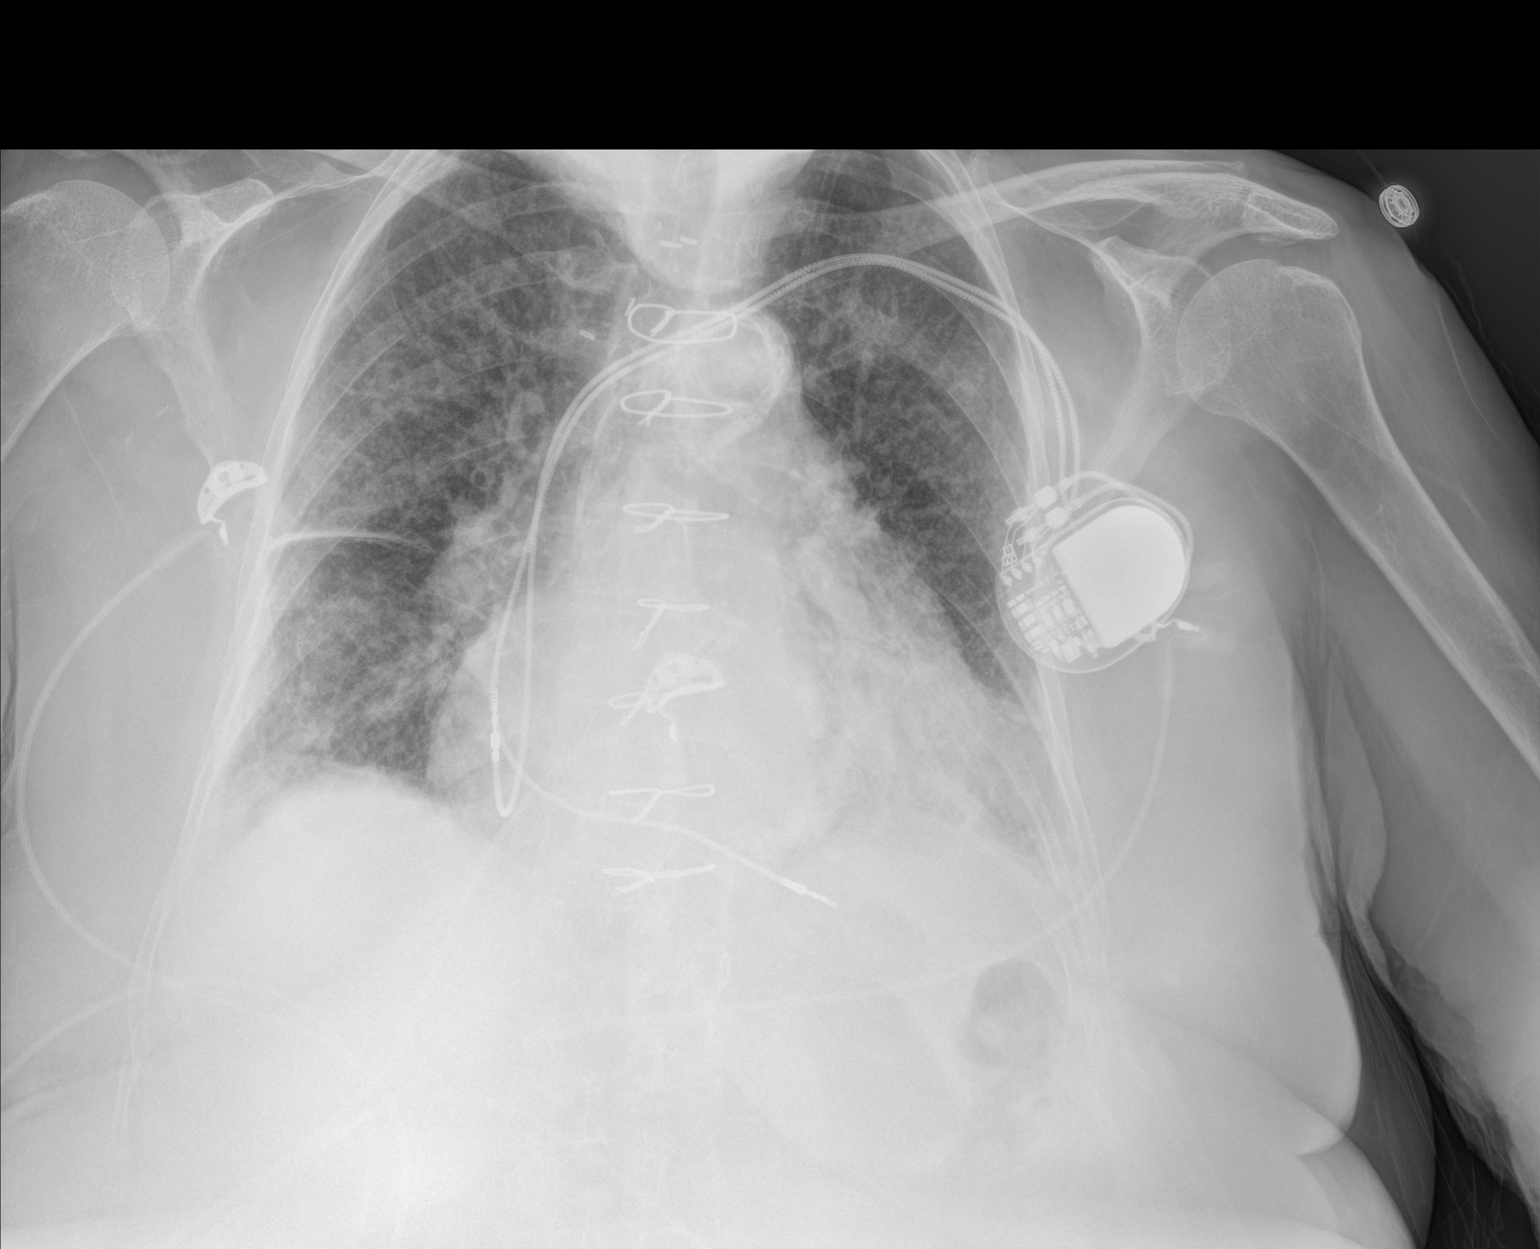

[1 of 1 positions shown; findings below may reference images not displayed]

FINDINGS: Stable cardiomegaly. Atherosclerotic and slightly tortuous thoracic
aorta. Stable position of left subclavian approach cardiac rhythm
maintenance device with leads projecting over the right atrium and
right ventricle. Slightly increased pulmonary vascular congestion
and interstitial prominence with fluid tracking along the minor
fissure suggesting mild interstitial edema. Unchanged linear
atelectasis versus chronic pleural parenchymal scarring in the left
base. No focal airspace consolidation, pleural effusion or
pneumothorax. No acute osseous abnormality.
IMPRESSION: 1. Mild interstitial edema. In the setting of cardiomegaly this
suggests early CHF.
2. Stable left basilar scarring versus chronic atelectasis.
3. No focal airspace consolidation to suggest pneumonia.
